# Patient Record
Sex: Female | Born: 1945 | Race: White | Hispanic: No | Smoking: Former smoker
Health system: Southern US, Community
[De-identification: ages and names within clinical notes are randomized; demographics above are authoritative.]

## PROBLEM LIST (undated history)

## (undated) DIAGNOSIS — M62830 Muscle spasm of back: Secondary | ICD-10-CM

## (undated) DIAGNOSIS — M1990 Unspecified osteoarthritis, unspecified site: Secondary | ICD-10-CM

## (undated) DIAGNOSIS — G8929 Other chronic pain: Secondary | ICD-10-CM

## (undated) DIAGNOSIS — F329 Major depressive disorder, single episode, unspecified: Secondary | ICD-10-CM

## (undated) DIAGNOSIS — Z973 Presence of spectacles and contact lenses: Secondary | ICD-10-CM

## (undated) DIAGNOSIS — I639 Cerebral infarction, unspecified: Secondary | ICD-10-CM

## (undated) DIAGNOSIS — M810 Age-related osteoporosis without current pathological fracture: Secondary | ICD-10-CM

## (undated) DIAGNOSIS — Z01810 Encounter for preprocedural cardiovascular examination: Secondary | ICD-10-CM

## (undated) DIAGNOSIS — I719 Aortic aneurysm of unspecified site, without rupture: Secondary | ICD-10-CM

## (undated) DIAGNOSIS — R0600 Dyspnea, unspecified: Secondary | ICD-10-CM

## (undated) DIAGNOSIS — I10 Essential (primary) hypertension: Secondary | ICD-10-CM

## (undated) DIAGNOSIS — N83209 Unspecified ovarian cyst, unspecified side: Secondary | ICD-10-CM

## (undated) DIAGNOSIS — F32A Depression, unspecified: Secondary | ICD-10-CM

## (undated) DIAGNOSIS — J3089 Other allergic rhinitis: Secondary | ICD-10-CM

## (undated) DIAGNOSIS — Z9989 Dependence on other enabling machines and devices: Secondary | ICD-10-CM

## (undated) DIAGNOSIS — I739 Peripheral vascular disease, unspecified: Secondary | ICD-10-CM

## (undated) DIAGNOSIS — IMO0002 Reserved for concepts with insufficient information to code with codable children: Secondary | ICD-10-CM

## (undated) DIAGNOSIS — J189 Pneumonia, unspecified organism: Secondary | ICD-10-CM

## (undated) DIAGNOSIS — E785 Hyperlipidemia, unspecified: Secondary | ICD-10-CM

## (undated) DIAGNOSIS — E119 Type 2 diabetes mellitus without complications: Secondary | ICD-10-CM

## (undated) DIAGNOSIS — G2581 Restless legs syndrome: Secondary | ICD-10-CM

## (undated) DIAGNOSIS — J439 Emphysema, unspecified: Secondary | ICD-10-CM

## (undated) DIAGNOSIS — F0390 Unspecified dementia without behavioral disturbance: Secondary | ICD-10-CM

## (undated) DIAGNOSIS — G709 Myoneural disorder, unspecified: Secondary | ICD-10-CM

## (undated) DIAGNOSIS — L601 Onycholysis: Secondary | ICD-10-CM

## (undated) DIAGNOSIS — M549 Dorsalgia, unspecified: Secondary | ICD-10-CM

## (undated) DIAGNOSIS — K439 Ventral hernia without obstruction or gangrene: Secondary | ICD-10-CM

## (undated) HISTORY — DX: Encounter for preprocedural cardiovascular examination: Z01.810

## (undated) HISTORY — DX: Unspecified ovarian cyst, unspecified side: N83.209

## (undated) HISTORY — DX: Emphysema, unspecified: J43.9

## (undated) HISTORY — PX: COLONOSCOPY: SHX174

## (undated) HISTORY — DX: Myoneural disorder, unspecified: G70.9

## (undated) HISTORY — PX: NECK SURGERY: SHX720

## (undated) HISTORY — DX: Age-related osteoporosis without current pathological fracture: M81.0

## (undated) HISTORY — DX: Hyperlipidemia, unspecified: E78.5

## (undated) HISTORY — DX: Other chronic pain: G89.29

## (undated) HISTORY — DX: Dorsalgia, unspecified: M54.9

## (undated) HISTORY — DX: Onycholysis: L60.1

## (undated) HISTORY — DX: Restless legs syndrome: G25.81

## (undated) HISTORY — DX: Muscle spasm of back: M62.830

## (undated) HISTORY — DX: Aortic aneurysm of unspecified site, without rupture: I71.9

---

## 1997-09-04 ENCOUNTER — Emergency Department (HOSPITAL_COMMUNITY): Admission: EM | Admit: 1997-09-04 | Discharge: 1997-09-04 | Payer: Self-pay | Admitting: Emergency Medicine

## 2002-05-15 ENCOUNTER — Ambulatory Visit (HOSPITAL_COMMUNITY): Admission: RE | Admit: 2002-05-15 | Discharge: 2002-05-15 | Payer: Self-pay | Admitting: Internal Medicine

## 2002-05-15 ENCOUNTER — Encounter: Payer: Self-pay | Admitting: Internal Medicine

## 2002-05-27 ENCOUNTER — Ambulatory Visit (HOSPITAL_COMMUNITY): Admission: RE | Admit: 2002-05-27 | Discharge: 2002-05-27 | Payer: Self-pay | Admitting: Internal Medicine

## 2002-05-27 ENCOUNTER — Encounter: Payer: Self-pay | Admitting: Internal Medicine

## 2003-10-09 ENCOUNTER — Ambulatory Visit: Payer: Self-pay | Admitting: *Deleted

## 2003-11-19 ENCOUNTER — Ambulatory Visit: Payer: Self-pay | Admitting: Nurse Practitioner

## 2003-12-08 ENCOUNTER — Ambulatory Visit: Payer: Self-pay | Admitting: Nurse Practitioner

## 2003-12-23 ENCOUNTER — Ambulatory Visit: Payer: Self-pay | Admitting: Nurse Practitioner

## 2004-04-08 ENCOUNTER — Ambulatory Visit (HOSPITAL_COMMUNITY): Admission: RE | Admit: 2004-04-08 | Discharge: 2004-04-08 | Payer: Self-pay | Admitting: *Deleted

## 2005-01-11 ENCOUNTER — Ambulatory Visit: Payer: Self-pay | Admitting: Nurse Practitioner

## 2005-02-08 ENCOUNTER — Ambulatory Visit (HOSPITAL_COMMUNITY): Admission: RE | Admit: 2005-02-08 | Discharge: 2005-02-08 | Payer: Self-pay | Admitting: Family Medicine

## 2005-03-21 ENCOUNTER — Encounter: Admission: RE | Admit: 2005-03-21 | Discharge: 2005-03-21 | Payer: Self-pay | Admitting: Nurse Practitioner

## 2005-09-06 ENCOUNTER — Ambulatory Visit: Payer: Self-pay | Admitting: Nurse Practitioner

## 2005-11-15 ENCOUNTER — Ambulatory Visit: Payer: Self-pay | Admitting: Internal Medicine

## 2005-11-16 ENCOUNTER — Encounter: Admission: RE | Admit: 2005-11-16 | Discharge: 2005-11-16 | Payer: Self-pay | Admitting: Internal Medicine

## 2006-05-29 ENCOUNTER — Ambulatory Visit: Payer: Self-pay | Admitting: Nurse Practitioner

## 2006-06-01 ENCOUNTER — Ambulatory Visit (HOSPITAL_COMMUNITY): Admission: RE | Admit: 2006-06-01 | Discharge: 2006-06-01 | Payer: Self-pay | Admitting: Family Medicine

## 2006-06-28 ENCOUNTER — Encounter (INDEPENDENT_AMBULATORY_CARE_PROVIDER_SITE_OTHER): Payer: Self-pay | Admitting: Nurse Practitioner

## 2006-06-28 ENCOUNTER — Ambulatory Visit: Payer: Self-pay | Admitting: Nurse Practitioner

## 2006-10-05 ENCOUNTER — Encounter: Admission: RE | Admit: 2006-10-05 | Discharge: 2006-10-05 | Payer: Self-pay | Admitting: Nurse Practitioner

## 2006-10-17 ENCOUNTER — Encounter (INDEPENDENT_AMBULATORY_CARE_PROVIDER_SITE_OTHER): Payer: Self-pay | Admitting: *Deleted

## 2006-12-26 ENCOUNTER — Ambulatory Visit: Payer: Self-pay | Admitting: Internal Medicine

## 2007-10-03 ENCOUNTER — Ambulatory Visit: Payer: Self-pay | Admitting: Internal Medicine

## 2007-10-03 ENCOUNTER — Encounter (INDEPENDENT_AMBULATORY_CARE_PROVIDER_SITE_OTHER): Payer: Self-pay | Admitting: Family Medicine

## 2007-10-03 LAB — CONVERTED CEMR LAB
Basophils Relative: 1 % (ref 0–1)
Creatinine, Ser: 0.78 mg/dL (ref 0.40–1.20)
Eosinophils Absolute: 0.1 10*3/uL (ref 0.0–0.7)
Eosinophils Relative: 1 % (ref 0–5)
Glucose, Bld: 90 mg/dL (ref 70–99)
Hemoglobin: 15.2 g/dL — ABNORMAL HIGH (ref 12.0–15.0)
Lymphocytes Relative: 30 % (ref 12–46)
Lymphs Abs: 2 10*3/uL (ref 0.7–4.0)
MCV: 96.1 fL (ref 78.0–100.0)
Monocytes Absolute: 0.6 10*3/uL (ref 0.1–1.0)
Monocytes Relative: 9 % (ref 3–12)
Platelets: 184 10*3/uL (ref 150–400)
RBC: 4.67 M/uL (ref 3.87–5.11)
Sodium: 142 meq/L (ref 135–145)
Vit D, 1,25-Dihydroxy: 31 (ref 30–89)
WBC: 6.9 10*3/uL (ref 4.0–10.5)

## 2007-10-17 ENCOUNTER — Ambulatory Visit (HOSPITAL_COMMUNITY): Admission: RE | Admit: 2007-10-17 | Discharge: 2007-10-17 | Payer: Self-pay | Admitting: Family Medicine

## 2007-11-07 ENCOUNTER — Ambulatory Visit: Payer: Self-pay | Admitting: Internal Medicine

## 2008-01-07 ENCOUNTER — Ambulatory Visit: Payer: Self-pay | Admitting: Internal Medicine

## 2008-01-28 ENCOUNTER — Ambulatory Visit: Payer: Self-pay | Admitting: Internal Medicine

## 2008-02-11 ENCOUNTER — Ambulatory Visit: Payer: Self-pay | Admitting: Internal Medicine

## 2008-02-11 ENCOUNTER — Encounter (INDEPENDENT_AMBULATORY_CARE_PROVIDER_SITE_OTHER): Payer: Self-pay | Admitting: Internal Medicine

## 2008-02-11 LAB — CONVERTED CEMR LAB
HDL: 67 mg/dL (ref >39)
LDL Cholesterol: 123 mg/dL — ABNORMAL HIGH (ref 0–99)
Total CHOL/HDL Ratio: 3.1
Triglycerides: 82 mg/dL (ref <150)
VLDL: 16 mg/dL (ref 0–40)

## 2008-04-07 ENCOUNTER — Encounter (INDEPENDENT_AMBULATORY_CARE_PROVIDER_SITE_OTHER): Payer: Self-pay | Admitting: Internal Medicine

## 2008-04-07 ENCOUNTER — Ambulatory Visit: Payer: Self-pay | Admitting: Family Medicine

## 2008-04-07 LAB — CONVERTED CEMR LAB
AST: 17 units/L (ref 0–37)
Albumin: 4.3 g/dL (ref 3.5–5.2)
Alkaline Phosphatase: 45 units/L (ref 39–117)
Calcium: 9.8 mg/dL (ref 8.4–10.5)
Glucose, Bld: 104 mg/dL — ABNORMAL HIGH (ref 70–99)
HDL: 61 mg/dL (ref >39)
Potassium: 4.2 meq/L (ref 3.5–5.3)
Sodium: 142 meq/L (ref 135–145)
Total Bilirubin: 0.5 mg/dL (ref 0.3–1.2)
Total CHOL/HDL Ratio: 2.6
Total Protein: 7 g/dL (ref 6.0–8.3)
VLDL: 19 mg/dL (ref 0–40)

## 2008-04-14 ENCOUNTER — Ambulatory Visit: Payer: Self-pay | Admitting: Internal Medicine

## 2008-06-16 ENCOUNTER — Ambulatory Visit: Payer: Self-pay | Admitting: Internal Medicine

## 2008-07-14 ENCOUNTER — Ambulatory Visit: Payer: Self-pay | Admitting: Internal Medicine

## 2008-08-31 ENCOUNTER — Ambulatory Visit: Payer: Self-pay | Admitting: Internal Medicine

## 2008-09-17 ENCOUNTER — Ambulatory Visit: Payer: Self-pay | Admitting: Internal Medicine

## 2008-10-20 ENCOUNTER — Encounter (INDEPENDENT_AMBULATORY_CARE_PROVIDER_SITE_OTHER): Payer: Self-pay | Admitting: Adult Health

## 2008-10-20 ENCOUNTER — Ambulatory Visit: Payer: Self-pay | Admitting: Internal Medicine

## 2008-10-20 LAB — CONVERTED CEMR LAB: Microalb, Ur: 2 mg/dL — ABNORMAL HIGH (ref 0.00–1.89)

## 2008-10-22 ENCOUNTER — Ambulatory Visit: Payer: Self-pay | Admitting: Internal Medicine

## 2008-10-22 ENCOUNTER — Encounter (INDEPENDENT_AMBULATORY_CARE_PROVIDER_SITE_OTHER): Payer: Self-pay | Admitting: Internal Medicine

## 2008-10-22 LAB — CONVERTED CEMR LAB
Calcium: 9.9 mg/dL (ref 8.4–10.5)
Vit D, 25-Hydroxy: 35 ng/mL (ref 30–89)

## 2008-11-03 ENCOUNTER — Ambulatory Visit: Payer: Self-pay | Admitting: Internal Medicine

## 2008-11-09 ENCOUNTER — Ambulatory Visit: Payer: Self-pay | Admitting: Internal Medicine

## 2008-11-09 ENCOUNTER — Encounter (INDEPENDENT_AMBULATORY_CARE_PROVIDER_SITE_OTHER): Payer: Self-pay | Admitting: Internal Medicine

## 2008-11-09 LAB — CONVERTED CEMR LAB
Basophils Absolute: 0 10*3/uL (ref 0.0–0.1)
Eosinophils Relative: 1 % (ref 0–5)
HCT: 45.5 % (ref 36.0–46.0)
Hemoglobin: 14.6 g/dL (ref 12.0–15.0)
Monocytes Absolute: 0.5 10*3/uL (ref 0.1–1.0)
Neutro Abs: 6.1 10*3/uL (ref 1.7–7.7)
Neutrophils Relative %: 70 % (ref 43–77)
Platelets: 187 10*3/uL (ref 150–400)

## 2008-11-24 ENCOUNTER — Ambulatory Visit (HOSPITAL_COMMUNITY): Admission: RE | Admit: 2008-11-24 | Discharge: 2008-11-24 | Payer: Self-pay | Admitting: Internal Medicine

## 2008-12-29 ENCOUNTER — Ambulatory Visit: Payer: Self-pay | Admitting: Internal Medicine

## 2009-01-19 ENCOUNTER — Ambulatory Visit: Payer: Self-pay | Admitting: Internal Medicine

## 2009-01-19 ENCOUNTER — Encounter (INDEPENDENT_AMBULATORY_CARE_PROVIDER_SITE_OTHER): Payer: Self-pay | Admitting: Internal Medicine

## 2009-01-19 LAB — CONVERTED CEMR LAB: LDL Cholesterol: 56 mg/dL (ref 0–99)

## 2009-01-28 ENCOUNTER — Ambulatory Visit: Payer: Self-pay | Admitting: Family Medicine

## 2009-01-30 DIAGNOSIS — I719 Aortic aneurysm of unspecified site, without rupture: Secondary | ICD-10-CM | POA: Insufficient documentation

## 2009-01-30 HISTORY — DX: Aortic aneurysm of unspecified site, without rupture: I71.9

## 2009-03-26 ENCOUNTER — Emergency Department (HOSPITAL_COMMUNITY): Admission: EM | Admit: 2009-03-26 | Discharge: 2009-03-26 | Payer: Self-pay | Admitting: Emergency Medicine

## 2009-03-29 ENCOUNTER — Ambulatory Visit: Payer: Self-pay | Admitting: Family Medicine

## 2009-03-31 ENCOUNTER — Ambulatory Visit (HOSPITAL_COMMUNITY): Admission: RE | Admit: 2009-03-31 | Discharge: 2009-03-31 | Payer: Self-pay | Admitting: Family Medicine

## 2009-04-08 ENCOUNTER — Ambulatory Visit: Payer: Self-pay | Admitting: Internal Medicine

## 2009-04-20 ENCOUNTER — Ambulatory Visit: Payer: Self-pay | Admitting: Vascular Surgery

## 2009-05-26 ENCOUNTER — Ambulatory Visit (HOSPITAL_COMMUNITY): Admission: RE | Admit: 2009-05-26 | Discharge: 2009-05-26 | Payer: Self-pay | Admitting: Internal Medicine

## 2009-06-01 DIAGNOSIS — N83209 Unspecified ovarian cyst, unspecified side: Secondary | ICD-10-CM

## 2009-06-01 HISTORY — DX: Unspecified ovarian cyst, unspecified side: N83.209

## 2009-06-10 ENCOUNTER — Ambulatory Visit: Payer: Self-pay | Admitting: Internal Medicine

## 2009-06-17 ENCOUNTER — Ambulatory Visit: Payer: Self-pay | Admitting: Internal Medicine

## 2009-06-17 LAB — CONVERTED CEMR LAB
CO2: 27 meq/L (ref 19–32)
Calcium: 9.7 mg/dL (ref 8.4–10.5)
Chloride: 101 meq/L (ref 96–112)
Creatinine, Ser: 0.85 mg/dL (ref 0.40–1.20)
Glucose, Bld: 105 mg/dL — ABNORMAL HIGH (ref 70–99)
Hgb A1c MFr Bld: 5.9 % — ABNORMAL HIGH (ref <5.7)

## 2009-06-30 ENCOUNTER — Ambulatory Visit (HOSPITAL_COMMUNITY): Admission: RE | Admit: 2009-06-30 | Discharge: 2009-06-30 | Payer: Self-pay | Admitting: Gastroenterology

## 2009-09-22 ENCOUNTER — Ambulatory Visit: Payer: Self-pay | Admitting: Family Medicine

## 2009-09-29 ENCOUNTER — Ambulatory Visit: Payer: Self-pay | Admitting: Internal Medicine

## 2009-12-07 ENCOUNTER — Ambulatory Visit: Payer: Self-pay | Admitting: Internal Medicine

## 2009-12-07 ENCOUNTER — Encounter (INDEPENDENT_AMBULATORY_CARE_PROVIDER_SITE_OTHER): Payer: Self-pay | Admitting: *Deleted

## 2009-12-07 LAB — CONVERTED CEMR LAB
ALT: 17 units/L (ref 0–35)
AST: 19 units/L (ref 0–37)
Albumin: 4.1 g/dL (ref 3.5–5.2)
Alkaline Phosphatase: 32 units/L — ABNORMAL LOW (ref 39–117)
Cholesterol: 141 mg/dL (ref 0–200)
Creatinine, Ser: 0.93 mg/dL (ref 0.40–1.20)
Total Bilirubin: 0.3 mg/dL (ref 0.3–1.2)
Total Protein: 6.4 g/dL (ref 6.0–8.3)

## 2010-02-19 ENCOUNTER — Encounter: Payer: Self-pay | Admitting: Internal Medicine

## 2010-04-22 LAB — CBC
HCT: 43.6 % (ref 36.0–46.0)
MCHC: 33 g/dL (ref 30.0–36.0)
MCV: 97.6 fL (ref 78.0–100.0)
Platelets: 179 10*3/uL (ref 150–400)
RDW: 13.6 % (ref 11.5–15.5)

## 2010-04-22 LAB — COMPREHENSIVE METABOLIC PANEL
ALT: 16 U/L (ref 0–35)
AST: 20 U/L (ref 0–37)
Albumin: 3.3 g/dL — ABNORMAL LOW (ref 3.5–5.2)
Alkaline Phosphatase: 33 U/L — ABNORMAL LOW (ref 39–117)
CO2: 27 mEq/L (ref 19–32)
Chloride: 103 mEq/L (ref 96–112)
GFR calc non Af Amer: >60 mL/min (ref >60)
Glucose, Bld: 106 mg/dL — ABNORMAL HIGH (ref 70–99)
Total Protein: 6.6 g/dL (ref 6.0–8.3)

## 2010-04-22 LAB — URINALYSIS, ROUTINE W REFLEX MICROSCOPIC
Bilirubin Urine: NEGATIVE
Glucose, UA: NEGATIVE mg/dL
Specific Gravity, Urine: 1.012 (ref 1.005–1.030)
pH: 7 (ref 5.0–8.0)

## 2010-04-22 LAB — DIFFERENTIAL
Basophils Absolute: 0.1 10*3/uL (ref 0.0–0.1)
Eosinophils Absolute: 0 10*3/uL (ref 0.0–0.7)
Eosinophils Relative: 0 % (ref 0–5)

## 2010-04-22 LAB — LIPASE, BLOOD: Lipase: 19 U/L (ref 11–59)

## 2010-06-14 NOTE — Consult Note (Signed)
NEW PATIENT CONSULTATION   Kylie Hurst, Kylie Hurst  DOB:  Jun 21, 1945                                       04/20/2009  XBJYN#:[id number]   The patient is a 65 year old female referred for a small abdominal  aortic aneurysm which was discovered on a recent CT scan for which she  was being evaluated for colitis and left flank discomfort in the [facility] emergency department.  These symptoms have resolved.  She states  that she was seen by Dr. Arbie Cookey about 6 years ago for an aneurysm at  which time the diameter was less than 3 cm.   CHRONIC MEDICAL PROBLEMS:  1. Lumbar disk disease.  2. Type 2 diabetes mellitus.  3. Hypertension.  4. Hyperlipidemia.  5. Tobacco abuse.  6. Colitis.   PAST SURGICAL HISTORY:  None.   FAMILY HISTORY:  Positive for diabetes in her mother and coronary artery  disease in her father who had coronary artery bypass grafting and  negative for stroke.   SOCIAL HISTORY:  She is a live in caregiver, has no children, has smoked  at least a pack of cigarettes per day for 44 years.  Does not use  alcohol.   REVIEW OF SYSTEMS:  Positive for chronic constipation, mild weight loss  recently.  No anorexia.  No chest pain.  Denies any neurologic or  musculoskeletal symptoms.  Has had a recent change in her vision.  All  other systems in review of systems are negative.   PHYSICAL EXAM:  Vital signs:  Blood pressure 120/76, heart rate 93,  temperature 97.9.  General:  She is a thin female who is in no apparent  distress, alert and oriented x3.  HEENT:  EOMs intact.  Conjunctivae  normal.  Lungs:  Clear to auscultation.  No rhonchi or wheezing.  Cardiovascular:  Regular rhythm.  No murmurs.  Carotid pulses 3+ with no  bruits.  There is no distal edema noted.  She has 3+ femoral, popliteal  and dorsalis pedis pulses bilaterally.  Abdomen:  Soft, nontender with  no masses.  Musculoskeletal:  Free of major deformities.  Neurological:  Normal with the  exception of some mild decreased sensation in her feet.  Skin:  Free of rashes.   I reviewed her CT of the abdomen performed on February 25 today and  agree that she has an infrarenal aortic aneurysm with a maximum diameter  of 37 mm.  There is no evidence of any leakage or dissection.  This  needs to be followed every 2 years with an ultrasound and we will  arrange for protocol followup in our office.  No treatment is necessary  at this time.  I discussed this at length with the patient.     Quita Skye Hart Rochester, M.D.  Electronically Signed   JDL/MEDQ  D:  04/20/2009  T:  04/21/2009  Job:  [id number]   cc:   Dr Brita Romp C. Reche Dixon, M.D.

## 2011-04-03 ENCOUNTER — Other Ambulatory Visit: Payer: Self-pay | Admitting: *Deleted

## 2011-04-03 DIAGNOSIS — I714 Abdominal aortic aneurysm, without rupture: Secondary | ICD-10-CM

## 2011-06-13 ENCOUNTER — Encounter (INDEPENDENT_AMBULATORY_CARE_PROVIDER_SITE_OTHER): Payer: Medicare Other | Admitting: *Deleted

## 2011-06-13 DIAGNOSIS — I714 Abdominal aortic aneurysm, without rupture: Secondary | ICD-10-CM

## 2011-06-28 ENCOUNTER — Other Ambulatory Visit: Payer: Self-pay | Admitting: *Deleted

## 2011-06-28 ENCOUNTER — Encounter: Payer: Self-pay | Admitting: Vascular Surgery

## 2011-06-28 DIAGNOSIS — I714 Abdominal aortic aneurysm, without rupture: Secondary | ICD-10-CM

## 2011-06-28 NOTE — Procedures (Unsigned)
DUPLEX ULTRASOUND OF ABDOMINAL AORTA  INDICATION:  Followup AAA.  HISTORY: Diabetes:  Yes Cardiac:  No Hypertension:  Yes Smoking:  Yes Connective Tissue Disorder: Family History:  No Previous Surgery:  No  DUPLEX EXAM:         AP (cm)                   TRANSVERSE (cm) Proximal             2.03 cm                   2.03 cm Mid                  3.7 cm                    3.7 cm Distal               NV                        NV Right Iliac          0.86 cm Left Iliac           0.81 cm  Previous:  (by CT) :  3.7 cm AP  IMPRESSION:  Apparently stable AAA, measuring 3.7 cm x 3.7 cm on today's exam.  ___________________________________________ Quita Skye Hart [place], M.D.  LT/MEDQ  D:  06/13/2011  T:  06/14/2011  Job:  [id number]

## 2011-07-08 ENCOUNTER — Ambulatory Visit (INDEPENDENT_AMBULATORY_CARE_PROVIDER_SITE_OTHER): Payer: Medicaid Other | Admitting: Family Medicine

## 2011-07-08 ENCOUNTER — Ambulatory Visit: Payer: Medicare Other

## 2011-07-08 VITALS — BP 127/79 | HR 83 | Temp 98.2°F | Resp 18 | Ht <= 58 in | Wt 119.2 lb

## 2011-07-08 DIAGNOSIS — F17200 Nicotine dependence, unspecified, uncomplicated: Secondary | ICD-10-CM

## 2011-07-08 DIAGNOSIS — J40 Bronchitis, not specified as acute or chronic: Secondary | ICD-10-CM

## 2011-07-08 MED ORDER — AMOXICILLIN 875 MG PO TABS: 875.0000 mg | ORAL_TABLET | Freq: Two times a day (BID) | ORAL | Status: AC

## 2011-07-08 MED ORDER — BENZONATATE 100 MG PO CAPS: 100.0000 mg | ORAL_CAPSULE | Freq: Two times a day (BID) | ORAL | Status: AC | PRN

## 2011-07-08 NOTE — Progress Notes (Signed)
Subjective: Is 66-year-old lady with a history of being sick for the female patient in no major distress at this time. ast couple days with a cough. She bringing up a lot of green sputum. She has been a smoker for a long time, now smoking about a pack of cigarettes a day. She has not been terribly ill with this. Not running a fever. She has not been around anyone else with similar symptoms. She does have diabetes, is on Januvia. She is also on medicine for her osteoporosis, cholesterol, blood pressure, muscle relaxant, and pain.  Objective:  TMs normal. Has some cerumen buildup in the right ear. Throat clear. Neck supple without significant nodes. Chest has somewhat decreased air exchanged. There is a tiny in expiratory wheeze on forced expiration. Heart regular without murmurs. No ankle edema  Assessment: Bronchitis Cigarette smoker Diabetes  Plan: Check chest x-ray  [facility] reading (PRIMARY) by  Dr. Alwyn Ren Copd . Urged patient to quit smoking. Discussed with her the methodology for doing so.

## 2011-07-08 NOTE — Patient Instructions (Addendum)
STOP SMOKING!  If worse cough, shortness of breath, or illness return for recheck.

## 2011-12-20 ENCOUNTER — Other Ambulatory Visit (HOSPITAL_COMMUNITY): Payer: Self-pay | Admitting: Specialist

## 2011-12-20 DIAGNOSIS — Z1231 Encounter for screening mammogram for malignant neoplasm of breast: Secondary | ICD-10-CM

## 2011-12-24 ENCOUNTER — Emergency Department (HOSPITAL_COMMUNITY): Payer: Medicare Other

## 2011-12-24 ENCOUNTER — Encounter (HOSPITAL_COMMUNITY): Payer: Self-pay | Admitting: Emergency Medicine

## 2011-12-24 ENCOUNTER — Emergency Department (HOSPITAL_COMMUNITY)
Admission: EM | Admit: 2011-12-24 | Discharge: 2011-12-24 | Disposition: A | Discharge status: Home / Self Care | Payer: Medicare Other | Attending: Emergency Medicine | Admitting: Emergency Medicine

## 2011-12-24 DIAGNOSIS — Z760 Encounter for issue of repeat prescription: Secondary | ICD-10-CM | POA: Insufficient documentation

## 2011-12-24 DIAGNOSIS — J40 Bronchitis, not specified as acute or chronic: Secondary | ICD-10-CM | POA: Insufficient documentation

## 2011-12-24 DIAGNOSIS — I10 Essential (primary) hypertension: Secondary | ICD-10-CM | POA: Insufficient documentation

## 2011-12-24 DIAGNOSIS — J3489 Other specified disorders of nose and nasal sinuses: Secondary | ICD-10-CM | POA: Insufficient documentation

## 2011-12-24 DIAGNOSIS — E119 Type 2 diabetes mellitus without complications: Secondary | ICD-10-CM | POA: Insufficient documentation

## 2011-12-24 DIAGNOSIS — F17200 Nicotine dependence, unspecified, uncomplicated: Secondary | ICD-10-CM | POA: Insufficient documentation

## 2011-12-24 DIAGNOSIS — Z7982 Long term (current) use of aspirin: Secondary | ICD-10-CM | POA: Insufficient documentation

## 2011-12-24 DIAGNOSIS — Z79899 Other long term (current) drug therapy: Secondary | ICD-10-CM | POA: Insufficient documentation

## 2011-12-24 HISTORY — DX: Essential (primary) hypertension: I10

## 2011-12-24 HISTORY — DX: Type 2 diabetes mellitus without complications: E11.9

## 2011-12-24 MED ORDER — DOXYCYCLINE HYCLATE 100 MG PO CAPS: 100.0000 mg | ORAL_CAPSULE | Freq: Two times a day (BID) | ORAL | Status: DC

## 2011-12-24 MED ORDER — BENZONATATE 100 MG PO CAPS: 100.0000 mg | ORAL_CAPSULE | Freq: Three times a day (TID) | ORAL | Status: DC | PRN

## 2011-12-24 NOTE — ED Provider Notes (Signed)
History     CSN: [id number]  Arrival date & time 12/24/11  1641   First MD Initiated Contact with Patient 12/24/11 1804      Chief Complaint  Patient presents with  . Cough  . Nasal Congestion    (Consider location/radiation/quality/duration/timing/severity/associated sxs/prior treatment) HPI  Patient reports 4 days ago she started having nasal stuffiness then a scratchy throat and now she is having a cough with yellow sputum production. She denies fever, shortness of breath, wheezing, or chest pain. No DOE.  She states when she coughs her abdominal muscles are sore. She denies nausea, vomiting or diarrhea. States she has been taking Robitussin that her sister gave her.  PCP Dr. Lerry Liner  Past Medical History  Diagnosis Date  . Diabetes mellitus without complication   . Hypertension     History reviewed. No pertinent past surgical history.  No family history on file.  History  Substance Use Topics  . Smoking status: Current Every Day Smoker -- 1.0 packs/day for 47 years    Types: Cigarettes  . Smokeless tobacco: Never Used  . Alcohol Use: No  Lives at home Lives alone retired  OB History    Grav Para Term Preterm Abortions TAB SAB Ect Mult Living                  Review of Systems  All other systems reviewed and are negative.    Allergies  Review of patient's allergies indicates no known allergies.  Home Medications   Current Outpatient Rx  Name  Route  Sig  Dispense  Refill  . ACETAMINOPHEN 500 MG PO TABS   Oral   Take 500 mg by mouth 3 (three) times daily.         . ALENDRONATE SODIUM 70 MG PO TABS   Oral   Take 70 mg by mouth every 7 (seven) days. Take with a full glass of water on an empty stomach.         Marland Kitchen VITAMIN C 1000 MG PO TABS   Oral   Take 1,000 mg by mouth daily.         . ASPIRIN 81 MG PO TABS   Oral   Take 81 mg by mouth daily.         Marland Kitchen CALCIUM CARBONATE-VITAMIN D 500-200 MG-UNIT PO TABS   Oral   Take 1  tablet by mouth 2 (two) times daily.         Marland Kitchen CETIRIZINE HCL 10 MG PO TABS   Oral   Take 10 mg by mouth daily.         . CYCLOBENZAPRINE HCL 10 MG PO TABS   Oral   Take 10 mg by mouth 3 (three) times daily as needed.         Marland Kitchen DOCUSATE SODIUM 250 MG PO CAPS   Oral   Take 250 mg by mouth daily.         Marland Kitchen HYDROCHLOROTHIAZIDE 25 MG PO TABS   Oral   Take 12.5 mg by mouth daily.          Marland Kitchen LISINOPRIL 5 MG PO TABS   Oral   Take 5 mg by mouth daily.         . MULTI-VITAMIN/MINERALS PO TABS   Oral   Take 1 tablet by mouth daily.         Marland Kitchen NAPROXEN 500 MG PO TABS   Oral   Take 500 mg by mouth 2 (two)  times daily with a meal.         . PRAVASTATIN SODIUM 20 MG PO TABS   Oral   Take 20 mg by mouth daily.         Marland Kitchen VITAMIN B-6 100 MG PO TABS   Oral   Take 100 mg by mouth daily.         Marland Kitchen SITAGLIPTIN PHOSPHATE 50 MG PO TABS   Oral   Take 50 mg by mouth daily.         . TRAMADOL HCL 50 MG PO TABS   Oral   Take 50 mg by mouth every 8 (eight) hours as needed.           BP 119/69  Pulse 90  Temp 99.8 F (37.7 C) (Oral)  Resp 18  SpO2 96%  Vital signs normal    Physical Exam  Nursing note and vitals reviewed. Constitutional: She is oriented to person, place, and time. She appears well-developed and well-nourished.  Non-toxic appearance. She does not appear ill. No distress.       Coughs at times  HENT:  Head: Normocephalic and atraumatic.  Right Ear: External ear normal.  Left Ear: External ear normal.  Nose: Nose normal. No mucosal edema or rhinorrhea.  Mouth/Throat: Oropharynx is clear and moist and mucous membranes are normal. No dental abscesses or uvula swelling.  Eyes: Conjunctivae normal and EOM are normal. Pupils are equal, round, and reactive to light.  Neck: Normal range of motion and full passive range of motion without pain. Neck supple.  Cardiovascular: Normal rate, regular rhythm and normal heart sounds.  Exam reveals no  gallop and no friction rub.   No murmur heard. Pulmonary/Chest: Effort normal and breath sounds normal. No respiratory distress. She has no wheezes. She has no rhonchi. She has no rales. She exhibits no tenderness and no crepitus.       Good air movement  Abdominal: Soft. Normal appearance and bowel sounds are normal. She exhibits no distension. There is no tenderness. There is no rebound and no guarding.  Musculoskeletal: Normal range of motion. She exhibits no edema and no tenderness.       Moves all extremities well.   Neurological: She is alert and oriented to person, place, and time. She has normal strength. No cranial nerve deficit.  Skin: Skin is warm, dry and intact. No rash noted. No erythema. No pallor.  Psychiatric: She has a normal mood and affect. Her speech is normal and behavior is normal. Her mood appears not anxious.    ED Course  Procedures (including critical care time)  Patient concerned that she has changes of emphysema on her chest x-ray. We discussed trying to quit smoking.  Dg Chest 2 View  12/24/2011  *RADIOLOGY REPORT*  Clinical Data: Cough, congestion and fever.  CHEST - 2 VIEW  Comparison: PA and lateral chest 07/08/2011.  Findings: The lungs are emphysematous with basilar scar.  No consolidative process, pneumothorax or effusion.  Heart size normal.  IMPRESSION: Emphysema without acute disease.   Original Report Authenticated By: Holley Dexter, M.D.      1. Bronchitis     New Prescriptions   BENZONATATE (TESSALON) 100 MG CAPSULE    Take 1 capsule (100 mg total) by mouth 3 (three) times daily as needed for cough (DO NOT CHEW!!!).   DOXYCYCLINE (VIBRAMYCIN) 100 MG CAPSULE    Take 1 capsule (100 mg total) by mouth 2 (two) times daily.    Plan discharge  Devoria Albe, MD, FACEP   MDM          Ward Givens, MD 12/24/11 [phone]

## 2011-12-24 NOTE — ED Notes (Signed)
Presents w/ nasal congestion, onset Wednesday, has now progressed to productive (milky yellow) cough, w/ low grade temp. Pt denies headache, sorethroat or ear pain. Is having abdominal pain when coughing.

## 2011-12-24 NOTE — ED Notes (Signed)
MD at bedside. Dr. Knapp at bedside.  

## 2012-01-10 ENCOUNTER — Ambulatory Visit (HOSPITAL_COMMUNITY)
Admission: RE | Admit: 2012-01-10 | Discharge: 2012-01-10 | Disposition: A | Discharge status: Home / Self Care | Payer: Medicare Other | Source: Ambulatory Visit | Attending: Specialist | Admitting: Specialist

## 2012-01-10 DIAGNOSIS — Z1231 Encounter for screening mammogram for malignant neoplasm of breast: Secondary | ICD-10-CM

## 2012-04-04 ENCOUNTER — Other Ambulatory Visit (HOSPITAL_COMMUNITY): Payer: Self-pay | Admitting: Specialist

## 2012-04-04 DIAGNOSIS — N83201 Unspecified ovarian cyst, right side: Secondary | ICD-10-CM

## 2012-04-09 ENCOUNTER — Other Ambulatory Visit (HOSPITAL_COMMUNITY): Payer: Self-pay | Admitting: Specialist

## 2012-04-09 ENCOUNTER — Ambulatory Visit (HOSPITAL_COMMUNITY)
Admission: RE | Admit: 2012-04-09 | Discharge: 2012-04-09 | Disposition: A | Discharge status: Home / Self Care | Payer: Medicare Other | Source: Ambulatory Visit | Attending: Specialist | Admitting: Specialist

## 2012-04-09 DIAGNOSIS — N83201 Unspecified ovarian cyst, right side: Secondary | ICD-10-CM

## 2012-04-09 DIAGNOSIS — R109 Unspecified abdominal pain: Secondary | ICD-10-CM | POA: Insufficient documentation

## 2012-04-09 DIAGNOSIS — Z09 Encounter for follow-up examination after completed treatment for conditions other than malignant neoplasm: Secondary | ICD-10-CM | POA: Insufficient documentation

## 2012-04-09 DIAGNOSIS — N83209 Unspecified ovarian cyst, unspecified side: Secondary | ICD-10-CM | POA: Insufficient documentation

## 2012-04-19 ENCOUNTER — Other Ambulatory Visit (HOSPITAL_COMMUNITY): Payer: Self-pay | Admitting: Specialist

## 2012-04-19 DIAGNOSIS — N83209 Unspecified ovarian cyst, unspecified side: Secondary | ICD-10-CM

## 2012-04-23 ENCOUNTER — Ambulatory Visit (HOSPITAL_COMMUNITY): Payer: Medicare Other

## 2012-04-30 ENCOUNTER — Ambulatory Visit (HOSPITAL_COMMUNITY)
Admission: RE | Admit: 2012-04-30 | Discharge: 2012-04-30 | Disposition: A | Discharge status: Home / Self Care | Payer: 59 | Source: Ambulatory Visit | Attending: Specialist | Admitting: Specialist

## 2012-04-30 ENCOUNTER — Encounter (HOSPITAL_COMMUNITY): Payer: Self-pay

## 2012-04-30 DIAGNOSIS — N949 Unspecified condition associated with female genital organs and menstrual cycle: Secondary | ICD-10-CM | POA: Insufficient documentation

## 2012-04-30 DIAGNOSIS — I714 Abdominal aortic aneurysm, without rupture, unspecified: Secondary | ICD-10-CM | POA: Insufficient documentation

## 2012-04-30 DIAGNOSIS — K439 Ventral hernia without obstruction or gangrene: Secondary | ICD-10-CM | POA: Insufficient documentation

## 2012-04-30 DIAGNOSIS — N83209 Unspecified ovarian cyst, unspecified side: Secondary | ICD-10-CM

## 2012-04-30 MED ORDER — IOHEXOL 300 MG/ML  SOLN
100.0000 mL | Freq: Once | INTRAMUSCULAR | Status: AC | PRN
  Administered 2012-04-30: 100 mL via INTRAVENOUS

## 2012-05-14 ENCOUNTER — Encounter (INDEPENDENT_AMBULATORY_CARE_PROVIDER_SITE_OTHER): Payer: Self-pay | Admitting: General Surgery

## 2012-05-14 ENCOUNTER — Ambulatory Visit (INDEPENDENT_AMBULATORY_CARE_PROVIDER_SITE_OTHER): Payer: Medicare Other | Admitting: General Surgery

## 2012-05-14 VITALS — BP 146/74 | HR 82 | Resp 18 | Ht <= 58 in | Wt 118.0 lb

## 2012-05-14 DIAGNOSIS — K439 Ventral hernia without obstruction or gangrene: Secondary | ICD-10-CM

## 2012-05-14 NOTE — Progress Notes (Signed)
Patient ID: Kylie Hurst, female   DOB: Apr 11, 1945, 67 y.o.   MRN: [id number]  Chief Complaint  Patient presents with  . Other    Eval Ventral Hernia    HPI Kylie Hurst is a 67 y.o. female.  This patient was referred by Dr. Mayford Knife for evaluation of a left lower quadrant abdominal bulge and ventral hernia. She says that she first noticed this about 2 years ago but it has not caused any discomfort. She says that the knot was the size of a baseball and comes and goes and reduces spontaneously when lying flat.  It is not causing discomfort and she has no associated nausea or vomiting. She moves her bowels daily. She also has a known AAA which is just been followed with imaging for now. She continues to smoke. HPI  Past Medical History  Diagnosis Date  . Diabetes mellitus without complication   . Hypertension   . Aortic aneurysm 2011    3.54mm  . Ovarian cyst 06/01/2009    5 cm  . Onycholysis of toenail   . Osteoporosis   . Chronic back pain   . Restless legs   . Back spasm     No past surgical history on file.  No family history on file.  Social History History  Substance Use Topics  . Smoking status: Current Every Day Smoker -- 1.00 packs/day for 47 years    Types: Cigarettes  . Smokeless tobacco: Never Used  . Alcohol Use: No    No Known Allergies  Current Outpatient Prescriptions  Medication Sig Dispense Refill  . acetaminophen (TYLENOL) 500 MG tablet Take 500 mg by mouth 3 (three) times daily.      Marland Kitchen alendronate (FOSAMAX) 70 MG tablet Take 70 mg by mouth every 7 (seven) days. Take with a full glass of water on an empty stomach.      . Ascorbic Acid (VITAMIN C) 1000 MG tablet Take 1,000 mg by mouth daily.      Marland Kitchen aspirin 81 MG tablet Take 81 mg by mouth daily.      . benzonatate (TESSALON) 100 MG capsule Take 1 capsule (100 mg total) by mouth 3 (three) times daily as needed for cough (DO NOT CHEW!!!).  21 capsule  0  . calcium-vitamin D (OSCAL WITH D) 500-200 MG-UNIT  per tablet Take 1 tablet by mouth 2 (two) times daily.      . cyclobenzaprine (FLEXERIL) 10 MG tablet Take 10 mg by mouth 3 (three) times daily as needed.      . docusate sodium (COLACE) 250 MG capsule Take 250 mg by mouth daily.      . hydrochlorothiazide (HYDRODIURIL) 25 MG tablet Take 12.5 mg by mouth daily.       Marland Kitchen lisinopril (PRINIVIL,ZESTRIL) 5 MG tablet Take 5 mg by mouth daily.      . Multiple Vitamins-Minerals (MULTIVITAMIN WITH MINERALS) tablet Take 1 tablet by mouth daily.      . naproxen (NAPROSYN) 500 MG tablet Take 500 mg by mouth 2 (two) times daily with a meal.      . pravastatin (PRAVACHOL) 20 MG tablet Take 20 mg by mouth daily.      Marland Kitchen pyridOXINE (VITAMIN B-6) 100 MG tablet Take 100 mg by mouth daily.      . sitaGLIPtin (JANUVIA) 50 MG tablet Take 50 mg by mouth daily.      . traMADol (ULTRAM) 50 MG tablet Take 50 mg by mouth every 8 (eight) hours as needed.  No current facility-administered medications for this visit.    Review of Systems Review of Systems All other review of systems negative or noncontributory except as stated in the HPI  Blood pressure 146/74, pulse 82, resp. rate 18, height 4' 9.5" (1.461 m), weight 118 lb (53.524 kg).  Physical Exam Physical Exam Physical Exam  Nursing note and vitals reviewed. Constitutional: She is oriented to person, place, and time. She appears well-developed and well-nourished. No distress.  HENT:  Head: Normocephalic and atraumatic.  Mouth/Throat: No oropharyngeal exudate.  Eyes: Conjunctivae and EOM are normal. Pupils are equal, round, and reactive to light. Right eye exhibits no discharge. Left eye exhibits no discharge. No scleral icterus.  Neck: Normal range of motion. Neck supple. No tracheal deviation present.  Cardiovascular: Normal rate, regular rhythm, normal heart sounds and intact distal pulses.   Pulmonary/Chest: Effort normal and breath sounds normal. No stridor. No respiratory distress. She has no  wheezes.  Abdominal: Soft. Bowel sounds are normal. She exhibits no distension. There is no tenderness. There is no rebound and no guarding. Reducible LLQ bulge, likely spigelian hernia.  Does not appear to be an inguinal hernia. Musculoskeletal: Normal range of motion. She exhibits no edema and no tenderness.  Neurological: She is alert and oriented to person, place, and time.  Skin: Skin is warm and dry. No rash noted. She is not diaphoretic. No erythema. No pallor.  Psychiatric: She has a normal mood and affect. Her behavior is normal. Judgment and thought content normal.    Data Reviewed [place], ct  Assessment    Left lower quadrant ventral hernia-reducible She has an asymptomatic and reducible ventral hernia which is likely a spigelian hernia given its location and the lack of prior abdominal surgery. I do not think that this is an inguinal hernia. She remains asymptomatic. I discussed with her the option for ventral hernia repair and I think that she would be best served with an open preperitoneal ventral hernia repair with mesh however she is a current smoker and we discussed the risks of the surgery and explained that she is high risk for complications due to her smoking. Since she is asymptomatic I recommended that she attempt smoking cessation prior to repair.  I did offer repair of if this is symptomatic for any signs of incarceration or obstruction, however, again since this is asymptomatic currently I think it would be best to receive her smoking cessation prior to elective repair. I did discuss with her the risk of possible incarceration and strangulation and the possible need for emergent surgery and she expressed understanding of this but she agreed to plan an she is planning on contacting her primary physician to evaluate for possible smoking cessation aids.  I gave her my card that information if this becomes symptomatic or any signs of incarceration or obstruction, she will call me back  and we can discuss scheduling her for repair sooner    Plan    Smoking cessation I will see her back in one month to see her she is doing and to discuss operative repair again        Lodema Pilot DAVID 05/14/2012, 10:02 AM

## 2012-05-22 ENCOUNTER — Encounter (INDEPENDENT_AMBULATORY_CARE_PROVIDER_SITE_OTHER): Payer: Self-pay

## 2012-06-11 ENCOUNTER — Ambulatory Visit: Payer: Medicare Other | Admitting: Neurosurgery

## 2012-06-14 ENCOUNTER — Encounter (INDEPENDENT_AMBULATORY_CARE_PROVIDER_SITE_OTHER): Payer: Medicaid Other | Admitting: *Deleted

## 2012-06-14 ENCOUNTER — Encounter (INDEPENDENT_AMBULATORY_CARE_PROVIDER_SITE_OTHER): Payer: Medicare Other | Admitting: General Surgery

## 2012-06-14 DIAGNOSIS — I714 Abdominal aortic aneurysm, without rupture: Secondary | ICD-10-CM

## 2012-06-18 ENCOUNTER — Other Ambulatory Visit: Payer: Self-pay | Admitting: *Deleted

## 2012-06-18 DIAGNOSIS — I714 Abdominal aortic aneurysm, without rupture: Secondary | ICD-10-CM

## 2012-06-21 ENCOUNTER — Encounter (INDEPENDENT_AMBULATORY_CARE_PROVIDER_SITE_OTHER): Payer: Medicare Other | Admitting: General Surgery

## 2012-07-01 ENCOUNTER — Encounter: Payer: Self-pay | Admitting: Vascular Surgery

## 2012-07-02 ENCOUNTER — Ambulatory Visit (INDEPENDENT_AMBULATORY_CARE_PROVIDER_SITE_OTHER): Payer: Medicare Other | Admitting: Vascular Surgery

## 2012-07-02 ENCOUNTER — Encounter: Payer: Self-pay | Admitting: Vascular Surgery

## 2012-07-02 ENCOUNTER — Other Ambulatory Visit: Payer: Self-pay | Admitting: *Deleted

## 2012-07-02 VITALS — BP 132/87 | HR 83 | Resp 16 | Ht <= 58 in | Wt 118.0 lb

## 2012-07-02 DIAGNOSIS — I714 Abdominal aortic aneurysm, without rupture: Secondary | ICD-10-CM

## 2012-07-03 DIAGNOSIS — I714 Abdominal aortic aneurysm, without rupture, unspecified: Secondary | ICD-10-CM

## 2012-07-03 HISTORY — DX: Abdominal aortic aneurysm, without rupture, unspecified: I71.40

## 2012-07-03 NOTE — Progress Notes (Signed)
Subjective:     Patient ID: Kylie Hurst, female   DOB: 1945/06/27, 67 y.o.   MRN: [id number]  HPI this 67 year old female was seen for followup regarding abdominal aortic aneurysm. She was seen 2 years ago with the aneurysm and 3.7 cm range. She had an ultrasound performed in April of this year which revealed the aneurysm to be 4.1 cm. Today she returns for followup. She denies any abdominal or back symptoms.   Past Medical History  Diagnosis Date  . Diabetes mellitus without complication   . Hypertension   . Aortic aneurysm 2011    3.39mm  . Ovarian cyst 06/01/2009    5 cm  . Onycholysis of toenail   . Osteoporosis   . Chronic back pain   . Restless legs   . Back spasm   . Emphysema     History  Substance Use Topics  . Smoking status: Former Smoker -- 1.00 packs/day for 47 years    Types: Cigarettes    Quit date: 05/30/2012  . Smokeless tobacco: Never Used  . Alcohol Use: No    History reviewed. No pertinent family history.  No Known Allergies  Current outpatient prescriptions:acetaminophen (TYLENOL) 500 MG tablet, Take 500 mg by mouth 3 (three) times daily., Disp: , Rfl: ;  alendronate (FOSAMAX) 70 MG tablet, Take 70 mg by mouth every 7 (seven) days. Take with a full glass of water on an empty stomach., Disp: , Rfl: ;  Ascorbic Acid (VITAMIN C) 1000 MG tablet, Take 2,000 mg by mouth daily. , Disp: , Rfl: ;  aspirin 81 MG tablet, Take 81 mg by mouth daily., Disp: , Rfl:  calcium-vitamin D (OSCAL WITH D) 500-200 MG-UNIT per tablet, Take 1 tablet by mouth 2 (two) times daily., Disp: , Rfl: ;  cyclobenzaprine (FLEXERIL) 10 MG tablet, Take 10 mg by mouth 3 (three) times daily as needed., Disp: , Rfl: ;  docusate sodium (COLACE) 250 MG capsule, Take 500 mg by mouth daily. , Disp: , Rfl: ;  hydrochlorothiazide (HYDRODIURIL) 25 MG tablet, Take 12.5 mg by mouth daily. , Disp: , Rfl:  lisinopril (PRINIVIL,ZESTRIL) 5 MG tablet, Take 5 mg by mouth daily., Disp: , Rfl: ;  Multiple  Vitamins-Minerals (MULTIVITAMIN WITH MINERALS) tablet, Take 1 tablet by mouth daily., Disp: , Rfl: ;  naproxen (NAPROSYN) 500 MG tablet, Take 500 mg by mouth 2 (two) times daily with a meal., Disp: , Rfl: ;  pravastatin (PRAVACHOL) 20 MG tablet, Take 20 mg by mouth daily., Disp: , Rfl:  pyridOXINE (VITAMIN B-6) 100 MG tablet, Take 100 mg by mouth daily., Disp: , Rfl: ;  sitaGLIPtin (JANUVIA) 50 MG tablet, Take 50 mg by mouth daily., Disp: , Rfl: ;  traMADol (ULTRAM) 50 MG tablet, Take 50 mg by mouth every 8 (eight) hours as needed., Disp: , Rfl: ;  benzonatate (TESSALON) 100 MG capsule, Take 1 capsule (100 mg total) by mouth 3 (three) times daily as needed for cough (DO NOT CHEW!!!)., Disp: 21 capsule, Rfl: 0  BP 132/87  Pulse 83  Resp 16  Ht 4' 9.5" (1.461 m)  Wt 118 lb (53.524 kg)  BMI 25.08 kg/m2  Body mass index is 25.08 kg/(m^2).        Review of Systems denies chest pain but does have dyspnea on exertion, back pain, joint pain. No hemoptysis, PND, orthopnea.    Objective:   Physical Exam blood pressure 132/87 heart rate 83 respirations 16 Gen.-alert and oriented x3 in no apparent distress  HEENT normal for age Lungs no rhonchi or wheezing Cardiovascular regular rhythm no murmurs carotid pulses 3+ palpable no bruits audible Abdomen soft nontender no palpable masses Musculoskeletal free of  major deformities Skin clear -no rashes Neurologic normal Lower extremities 3+ femoral and dorsalis pedis pulses palpable bilaterally with no edema  Today I ordered a duplex scan of the aortic aneurysm for comparison sake. Our measurements were 3.7 cm maximum diameter of the aortic aneurysm.       Assessment:     3.7 cm abdominal aortic aneurysm-stable-asymptomatic    Plan:     Return in 2 years for repeat duplex scan of abdominal aortic aneurysm

## 2012-11-05 ENCOUNTER — Encounter (HOSPITAL_COMMUNITY): Payer: Self-pay | Admitting: *Deleted

## 2012-11-05 ENCOUNTER — Emergency Department (HOSPITAL_COMMUNITY)
Admission: EM | Admit: 2012-11-05 | Discharge: 2012-11-05 | Disposition: A | Discharge status: Home / Self Care | Payer: Medicare Other | Attending: Emergency Medicine | Admitting: Emergency Medicine

## 2012-11-05 DIAGNOSIS — Z87891 Personal history of nicotine dependence: Secondary | ICD-10-CM | POA: Insufficient documentation

## 2012-11-05 DIAGNOSIS — I10 Essential (primary) hypertension: Secondary | ICD-10-CM | POA: Insufficient documentation

## 2012-11-05 DIAGNOSIS — G8929 Other chronic pain: Secondary | ICD-10-CM | POA: Insufficient documentation

## 2012-11-05 DIAGNOSIS — M545 Low back pain, unspecified: Secondary | ICD-10-CM | POA: Insufficient documentation

## 2012-11-05 DIAGNOSIS — Z7982 Long term (current) use of aspirin: Secondary | ICD-10-CM | POA: Insufficient documentation

## 2012-11-05 DIAGNOSIS — J438 Other emphysema: Secondary | ICD-10-CM | POA: Insufficient documentation

## 2012-11-05 DIAGNOSIS — R209 Unspecified disturbances of skin sensation: Secondary | ICD-10-CM | POA: Insufficient documentation

## 2012-11-05 DIAGNOSIS — M5430 Sciatica, unspecified side: Secondary | ICD-10-CM | POA: Insufficient documentation

## 2012-11-05 DIAGNOSIS — Z79899 Other long term (current) drug therapy: Secondary | ICD-10-CM | POA: Insufficient documentation

## 2012-11-05 DIAGNOSIS — E119 Type 2 diabetes mellitus without complications: Secondary | ICD-10-CM | POA: Insufficient documentation

## 2012-11-05 DIAGNOSIS — Z8742 Personal history of other diseases of the female genital tract: Secondary | ICD-10-CM | POA: Insufficient documentation

## 2012-11-05 DIAGNOSIS — M5431 Sciatica, right side: Secondary | ICD-10-CM

## 2012-11-05 DIAGNOSIS — M810 Age-related osteoporosis without current pathological fracture: Secondary | ICD-10-CM | POA: Insufficient documentation

## 2012-11-05 HISTORY — DX: Reserved for concepts with insufficient information to code with codable children: IMO0002

## 2012-11-05 MED ORDER — HYDROCODONE-ACETAMINOPHEN 5-325 MG PO TABS
2.0000 | ORAL_TABLET | Freq: Once | ORAL | Status: AC
  Administered 2012-11-05: 2 via ORAL
  Filled 2012-11-05: qty 2

## 2012-11-05 MED ORDER — PREDNISONE 20 MG PO TABS: ORAL_TABLET | ORAL | Status: DC

## 2012-11-05 MED ORDER — HYDROCODONE-ACETAMINOPHEN 5-325 MG PO TABS: 2.0000 | ORAL_TABLET | ORAL | Status: DC | PRN

## 2012-11-05 NOTE — ED Notes (Signed)
Pt states that she began to rt hip pain on Sunday; pt denies fall or injury to hip; pt states that the pain is off and on, pt reports pain is better in the morning but gets progressively worse after being on her leg all day; pt states that she was diagnosed with bulging disc to her back several years ago and thinks this may have something to with it; pt states that she saw her MD today and was ordered an Xray today but states that her MD won't get the results until Thurs and she cannot tolerate the discomfort until then.

## 2012-11-05 NOTE — ED Notes (Signed)
OK for pt to d/c without pain reassessment.  Pt has taken med before.  Here for pain management and follow up with primary.

## 2012-11-05 NOTE — ED Provider Notes (Signed)
CSN: [id number]     Arrival date & time 11/05/12  1928 History   First MD Initiated Contact with Patient 11/05/12 1953     Chief Complaint  Patient presents with  . Hip Pain   (Consider location/radiation/quality/duration/timing/severity/associated sxs/prior Treatment) HPI Comments: Patient presents to the ER for evaluation of right hip pain. Patient reports that she has been having pain in the right hip were several days, but has had similar pain in the past. Patient was sent for an x-ray of her right hip today. She has not gotten results, pregnancy test with her. She reports that her doctor is out of the office for the next couple of days has been having severe pain. Patient reports a sharp stabbing pain behind the right hip which goes all the way down her leg when she moves. She has not had any weakness but does have some numbness in her toes. She has not had any change in bowel or bladder function. She denies any injury prior to onset of the pain. She has been taking over-the-counter Tylenol as well as Ultram without any improvement.  Patient is a 67 y.o. female presenting with hip pain.  Hip Pain    Past Medical History  Diagnosis Date  . Diabetes mellitus without complication   . Hypertension   . Aortic aneurysm 2011    3.59mm  . Ovarian cyst 06/01/2009    5 cm  . Onycholysis of toenail   . Osteoporosis   . Chronic back pain   . Restless legs   . Back spasm   . Emphysema   . Bulging discs    History reviewed. No pertinent past surgical history. No family history on file. History  Substance Use Topics  . Smoking status: Former Smoker -- 1.00 packs/day for 47 years    Types: Cigarettes    Quit date: 05/30/2012  . Smokeless tobacco: Never Used  . Alcohol Use: No   OB History   Grav Para Term Preterm Abortions TAB SAB Ect Mult Living                 Review of Systems  Musculoskeletal: Positive for back pain.  Neurological: Positive for numbness.    Allergies    Review of patient's allergies indicates no known allergies.  Home Medications   Current Outpatient Rx  Name  Route  Sig  Dispense  Refill  . acetaminophen (TYLENOL) 500 MG tablet   Oral   Take 500 mg by mouth 3 (three) times daily.         Marland Kitchen alendronate (FOSAMAX) 70 MG tablet   Oral   Take 70 mg by mouth every 7 (seven) days. Take with a full glass of water on an empty stomach.         . Ascorbic Acid (VITAMIN C) 1000 MG tablet   Oral   Take 2,000 mg by mouth daily.          Marland Kitchen aspirin 81 MG tablet   Oral   Take 81 mg by mouth at bedtime.          . calcium-vitamin D (OSCAL WITH D) 500-200 MG-UNIT per tablet   Oral   Take 1 tablet by mouth 2 (two) times daily.         . cetirizine (ZYRTEC) 10 MG tablet   Oral   Take 10 mg by mouth at bedtime.         . cyclobenzaprine (FLEXERIL) 10 MG tablet   Oral  Take 10 mg by mouth 3 (three) times daily as needed.         . docusate sodium (COLACE) 250 MG capsule   Oral   Take 500 mg by mouth daily.          . fish oil-omega-3 fatty acids 1000 MG capsule   Oral   Take 1 g by mouth at bedtime.         . hydrochlorothiazide (HYDRODIURIL) 25 MG tablet   Oral   Take 12.5 mg by mouth daily.          Marland Kitchen lisinopril (PRINIVIL,ZESTRIL) 5 MG tablet   Oral   Take 5 mg by mouth at bedtime.          . Multiple Vitamins-Minerals (MULTIVITAMIN WITH MINERALS) tablet   Oral   Take 1 tablet by mouth daily.         . naproxen (NAPROSYN) 500 MG tablet   Oral   Take 500 mg by mouth 2 (two) times daily with a meal.         . pravastatin (PRAVACHOL) 20 MG tablet   Oral   Take 20 mg by mouth at bedtime.          . pyridOXINE (VITAMIN B-6) 100 MG tablet   Oral   Take 100 mg by mouth at bedtime.          . sitaGLIPtin (JANUVIA) 50 MG tablet   Oral   Take 50 mg by mouth daily.         Marland Kitchen terbinafine (LAMISIL) 250 MG tablet   Oral   Take 250 mg by mouth at bedtime.         . traMADol (ULTRAM) 50 MG  tablet   Oral   Take 50 mg by mouth every 8 (eight) hours as needed.          BP 114/67  Pulse 96  Temp(Src) 98.8 F (37.1 C) (Oral)  Resp 20  Ht 4\' 9"  (1.448 m)  Wt 115 lb (52.164 kg)  BMI 24.88 kg/m2  SpO2 96% Physical Exam  Musculoskeletal:       Right hip: She exhibits normal range of motion and no tenderness.       Right knee: She exhibits normal range of motion, no swelling and no effusion. No tenderness found.       Lumbar back: She exhibits no bony tenderness.       Back:  Neurological:  Reflex Scores:      Patellar reflexes are 2+ on the right side. Skin: Skin is warm, dry and intact.    ED Course  Procedures (including critical care time) Labs Review Labs Reviewed - No data to display Imaging Review No results found.  MDM  Diagnosis: Sciatica  Presents to the ER with complaints of right sided hip and lower back pain radiating down the right leg. She does report that she was told she had a bulging disc 7 years ago. Symptoms are consistent with sciatica. She has normal strength, sensation and reflexes in the lower extremity. No evidence of saddle emboli. There is nothing on examination to warrant emergent MRI or neurosurgical intervention. I did look at her plain film x-ray, some mild degenerative changes, no fracture. The patient will be treated empirically to followup with her doctor later in the week when he is back in the office.    Gilda Crease, MD 11/05/12 2022

## 2012-11-20 ENCOUNTER — Encounter: Payer: Self-pay | Admitting: Physical Medicine & Rehabilitation

## 2012-11-28 ENCOUNTER — Encounter
Payer: Medicare Other | Attending: Physical Medicine and Rehabilitation | Admitting: Physical Medicine and Rehabilitation

## 2012-11-28 ENCOUNTER — Encounter: Payer: Self-pay | Admitting: Physical Medicine and Rehabilitation

## 2012-11-28 VITALS — BP 135/81 | HR 79 | Resp 16 | Ht <= 58 in | Wt 113.0 lb

## 2012-11-28 DIAGNOSIS — Z79899 Other long term (current) drug therapy: Secondary | ICD-10-CM

## 2012-11-28 DIAGNOSIS — M25569 Pain in unspecified knee: Secondary | ICD-10-CM

## 2012-11-28 DIAGNOSIS — M4317 Spondylolisthesis, lumbosacral region: Secondary | ICD-10-CM

## 2012-11-28 DIAGNOSIS — M542 Cervicalgia: Secondary | ICD-10-CM

## 2012-11-28 DIAGNOSIS — Q062 Diastematomyelia: Secondary | ICD-10-CM | POA: Insufficient documentation

## 2012-11-28 DIAGNOSIS — Z5181 Encounter for therapeutic drug level monitoring: Secondary | ICD-10-CM

## 2012-11-28 DIAGNOSIS — Q762 Congenital spondylolisthesis: Secondary | ICD-10-CM

## 2012-11-28 DIAGNOSIS — M549 Dorsalgia, unspecified: Secondary | ICD-10-CM

## 2012-11-28 DIAGNOSIS — M47812 Spondylosis without myelopathy or radiculopathy, cervical region: Secondary | ICD-10-CM | POA: Insufficient documentation

## 2012-11-28 DIAGNOSIS — M47817 Spondylosis without myelopathy or radiculopathy, lumbosacral region: Secondary | ICD-10-CM | POA: Insufficient documentation

## 2012-11-28 DIAGNOSIS — M25519 Pain in unspecified shoulder: Secondary | ICD-10-CM

## 2012-11-28 MED ORDER — MELOXICAM 15 MG PO TABS: 15.0000 mg | ORAL_TABLET | Freq: Every day | ORAL | Status: DC

## 2012-11-28 NOTE — Progress Notes (Signed)
Subjective:    Patient ID: Kylie Hurst, female    DOB: November 03, 1945, 67 y.o.   MRN: [id number]  HPI The patient is a 67 year old female, who presents with shoulder pain which radiates into the left UE in a C7 distribution . The symptoms started one month ago. The patient complains about moderate to severe pain . Patient also complains about numbness and tingling in both of her feet .  She describes the pain as constant aching . Taking medications , changing positions alleviate the symptoms. Patient can not really say what  aggrevates the symptoms. The patient grades his pain as a  7/10. She also complains about LBP, mainly after she has been active, she had some radiating Sx into her right hip and LE, these have subsided. She reports that she had MRIs of her L-spine and C-spine done last week, we do not have the results at this point, but I will try to get them. She reports that she was dx with having Diastomatomyelia, in the C- and L-spine, per patient. She has not tried PT , nor any injections Pain Inventory Average Pain 8 Pain Right Now 7 My pain is aching  In the last 24 hours, has pain interfered with the following? General activity 2 Relation with others 1 Enjoyment of life 2 What TIME of day is your pain at its worst? night Sleep (in general) Fair  Pain is worse with: sitting Pain improves with: medication Relief from Meds: 3  Mobility walk without assistance ability to climb steps?  yes do you drive?  yes Do you have any goals in this area?  no  Function retired Do you have any goals in this area?  no  Neuro/Psych bladder control problems weakness numbness spasms  Prior Studies Any changes since last visit?  no  Physicians involved in your care Any changes since last visit?  no   No family history on file. History   Social History  . Marital Status: Legally Separated    Spouse Name: N/A    Number of Children: N/A  . Years of Education: N/A   Social  History Main Topics  . Smoking status: Former Smoker -- 1.00 packs/day for 47 years    Types: Cigarettes    Quit date: 05/30/2012  . Smokeless tobacco: Never Used  . Alcohol Use: No  . Drug Use: No  . Sexual Activity: None   Other Topics Concern  . None   Social History Narrative  . None   No past surgical history on file. Past Medical History  Diagnosis Date  . Diabetes mellitus without complication   . Hypertension   . Aortic aneurysm 2011    3.13mm  . Ovarian cyst 06/01/2009    5 cm  . Onycholysis of toenail   . Osteoporosis   . Chronic back pain   . Restless legs   . Back spasm   . Emphysema   . Bulging discs    BP 135/81  Pulse 79  Resp 16  Ht 4\' 9"  (1.448 m)  Wt 113 lb (51.256 kg)  BMI 24.45 kg/m2  SpO2 98%     Review of Systems  Genitourinary:       Bladder control problems  Neurological: Positive for weakness and numbness.       Spasms       Objective:   Physical Exam  Constitutional: She is oriented to person, place, and time. She appears well-developed and well-nourished.  HENT:  Head:  Normocephalic.  Neck: Neck supple.  Musculoskeletal: She exhibits tenderness.  Neurological: She is alert and oriented to person, place, and time.  Skin: Skin is warm and dry.  Psychiatric: She has a normal mood and affect.    Symmetric normal motor tone is noted throughout. Normal muscle bulk, except atrophy of right quadriceps, right gastrocnemius, and right tibialis, also atrophy of left intrinsic muscles of left hand. Muscle testing reveals 5/5 muscle strength of the upper extremity,except left triceps and left extensors of the 4th and 5th finger, 4- 4/5,  and 5/5 of the lower extremity except right quadriceps 4+/5, right tibialis 3+/5, right gastrocnemius 4/5. Full range of motion in upper and lower extremities. ROM of spine is mildly restricted. Fine motor movements are normal in both hands. Sensory is mildly decreased  and symmetric to light touch, below  her ankles bilateral. DTR in the upper and lower extremity are present and symmetric 2+. No clonus is noted.  Patient arises from chair without difficulty. Narrow based gait with normal arm swing bilateral , able to walk on heels and toes . Tandem walk is possible, slightly unstable. No pronator drift. Rhomberg negative. Spurling positive to the left       Assessment & Plan:  This is a 67 year old female  with 1.Cervical spondylosis, LUE pain and radiating Sx into the LUE in a C7 distribution 2.Lumbar spondylosis, and Spondylolisthesis at L5-S1 3.Diastematomyelia, per patient, after having MRIs of her L- and C-spine, will try to get the MRI reports.Hillary called Dr. Ollen Gross office to get the report.  Plan : Patient has appointment with spine surgeon next Monday, depending on whether she will have surgery done or not, we will consider further treatment. Consider PT with modalities for pain relief. Patient is on Flexeril 10mg  bid, Tramadol 50mg  q 8 hrs, and Hydrocodone 5mg  bid. We did a UDS today, depending on results we will continue with her pain medication. Also prescribed Mobic 15mg  qd Follow up in 2 weeks, for Rx.

## 2012-11-28 NOTE — Patient Instructions (Signed)
Apply heat, take your medication as prescribed, and follow up with your spine surgeon.

## 2012-12-02 DIAGNOSIS — M47812 Spondylosis without myelopathy or radiculopathy, cervical region: Secondary | ICD-10-CM | POA: Insufficient documentation

## 2012-12-02 DIAGNOSIS — M47817 Spondylosis without myelopathy or radiculopathy, lumbosacral region: Secondary | ICD-10-CM | POA: Insufficient documentation

## 2012-12-02 DIAGNOSIS — M4317 Spondylolisthesis, lumbosacral region: Secondary | ICD-10-CM | POA: Insufficient documentation

## 2012-12-02 HISTORY — DX: Spondylosis without myelopathy or radiculopathy, cervical region: M47.812

## 2012-12-02 HISTORY — DX: Spondylolisthesis, lumbosacral region: M43.17

## 2012-12-02 HISTORY — DX: Spondylosis without myelopathy or radiculopathy, lumbosacral region: M47.817

## 2012-12-02 NOTE — Progress Notes (Signed)
Has this pt been seen by myself or Riley Kill.  Did Clydie Braun do new eval?

## 2012-12-03 ENCOUNTER — Other Ambulatory Visit: Payer: Self-pay | Admitting: Orthopedic Surgery

## 2012-12-04 ENCOUNTER — Telehealth: Payer: Self-pay

## 2012-12-04 NOTE — Telephone Encounter (Signed)
Kylie Hurst with [facility] called to find out if we want them to prescribe for patient and for how long or do we want to manage her medication.  Patient has an appointment for spine surgery with Dr Yevette Edwards and they would like to know how to proceed.

## 2012-12-04 NOTE — Telephone Encounter (Signed)
The patient is scheduled for surgery in 2 weeks, so during this time, 1-2 month, it would be ok if they prescribe, when she has recovered for one to 2 month from the surgery, we can take over

## 2012-12-05 NOTE — Telephone Encounter (Signed)
Left message informing Kylie Hurst that Dr Yevette Edwards could prescribe medication for patient for 1-2 months or until patient is cleared from surgery.

## 2012-12-09 ENCOUNTER — Telehealth: Payer: Self-pay

## 2012-12-09 NOTE — Telephone Encounter (Signed)
Patient called to let us know that Dr Yevette Edwards did give her hydrocodone.

## 2012-12-11 ENCOUNTER — Encounter (HOSPITAL_COMMUNITY): Payer: Self-pay | Admitting: Pharmacy Technician

## 2012-12-13 ENCOUNTER — Encounter: Payer: Self-pay | Admitting: Physical Medicine and Rehabilitation

## 2012-12-13 ENCOUNTER — Encounter (HOSPITAL_COMMUNITY): Payer: Self-pay

## 2012-12-13 ENCOUNTER — Encounter
Payer: Medicare Other | Attending: Physical Medicine and Rehabilitation | Admitting: Physical Medicine and Rehabilitation

## 2012-12-13 ENCOUNTER — Encounter (HOSPITAL_COMMUNITY)
Admission: RE | Admit: 2012-12-13 | Discharge: 2012-12-13 | Disposition: A | Discharge status: Home / Self Care | Payer: 59 | Source: Ambulatory Visit | Attending: Orthopedic Surgery | Admitting: Orthopedic Surgery

## 2012-12-13 VITALS — BP 133/82 | HR 91 | Resp 14 | Ht <= 58 in | Wt 115.8 lb

## 2012-12-13 DIAGNOSIS — Z79899 Other long term (current) drug therapy: Secondary | ICD-10-CM | POA: Insufficient documentation

## 2012-12-13 DIAGNOSIS — M4317 Spondylolisthesis, lumbosacral region: Secondary | ICD-10-CM

## 2012-12-13 DIAGNOSIS — Q762 Congenital spondylolisthesis: Secondary | ICD-10-CM

## 2012-12-13 DIAGNOSIS — M4310 Spondylolisthesis, site unspecified: Secondary | ICD-10-CM | POA: Insufficient documentation

## 2012-12-13 DIAGNOSIS — Z01818 Encounter for other preprocedural examination: Secondary | ICD-10-CM | POA: Insufficient documentation

## 2012-12-13 DIAGNOSIS — M25519 Pain in unspecified shoulder: Secondary | ICD-10-CM | POA: Insufficient documentation

## 2012-12-13 DIAGNOSIS — M47817 Spondylosis without myelopathy or radiculopathy, lumbosacral region: Secondary | ICD-10-CM

## 2012-12-13 DIAGNOSIS — M47812 Spondylosis without myelopathy or radiculopathy, cervical region: Secondary | ICD-10-CM | POA: Insufficient documentation

## 2012-12-13 DIAGNOSIS — Z01810 Encounter for preprocedural cardiovascular examination: Secondary | ICD-10-CM | POA: Insufficient documentation

## 2012-12-13 DIAGNOSIS — Z01812 Encounter for preprocedural laboratory examination: Secondary | ICD-10-CM | POA: Insufficient documentation

## 2012-12-13 HISTORY — DX: Depression, unspecified: F32.A

## 2012-12-13 HISTORY — DX: Major depressive disorder, single episode, unspecified: F32.9

## 2012-12-13 HISTORY — DX: Unspecified osteoarthritis, unspecified site: M19.90

## 2012-12-13 LAB — COMPREHENSIVE METABOLIC PANEL
ALT: 17 U/L (ref 0–35)
AST: 19 U/L (ref 0–37)
Albumin: 3.9 g/dL (ref 3.5–5.2)
Calcium: 10 mg/dL (ref 8.4–10.5)
GFR calc Af Amer: >90 mL/min (ref >90)
Glucose, Bld: 97 mg/dL (ref 70–99)
Potassium: 4 mEq/L (ref 3.5–5.1)
Sodium: 138 mEq/L (ref 135–145)
Total Protein: 7.4 g/dL (ref 6.0–8.3)

## 2012-12-13 LAB — CBC WITH DIFFERENTIAL/PLATELET
Basophils Absolute: 0 10*3/uL (ref 0.0–0.1)
Basophils Relative: 0 % (ref 0–1)
Eosinophils Absolute: 0.1 10*3/uL (ref 0.0–0.7)
Eosinophils Relative: 1 % (ref 0–5)
Hemoglobin: 15.1 g/dL — ABNORMAL HIGH (ref 12.0–15.0)
MCH: 33.3 pg (ref 26.0–34.0)
MCV: 95.4 fL (ref 78.0–100.0)
Monocytes Relative: 9 % (ref 3–12)
Neutro Abs: 4.7 10*3/uL (ref 1.7–7.7)
Neutrophils Relative %: 63 % (ref 43–77)
Platelets: 230 10*3/uL (ref 150–400)
RBC: 4.53 MIL/uL (ref 3.87–5.11)
RDW: 13.3 % (ref 11.5–15.5)

## 2012-12-13 LAB — APTT: aPTT: 29 seconds (ref 24–37)

## 2012-12-13 LAB — URINALYSIS, ROUTINE W REFLEX MICROSCOPIC
Glucose, UA: NEGATIVE mg/dL
Leukocytes, UA: NEGATIVE
Protein, ur: NEGATIVE mg/dL
Specific Gravity, Urine: 1.013 (ref 1.005–1.030)
Urobilinogen, UA: 0.2 mg/dL (ref 0.0–1.0)
pH: 6 (ref 5.0–8.0)

## 2012-12-13 LAB — SURGICAL PCR SCREEN
MRSA, PCR: NEGATIVE
Staphylococcus aureus: NEGATIVE

## 2012-12-13 NOTE — Progress Notes (Signed)
Primary =- dr. Karren Burly williams Does not have a cardiologist Had ekg maybe 3 years ago at [facility] no other cardiac testing

## 2012-12-13 NOTE — Patient Instructions (Signed)
Ask your spine surgeon whether he will prescribe pain medication to you, for the first 1-2 month after your surgery, if not we will give you a prescription

## 2012-12-13 NOTE — Progress Notes (Signed)
Subjective:    Patient ID: Kylie Hurst, female    DOB: [birth year], 67 y.o.   MRN: [id number]  HPI The patient is a 67 year old female, who presents with shoulder pain which radiates into the left UE in a C7 distribution . The symptoms started one month ago. The patient complains about moderate to severe pain . Patient also complains about numbness and tingling in both of her feet . She describes the pain as constant aching . Taking medications , changing positions alleviate the symptoms. Patient can not really say what aggrevates the symptoms. The patient grades his pain as a 7/10.  She also complains about LBP, mainly after she has been active, she had some radiating Sx into her right hip and LE, these have subsided.  She reports that she had MRIs of her L-spine and C-spine done last week, we do not have the results at this point, but I will try to get them. She reports that she was dx with having Diastomatomyelia, in the C- and L-spine, per patient.  She has not tried PT , nor any injections   Pain Inventory Average Pain 8 Pain Right Now 10 My pain is constant, dull and aching  In the last 24 hours, has pain interfered with the following? General activity 4 Relation with others 7 Enjoyment of life 2 What TIME of day is your pain at its worst? night Sleep (in general) Fair  Pain is worse with: sitting Pain improves with: rest and medication Relief from Meds: 4  Mobility walk without assistance ability to climb steps?  yes do you drive?  yes  Function retired  Neuro/Psych bladder control problems bowel control problems weakness numbness spasms loss of taste or smell  Prior Studies Any changes since last visit?  no  Physicians involved in your care Any changes since last visit?  no   History reviewed. No pertinent family history. History   Social History  . Marital Status: Legally Separated    Spouse Name: N/A    Number of Children: N/A  . Years of  Education: N/A   Social History Main Topics  . Smoking status: Current Every Day Smoker -- 0.50 packs/day for 47 years    Types: Cigarettes  . Smokeless tobacco: Never Used  . Alcohol Use: No  . Drug Use: No  . Sexual Activity: None   Other Topics Concern  . None   Social History Narrative  . None   Past Surgical History  Procedure Laterality Date  . Colonoscopy     Past Medical History  Diagnosis Date  . Hypertension   . Aortic aneurysm 2011    3.20mm  . Ovarian cyst 06/01/2009    5 cm  . Onycholysis of toenail   . Osteoporosis   . Chronic back pain   . Restless legs   . Back spasm   . Emphysema   . Bulging discs   . Depression   . Diabetes mellitus without complication     fasting 90-110  . Arthritis    BP 133/82  Pulse 91  Resp 14  Ht 4\' 9"  (1.448 m)  Wt 115 lb 12.8 oz (52.527 kg)  BMI 25.05 kg/m2  SpO2 96%   Review of Systems  Respiratory: Positive for wheezing.   Gastrointestinal: Positive for constipation.       Bowel Control Problems  Genitourinary:       Bladder control problems  Musculoskeletal:       Spasms  Neurological:  Positive for weakness and numbness.  All other systems reviewed and are negative.       Objective:   Physical Exam Constitutional: She is oriented to person, place, and time. She appears well-developed and well-nourished.  HENT:  Head: Normocephalic.  Neck: Neck supple.  Musculoskeletal: She exhibits tenderness.  Neurological: She is alert and oriented to person, place, and time.  Skin: Skin is warm and dry.  Psychiatric: She has a normal mood and affect.  Symmetric normal motor tone is noted throughout. Normal muscle bulk, except atrophy of right quadriceps, right gastrocnemius, and right tibialis, also atrophy of left intrinsic muscles of left hand. Muscle testing reveals 5/5 muscle strength of the upper extremity,except left triceps and left extensors of the 4th and 5th finger, 4- 4/5, and 5/5 of the lower extremity  except right quadriceps 4+/5, right tibialis 3+/5, right gastrocnemius 4/5. Full range of motion in upper and lower extremities. ROM of spine is mildly restricted. Fine motor movements are normal in both hands.  Sensory is mildly decreased and symmetric to light touch, below her ankles bilateral.  DTR in the upper and lower extremity are present and symmetric 2+. No clonus is noted.  Patient arises from chair without difficulty. Narrow based gait with normal arm swing bilateral , able to walk on heels and toes . Tandem walk is possible, slightly unstable. No pronator drift. Rhomberg negative.  Spurling positive to the left        Assessment & Plan:  This is a 67 year old female with  1.Cervical spondylosis, LUE pain and radiating Sx into the LUE in a C7 distribution  2.Lumbar spondylosis, and Spondylolisthesis at L5-S1  3.Diastematomyelia, per patient, after having MRIs of her L- and C-spine, will try to get the MRI reports.Hillary called Dr. Ollen Gross office to get the report.  Plan :  Patient had an appointment with Dr. Marthenia Rolling, spine surgeon, last Monday,  she will have surgery on 12/18/12, ACDF C7-T1. I asked her to find out, whether the surgeon is prescribing her pain medication  post op, she forgot what they told her, she will call on Monday, and let us know. I am assuming he will, but if not we will give her a Rx for her Hydrocodone, she will just come to our office to pick it up, if necessary. done or not, we will consider further treatment. Consider PT with modalities for pain relief.  Patient is on Flexeril 10mg  bid, Tramadol 50mg  q 8 hrs, and Hydrocodone 5mg  bid. We did a UDS at the last visit, which was consistent. today,  Patient has d/c the  Mobic 15mg  qd, because of the pending surgery  Follow up in 1 month

## 2012-12-13 NOTE — Pre-Procedure Instructions (Signed)
Kylie Hurst  12/13/2012   Your procedure is scheduled on:  Wednesday, November 19th  Report to Main Entrance "A" and check in with admitting at 1000 AM.  Call this number if you have problems the morning of surgery: [phone]   Remember:   Do not eat food or drink liquids after midnight.   Take these medicines the morning of surgery with A SIP OF WATER: hydrocodone or tylenol if needed  Stop taking over the counter vitamins/herbal medications, aspirin, NSAIDS (mobic, ibuprofen) as of today.   Do not wear jewelry, make-up or nail polish.  Do not wear lotions, powders, or perfumes. You may wear deodorant.  Do not shave 48 hours prior to surgery. Men may shave face and neck.  Do not bring valuables to the hospital.  [facility] is not responsible   for any belongings or valuables.               Contacts, dentures or bridgework may not be worn into surgery.  Leave suitcase in the car. After surgery it may be brought to your room.  For patients admitted to the hospital, discharge time is determined by your   treatment team.               Patients discharged the day of surgery will not be allowed to drive home.   Special Instructions: Shower using CHG 2 nights before surgery and the night before surgery.  If you shower the day of surgery use CHG.  Use special wash - you have one bottle of CHG for all showers.  You should use approximately 1/3 of the bottle for each shower.   Please read over the following fact sheets that you were given: Pain Booklet, Coughing and Deep Breathing, Blood Transfusion Information, MRSA Information and Surgical Site Infection Prevention

## 2012-12-16 NOTE — Progress Notes (Signed)
Anesthesia Chart Review:  Patient is a 67 year old female scheduled for C7-T1 ACDF on 12/18/12 by Dr. Yevette Edwards.  History includes smoking, HTN, AAA (3.7 cm by 06/14/12 duplex; 2 year follow-up recommended by Dr. Hart Rochester at [facility]), emphysema, DM2 (controlled), depression, RLS, osteoporosis, ovarian cysts, arthritis, chronic back pain.  PCP is Dr. Lerry Liner.  EKG on 12/13/12 showed NSR, possible old anteroseptal infarct (age undetermined).  Currently there are no comparison EKGs available.  (She reported a prior EKG ~ 3 years ago at [facility] which has since closed.)  CXR on 12/24/11 showed emphysema without acute disease.  Preoperative labs noted.  PAT RN notes indicate that she will need a new T&S done on the day of surgery.  No CV symptoms were documented at her PAT visit.  She will be evaluated by her assigned anesthesiologist on the day of surgery.  If she remains asymptomatic from a CV standpoint then I would anticipate that she could proceed as planned.  Velna Ochs [facility] Short Stay Center/Anesthesiology Phone [phone] 12/16/2012 9:54 AM

## 2012-12-17 ENCOUNTER — Telehealth: Payer: Self-pay

## 2012-12-17 MED ORDER — CEFAZOLIN SODIUM-DEXTROSE 2-3 GM-% IV SOLR
2.0000 g | INTRAVENOUS | Status: AC
  Administered 2012-12-18: 2 g via INTRAVENOUS

## 2012-12-17 NOTE — Telephone Encounter (Signed)
Patient called to let us know that Dr Yevette Edwards would prescribe her medications until she is released from his care.

## 2012-12-17 NOTE — Progress Notes (Signed)
I spoke with patient and informed her of arrival time of 9:30 and surgery now scheduled for 12:30

## 2012-12-18 ENCOUNTER — Encounter (HOSPITAL_COMMUNITY): Payer: Self-pay | Admitting: Certified Registered Nurse Anesthetist

## 2012-12-18 ENCOUNTER — Ambulatory Visit (HOSPITAL_COMMUNITY): Payer: Medicare Other

## 2012-12-18 ENCOUNTER — Encounter (HOSPITAL_COMMUNITY)
Admission: RE | Disposition: A | Discharge status: Home / Self Care | Payer: Self-pay | Source: Ambulatory Visit | Attending: Orthopedic Surgery

## 2012-12-18 ENCOUNTER — Ambulatory Visit (HOSPITAL_COMMUNITY): Payer: Medicare Other | Admitting: Certified Registered"

## 2012-12-18 ENCOUNTER — Encounter (HOSPITAL_COMMUNITY): Payer: Medicare Other | Admitting: Vascular Surgery

## 2012-12-18 ENCOUNTER — Inpatient Hospital Stay (HOSPITAL_COMMUNITY)
Admission: RE | Admit: 2012-12-18 | Discharge: 2012-12-19 | DRG: 473 | Disposition: A | Discharge status: Home / Self Care | Payer: Medicare Other | Source: Ambulatory Visit | Attending: Orthopedic Surgery | Admitting: Orthopedic Surgery

## 2012-12-18 DIAGNOSIS — Z7982 Long term (current) use of aspirin: Secondary | ICD-10-CM

## 2012-12-18 DIAGNOSIS — M5010 Cervical disc disorder with radiculopathy, unspecified cervical region: Secondary | ICD-10-CM

## 2012-12-18 DIAGNOSIS — M47812 Spondylosis without myelopathy or radiculopathy, cervical region: Secondary | ICD-10-CM

## 2012-12-18 DIAGNOSIS — M129 Arthropathy, unspecified: Secondary | ICD-10-CM | POA: Diagnosis present

## 2012-12-18 DIAGNOSIS — E119 Type 2 diabetes mellitus without complications: Secondary | ICD-10-CM | POA: Diagnosis present

## 2012-12-18 DIAGNOSIS — M549 Dorsalgia, unspecified: Secondary | ICD-10-CM | POA: Diagnosis present

## 2012-12-18 DIAGNOSIS — G8929 Other chronic pain: Secondary | ICD-10-CM | POA: Diagnosis present

## 2012-12-18 DIAGNOSIS — Z79899 Other long term (current) drug therapy: Secondary | ICD-10-CM

## 2012-12-18 DIAGNOSIS — M4802 Spinal stenosis, cervical region: Principal | ICD-10-CM | POA: Diagnosis present

## 2012-12-18 DIAGNOSIS — F17200 Nicotine dependence, unspecified, uncomplicated: Secondary | ICD-10-CM | POA: Diagnosis present

## 2012-12-18 DIAGNOSIS — I10 Essential (primary) hypertension: Secondary | ICD-10-CM | POA: Diagnosis present

## 2012-12-18 DIAGNOSIS — M810 Age-related osteoporosis without current pathological fracture: Secondary | ICD-10-CM | POA: Diagnosis present

## 2012-12-18 HISTORY — PX: ANTERIOR CERVICAL DECOMP/DISCECTOMY FUSION: SHX1161

## 2012-12-18 LAB — ABO/RH: ABO/RH(D): A POS

## 2012-12-18 LAB — GLUCOSE, CAPILLARY: Glucose-Capillary: 117 mg/dL — ABNORMAL HIGH (ref 70–99)

## 2012-12-18 LAB — TYPE AND SCREEN
ABO/RH(D): A POS
Antibody Screen: NEGATIVE

## 2012-12-18 SURGERY — ANTERIOR CERVICAL DECOMPRESSION/DISCECTOMY FUSION 1 LEVEL
Anesthesia: General | Site: Spine Cervical | Wound class: Clean

## 2012-12-18 MED ORDER — POVIDONE-IODINE 7.5 % EX SOLN: Freq: Once | CUTANEOUS | Status: DC

## 2012-12-18 MED ORDER — BISACODYL 5 MG PO TBEC: 5.0000 mg | DELAYED_RELEASE_TABLET | Freq: Every day | ORAL | Status: DC | PRN

## 2012-12-18 MED ORDER — ONDANSETRON HCL 4 MG/2ML IJ SOLN
INTRAMUSCULAR | Status: DC | PRN
  Administered 2012-12-18: 3 mg via INTRAVENOUS

## 2012-12-18 MED ORDER — MIDAZOLAM HCL 5 MG/5ML IJ SOLN
INTRAMUSCULAR | Status: DC | PRN
  Administered 2012-12-18: 2 mg via INTRAVENOUS

## 2012-12-18 MED ORDER — 0.9 % SODIUM CHLORIDE (POUR BTL) OPTIME
TOPICAL | Status: DC | PRN
  Administered 2012-12-18: 1000 mL

## 2012-12-18 MED ORDER — BUPIVACAINE-EPINEPHRINE 0.25% -1:200000 IJ SOLN
INTRAMUSCULAR | Status: DC | PRN
  Administered 2012-12-18: 6 mL

## 2012-12-18 MED ORDER — LACTATED RINGERS IV SOLN
INTRAVENOUS | Status: DC | PRN
  Administered 2012-12-18 (×2): via INTRAVENOUS

## 2012-12-18 MED ORDER — MORPHINE SULFATE 2 MG/ML IJ SOLN: 1.0000 mg | INTRAMUSCULAR | Status: DC | PRN

## 2012-12-18 MED ORDER — LISINOPRIL 5 MG PO TABS
5.0000 mg | ORAL_TABLET | Freq: Every day | ORAL | Status: DC
  Administered 2012-12-18: 5 mg via ORAL
  Filled 2012-12-18 (×2): qty 1

## 2012-12-18 MED ORDER — PHENYLEPHRINE HCL 10 MG/ML IJ SOLN
INTRAMUSCULAR | Status: DC | PRN
  Administered 2012-12-18 (×3): 40 ug via INTRAVENOUS
  Administered 2012-12-18: 80 ug via INTRAVENOUS

## 2012-12-18 MED ORDER — TERBINAFINE HCL 250 MG PO TABS
250.0000 mg | ORAL_TABLET | Freq: Every day | ORAL | Status: DC
  Administered 2012-12-18: 250 mg via ORAL
  Filled 2012-12-18 (×2): qty 1

## 2012-12-18 MED ORDER — SODIUM CHLORIDE 0.9 % IJ SOLN: 3.0000 mL | Freq: Two times a day (BID) | INTRAMUSCULAR | Status: DC

## 2012-12-18 MED ORDER — OXYCODONE-ACETAMINOPHEN 5-325 MG PO TABS
1.0000 | ORAL_TABLET | ORAL | Status: DC | PRN
  Administered 2012-12-18 – 2012-12-19 (×4): 2 via ORAL
  Filled 2012-12-18 (×4): qty 2

## 2012-12-18 MED ORDER — PHENYLEPHRINE HCL 10 MG/ML IJ SOLN
10.0000 mg | INTRAVENOUS | Status: DC | PRN
  Administered 2012-12-18: 15 ug/min via INTRAVENOUS

## 2012-12-18 MED ORDER — HYDROCHLOROTHIAZIDE 25 MG PO TABS
12.5000 mg | ORAL_TABLET | Freq: Every day | ORAL | Status: DC
  Filled 2012-12-18: qty 0.5

## 2012-12-18 MED ORDER — LORATADINE 10 MG PO TABS
10.0000 mg | ORAL_TABLET | Freq: Every day | ORAL | Status: DC
  Administered 2012-12-19: 10 mg via ORAL
  Filled 2012-12-18: qty 1

## 2012-12-18 MED ORDER — CEFAZOLIN SODIUM-DEXTROSE 2-3 GM-% IV SOLR
INTRAVENOUS | Status: AC
  Filled 2012-12-18: qty 50

## 2012-12-18 MED ORDER — MINERAL OIL LIGHT 100 % EX OIL
TOPICAL_OIL | CUTANEOUS | Status: AC
  Filled 2012-12-18: qty 25

## 2012-12-18 MED ORDER — FLEET ENEMA 7-19 GM/118ML RE ENEM: 1.0000 | ENEMA | Freq: Once | RECTAL | Status: AC | PRN

## 2012-12-18 MED ORDER — VARENICLINE TARTRATE 1 MG PO TABS
1.0000 mg | ORAL_TABLET | Freq: Two times a day (BID) | ORAL | Status: DC
  Administered 2012-12-18 – 2012-12-19 (×2): 1 mg via ORAL
  Filled 2012-12-18 (×3): qty 1

## 2012-12-18 MED ORDER — LACTATED RINGERS IV SOLN
INTRAVENOUS | Status: DC
  Administered 2012-12-18: 11:00:00 via INTRAVENOUS

## 2012-12-18 MED ORDER — FENTANYL CITRATE 0.05 MG/ML IJ SOLN
INTRAMUSCULAR | Status: DC | PRN
  Administered 2012-12-18 (×2): 50 ug via INTRAVENOUS
  Administered 2012-12-18: 100 ug via INTRAVENOUS

## 2012-12-18 MED ORDER — MENTHOL 3 MG MT LOZG: 1.0000 | LOZENGE | OROMUCOSAL | Status: DC | PRN

## 2012-12-18 MED ORDER — SODIUM CHLORIDE 0.9 % IJ SOLN: 3.0000 mL | INTRAMUSCULAR | Status: DC | PRN

## 2012-12-18 MED ORDER — LIDOCAINE HCL (CARDIAC) 20 MG/ML IV SOLN
INTRAVENOUS | Status: DC | PRN
  Administered 2012-12-18: 100 mg via INTRAVENOUS

## 2012-12-18 MED ORDER — THROMBIN 20000 UNITS EX SOLR
CUTANEOUS | Status: AC
  Filled 2012-12-18: qty 20000

## 2012-12-18 MED ORDER — SODIUM CHLORIDE 0.9 % IV SOLN
250.0000 mL | INTRAVENOUS | Status: DC
  Administered 2012-12-18: 250 mL via INTRAVENOUS

## 2012-12-18 MED ORDER — CALCIUM CARBONATE-VITAMIN D 500-200 MG-UNIT PO TABS
1.0000 | ORAL_TABLET | Freq: Two times a day (BID) | ORAL | Status: DC
  Administered 2012-12-18 – 2012-12-19 (×2): 1 via ORAL
  Filled 2012-12-18 (×3): qty 1

## 2012-12-18 MED ORDER — DOCUSATE SODIUM 100 MG PO CAPS
100.0000 mg | ORAL_CAPSULE | Freq: Two times a day (BID) | ORAL | Status: DC
  Administered 2012-12-18 – 2012-12-19 (×2): 100 mg via ORAL
  Filled 2012-12-18 (×2): qty 1

## 2012-12-18 MED ORDER — DIAZEPAM 5 MG PO TABS: 5.0000 mg | ORAL_TABLET | Freq: Four times a day (QID) | ORAL | Status: DC | PRN

## 2012-12-18 MED ORDER — CEFAZOLIN SODIUM 1-5 GM-% IV SOLN
1.0000 g | Freq: Three times a day (TID) | INTRAVENOUS | Status: AC
  Administered 2012-12-18 – 2012-12-19 (×2): 1 g via INTRAVENOUS
  Filled 2012-12-18 (×2): qty 50

## 2012-12-18 MED ORDER — THROMBIN 20000 UNITS EX SOLR
CUTANEOUS | Status: DC | PRN
  Administered 2012-12-18: 14:00:00

## 2012-12-18 MED ORDER — PHENOL 1.4 % MT LIQD
1.0000 | OROMUCOSAL | Status: DC | PRN
  Administered 2012-12-18: 1 via OROMUCOSAL
  Filled 2012-12-18: qty 177

## 2012-12-18 MED ORDER — VITAMIN C 500 MG PO TABS
1000.0000 mg | ORAL_TABLET | Freq: Two times a day (BID) | ORAL | Status: DC
  Administered 2012-12-18 – 2012-12-19 (×2): 1000 mg via ORAL
  Filled 2012-12-18 (×3): qty 2

## 2012-12-18 MED ORDER — ACETAMINOPHEN 650 MG RE SUPP: 650.0000 mg | RECTAL | Status: DC | PRN

## 2012-12-18 MED ORDER — HEMOSTATIC AGENTS (NO CHARGE) OPTIME
TOPICAL | Status: DC | PRN
  Administered 2012-12-18: 1

## 2012-12-18 MED ORDER — ONDANSETRON HCL 4 MG/2ML IJ SOLN: 4.0000 mg | INTRAMUSCULAR | Status: DC | PRN

## 2012-12-18 MED ORDER — ACETAMINOPHEN 325 MG PO TABS: 650.0000 mg | ORAL_TABLET | ORAL | Status: DC | PRN

## 2012-12-18 MED ORDER — SIMVASTATIN 10 MG PO TABS
10.0000 mg | ORAL_TABLET | Freq: Every day | ORAL | Status: DC
  Administered 2012-12-18: 10 mg via ORAL
  Filled 2012-12-18 (×2): qty 1

## 2012-12-18 MED ORDER — VITAMIN B-6 100 MG PO TABS
100.0000 mg | ORAL_TABLET | Freq: Every day | ORAL | Status: DC
  Administered 2012-12-18: 100 mg via ORAL
  Filled 2012-12-18 (×2): qty 1

## 2012-12-18 MED ORDER — ALENDRONATE SODIUM 70 MG PO TABS: 70.0000 mg | ORAL_TABLET | ORAL | Status: DC

## 2012-12-18 MED ORDER — SENNOSIDES-DOCUSATE SODIUM 8.6-50 MG PO TABS: 1.0000 | ORAL_TABLET | Freq: Every evening | ORAL | Status: DC | PRN

## 2012-12-18 MED ORDER — ZOLPIDEM TARTRATE 5 MG PO TABS: 5.0000 mg | ORAL_TABLET | Freq: Every evening | ORAL | Status: DC | PRN

## 2012-12-18 MED ORDER — ROCURONIUM BROMIDE 100 MG/10ML IV SOLN
INTRAVENOUS | Status: DC | PRN
  Administered 2012-12-18: 40 mg via INTRAVENOUS

## 2012-12-18 MED ORDER — GLYCOPYRROLATE 0.2 MG/ML IJ SOLN
INTRAMUSCULAR | Status: DC | PRN
  Administered 2012-12-18: 0.4 mg via INTRAVENOUS

## 2012-12-18 MED ORDER — ONDANSETRON HCL 4 MG/2ML IJ SOLN: 4.0000 mg | Freq: Once | INTRAMUSCULAR | Status: DC | PRN

## 2012-12-18 MED ORDER — PROPOFOL 10 MG/ML IV BOLUS
INTRAVENOUS | Status: DC | PRN
  Administered 2012-12-18: 80 mg via INTRAVENOUS

## 2012-12-18 MED ORDER — HYDROMORPHONE HCL PF 1 MG/ML IJ SOLN: 0.2500 mg | INTRAMUSCULAR | Status: DC | PRN

## 2012-12-18 MED ORDER — LINAGLIPTIN 5 MG PO TABS
5.0000 mg | ORAL_TABLET | Freq: Every day | ORAL | Status: DC
  Administered 2012-12-19: 5 mg via ORAL
  Filled 2012-12-18: qty 1

## 2012-12-18 SURGICAL SUPPLY — 72 items
BENZOIN TINCTURE PRP APPL 2/3 (GAUZE/BANDAGES/DRESSINGS) ×2 IMPLANT
BIT DRILL NEURO 2X3.1 SFT TUCH (MISCELLANEOUS) ×1 IMPLANT
BIT DRILL SKYLINE 14 (BIT) ×1
BIT DRILL SKYLINE 14MM (BIT) ×1 IMPLANT
BLADE SURG 15 STRL LF DISP TIS (BLADE) ×1 IMPLANT
BLADE SURG 15 STRL SS (BLADE) ×1
BLADE SURG ROTATE 9660 (MISCELLANEOUS) ×2 IMPLANT
BUR MATCHSTICK NEURO 3.0 LAGG (BURR) IMPLANT
CARTRIDGE OIL MAESTRO DRILL (MISCELLANEOUS) ×1 IMPLANT
CLOTH BEACON ORANGE TIMEOUT ST (SAFETY) ×2 IMPLANT
CLSR STERI-STRIP ANTIMIC 1/2X4 (GAUZE/BANDAGES/DRESSINGS) ×2 IMPLANT
CORDS BIPOLAR (ELECTRODE) ×2 IMPLANT
COVER SURGICAL LIGHT HANDLE (MISCELLANEOUS) ×2 IMPLANT
CRADLE DONUT ADULT HEAD (MISCELLANEOUS) ×2 IMPLANT
DIFFUSER DRILL AIR PNEUMATIC (MISCELLANEOUS) ×2 IMPLANT
DRAIN JACKSON RD 7FR 3/32 (WOUND CARE) IMPLANT
DRAPE C-ARM 42X72 X-RAY (DRAPES) ×2 IMPLANT
DRAPE POUCH INSTRU U-SHP 10X18 (DRAPES) ×2 IMPLANT
DRAPE SURG 17X23 STRL (DRAPES) ×6 IMPLANT
DRILL BIT SKYLINE 14MM (BIT) ×1
DRILL NEURO 2X3.1 SOFT TOUCH (MISCELLANEOUS) ×2
DURAPREP 26ML APPLICATOR (WOUND CARE) ×2 IMPLANT
ELECT COATED BLADE 2.86 ST (ELECTRODE) ×2 IMPLANT
ELECT REM PT RETURN 9FT ADLT (ELECTROSURGICAL) ×2
ELECTRODE REM PT RTRN 9FT ADLT (ELECTROSURGICAL) ×1 IMPLANT
EVACUATOR SILICONE 100CC (DRAIN) IMPLANT
GAUZE SPONGE 4X4 16PLY XRAY LF (GAUZE/BANDAGES/DRESSINGS) ×2 IMPLANT
GLOVE BIO SURGEON STRL SZ7 (GLOVE) ×2 IMPLANT
GLOVE BIO SURGEON STRL SZ8 (GLOVE) ×2 IMPLANT
GLOVE BIOGEL PI IND STRL 7.0 (GLOVE) ×2 IMPLANT
GLOVE BIOGEL PI IND STRL 8 (GLOVE) ×1 IMPLANT
GLOVE BIOGEL PI INDICATOR 7.0 (GLOVE) ×2
GLOVE BIOGEL PI INDICATOR 8 (GLOVE) ×1
GOWN SRG XL XLNG 56XLVL 4 (GOWN DISPOSABLE) ×1 IMPLANT
GOWN STRL NON-REIN LRG LVL3 (GOWN DISPOSABLE) ×2 IMPLANT
GOWN STRL NON-REIN XL XLG LVL4 (GOWN DISPOSABLE) ×1
INTERLOCK LRDTC CRVCL VBR 6MM (Bone Implant) ×1 IMPLANT
IV CATH 14GX2 1/4 (CATHETERS) ×2 IMPLANT
KIT BASIN OR (CUSTOM PROCEDURE TRAY) ×2 IMPLANT
KIT ROOM TURNOVER OR (KITS) ×2 IMPLANT
LORDOTIC CERVICAL VBR 6MM SM (Bone Implant) ×2 IMPLANT
MANIFOLD NEPTUNE II (INSTRUMENTS) ×2 IMPLANT
NEEDLE 27GAX1X1/2 (NEEDLE) ×2 IMPLANT
NEEDLE SPNL 20GX3.5 QUINCKE YW (NEEDLE) ×2 IMPLANT
NS IRRIG 1000ML POUR BTL (IV SOLUTION) ×2 IMPLANT
OIL CARTRIDGE MAESTRO DRILL (MISCELLANEOUS) ×2
PACK ORTHO CERVICAL (CUSTOM PROCEDURE TRAY) ×2 IMPLANT
PAD ARMBOARD 7.5X6 YLW CONV (MISCELLANEOUS) ×4 IMPLANT
PATTIES SURGICAL .5 X.5 (GAUZE/BANDAGES/DRESSINGS) IMPLANT
PATTIES SURGICAL .5 X1 (DISPOSABLE) ×2 IMPLANT
PIN DISTRACTION 14 (PIN) ×2 IMPLANT
PLATE ONE LEVEL SKYLINE 14MM (Plate) ×2 IMPLANT
PUTTY BONE DBX 2.5 MIS (Bone Implant) ×2 IMPLANT
SCREW SKYLINE VAR OS 14MM (Screw) ×8 IMPLANT
SPONGE GAUZE 4X4 12PLY (GAUZE/BANDAGES/DRESSINGS) ×2 IMPLANT
SPONGE INTESTINAL PEANUT (DISPOSABLE) ×2 IMPLANT
SPONGE SURGIFOAM ABS GEL 100 (HEMOSTASIS) ×2 IMPLANT
STRIP CLOSURE SKIN 1/2X4 (GAUZE/BANDAGES/DRESSINGS) ×2 IMPLANT
SURGIFLO TRUKIT (HEMOSTASIS) IMPLANT
SUT MNCRL AB 4-0 PS2 18 (SUTURE) ×2 IMPLANT
SUT SILK 4 0 (SUTURE) ×1
SUT SILK 4-0 18XBRD TIE 12 (SUTURE) ×1 IMPLANT
SUT VIC AB 2-0 CT2 18 VCP726D (SUTURE) ×2 IMPLANT
SYR BULB IRRIGATION 50ML (SYRINGE) ×2 IMPLANT
SYR CONTROL 10ML LL (SYRINGE) ×4 IMPLANT
TAPE CLOTH 4X10 WHT NS (GAUZE/BANDAGES/DRESSINGS) ×2 IMPLANT
TAPE CLOTH SURG 4X10 WHT LF (GAUZE/BANDAGES/DRESSINGS) ×2 IMPLANT
TAPE UMBILICAL COTTON 1/8X30 (MISCELLANEOUS) ×2 IMPLANT
TOWEL OR 17X24 6PK STRL BLUE (TOWEL DISPOSABLE) ×2 IMPLANT
TOWEL OR 17X26 10 PK STRL BLUE (TOWEL DISPOSABLE) ×2 IMPLANT
WATER STERILE IRR 1000ML POUR (IV SOLUTION) ×2 IMPLANT
YANKAUER SUCT BULB TIP NO VENT (SUCTIONS) ×2 IMPLANT

## 2012-12-18 NOTE — H&P (Signed)
PREOPERATIVE H&P  Chief Complaint: left arm pain  HPI: Kylie Hurst is a 67 y.o. female who presents with ongoing pain in the left arm. MRI = NF stenois on left at C7/T1. Patient failed conservative care.  Past Medical History  Diagnosis Date  . Hypertension   . Aortic aneurysm 2011    3.7mm  . Ovarian cyst 06/01/2009    5 cm  . Onycholysis of toenail   . Osteoporosis   . Chronic back pain   . Restless legs   . Back spasm   . Emphysema   . Bulging discs   . Depression   . Diabetes mellitus without complication     fasting 90-110  . Arthritis    Past Surgical History  Procedure Laterality Date  . Colonoscopy     History   Social History  . Marital Status: Divorced    Spouse Name: N/A    Number of Children: N/A  . Years of Education: N/A   Social History Main Topics  . Smoking status: Current Every Day Smoker -- 0.50 packs/day for 47 years    Types: Cigarettes  . Smokeless tobacco: Never Used  . Alcohol Use: No  . Drug Use: No  . Sexual Activity: Not on file   Other Topics Concern  . Not on file   Social History Narrative  . No narrative on file   No family history on file. No Known Allergies Prior to Admission medications   Medication Sig Start Date End Date Taking? Authorizing Provider  acetaminophen (TYLENOL) 500 MG tablet Take 500-1,000 mg by mouth every 8 (eight) hours as needed (pain).    Yes Historical Provider, MD  alendronate (FOSAMAX) 70 MG tablet Take 70 mg by mouth every Saturday. Take with a full glass of water on an empty stomach.   Yes Historical Provider, MD  Ascorbic Acid (VITAMIN C) 1000 MG tablet Take 1,000-2,000 mg by mouth See admin instructions. Patient takes one tablet (500 mg) daily during Spring, Summer, and Fall. Patient takes one tablet (500 mg) twice daily in the Winter.   Yes Historical Provider, MD  calcium-vitamin D (OSCAL WITH D) 500-200 MG-UNIT per tablet Take 1 tablet by mouth 2 (two) times daily.   Yes Historical Provider,  MD  cetirizine (ZYRTEC) 10 MG tablet Take 10 mg by mouth at bedtime.   Yes Historical Provider, MD  cyclobenzaprine (FLEXERIL) 10 MG tablet Take 10 mg by mouth 3 (three) times daily as needed for muscle spasms.    Yes Historical Provider, MD  hydrochlorothiazide (HYDRODIURIL) 25 MG tablet Take 12.5 mg by mouth daily.    Yes Historical Provider, MD  HYDROcodone-acetaminophen (NORCO/VICODIN) 5-325 MG per tablet Take 1-2 tablets by mouth every 4 (four) hours as needed for moderate pain.   Yes Historical Provider, MD  lisinopril (PRINIVIL,ZESTRIL) 5 MG tablet Take 5 mg by mouth at bedtime.    Yes Historical Provider, MD  Multiple Vitamins-Minerals (MULTIVITAMIN WITH MINERALS) tablet Take 1 tablet by mouth daily.   Yes Historical Provider, MD  pravastatin (PRAVACHOL) 20 MG tablet Take 20 mg by mouth at bedtime.    Yes Historical Provider, MD  pyridOXINE (VITAMIN B-6) 100 MG tablet Take 100 mg by mouth at bedtime.    Yes Historical Provider, MD  sitaGLIPtin (JANUVIA) 50 MG tablet Take 50 mg by mouth daily.   Yes Historical Provider, MD  terbinafine (LAMISIL) 250 MG tablet Take 250 mg by mouth at bedtime.   Yes Historical Provider, MD  varenicline (  CHANTIX) 1 MG tablet Take 1 mg by mouth 2 (two) times daily.   Yes Historical Provider, MD  aspirin 81 MG tablet Take 81 mg by mouth at bedtime.     Historical Provider, MD  fish oil-omega-3 fatty acids 1000 MG capsule Take 1 g by mouth at bedtime.    Historical Provider, MD  meloxicam (MOBIC) 15 MG tablet Take 15 mg by mouth at bedtime.    Historical Provider, MD     All other systems have been reviewed and were otherwise negative with the exception of those mentioned in the HPI and as above.  Physical Exam: There were no vitals filed for this visit.  General: Alert, no acute distress Cardiovascular: No pedal edema Respiratory: No cyanosis, no use of accessory musculature Skin: No lesions in the area of chief complaint Neurologic: Sensation intact  distally Psychiatric: Patient is competent for consent with normal mood and affect Lymphatic: No axillary or cervical lymphadenopathy  MUSCULOSKELETAL: + spurling's on left  Assessment/Plan: Left arm pain Plan for Procedure(s): ANTERIOR CERVICAL DECOMPRESSION/DISCECTOMY FUSION C7/T1   Emilee Hero, MD 12/18/2012 8:13 AM

## 2012-12-18 NOTE — Preoperative (Signed)
Beta Blockers   Reason not to administer Beta Blockers:Not Applicable 

## 2012-12-18 NOTE — Anesthesia Preprocedure Evaluation (Signed)
Anesthesia Evaluation  Patient identified by MRN, date of birth, ID band Patient awake    Reviewed: Allergy & Precautions, H&P , NPO status , Patient's Chart, lab work & pertinent test results  Airway       Dental   Pulmonary COPDCurrent Smoker,          Cardiovascular hypertension, + Peripheral Vascular Disease     Neuro/Psych Depression    GI/Hepatic   Endo/Other  diabetes, Type 2, Oral Hypoglycemic Agents  Renal/GU      Musculoskeletal   Abdominal   Peds  Hematology   Anesthesia Other Findings   Reproductive/Obstetrics                           Anesthesia Physical Anesthesia Plan  ASA: III  Anesthesia Plan: General   Post-op Pain Management:    Induction: Intravenous  Airway Management Planned: Oral ETT  Additional Equipment:   Intra-op Plan:   Post-operative Plan: Extubation in OR  Informed Consent: I have reviewed the patients History and Physical, chart, labs and discussed the procedure including the risks, benefits and alternatives for the proposed anesthesia with the patient or authorized representative who has indicated his/her understanding and acceptance.     Plan Discussed with: CRNA, Anesthesiologist and Surgeon  Anesthesia Plan Comments:         Anesthesia Quick Evaluation

## 2012-12-18 NOTE — Transfer of Care (Signed)
Immediate Anesthesia Transfer of Care Note  Patient: Kylie Hurst  Procedure(s) Performed: Procedure(s) with comments: ANTERIOR CERVICAL DECOMPRESSION/DISCECTOMY FUSION 1 LEVEL (N/A) - Anterior cervical decompression fusion, cervical 7 - thoracic 1 with instrumentation, allograft  Patient Location: PACU  Anesthesia Type:General  Level of Consciousness: sedated and patient cooperative  Airway & Oxygen Therapy: Patient Spontanous Breathing  Post-op Assessment: Report given to PACU RN and Post -op Vital signs reviewed and stable  Post vital signs: Reviewed and stable  Complications: No apparent anesthesia complications

## 2012-12-18 NOTE — Anesthesia Postprocedure Evaluation (Signed)
  Anesthesia Post-op Note  Patient: Kylie Hurst  Procedure(s) Performed: Procedure(s) with comments: ANTERIOR CERVICAL DECOMPRESSION/DISCECTOMY FUSION 1 LEVEL (N/A) - Anterior cervical decompression fusion, cervical 7 - thoracic 1 with instrumentation, allograft  Patient Location: PACU  Anesthesia Type:General  Level of Consciousness: awake, alert , oriented and patient cooperative  Airway and Oxygen Therapy: Patient Spontanous Breathing  Post-op Pain: mild  Post-op Assessment: Post-op Vital signs reviewed, Patient's Cardiovascular Status Stable, Respiratory Function Stable, Patent Airway, No signs of Nausea or vomiting and Pain level controlled  Post-op Vital Signs: stable  Complications: No apparent anesthesia complications

## 2012-12-19 MED ORDER — HYDROCHLOROTHIAZIDE 12.5 MG PO CAPS
12.5000 mg | ORAL_CAPSULE | Freq: Every day | ORAL | Status: DC
  Filled 2012-12-19: qty 1

## 2012-12-19 MED ORDER — PNEUMOCOCCAL VAC POLYVALENT 25 MCG/0.5ML IJ INJ: 0.5000 mL | INJECTION | INTRAMUSCULAR | Status: DC

## 2012-12-19 MED ORDER — PNEUMOCOCCAL VAC POLYVALENT 25 MCG/0.5ML IJ INJ
0.5000 mL | INJECTION | INTRAMUSCULAR | Status: AC
  Administered 2012-12-19: 0.5 mL via INTRAMUSCULAR
  Filled 2012-12-19: qty 0.5

## 2012-12-19 NOTE — Progress Notes (Signed)
Patient doing well. Has been tolerating PO, including dinner last night. Patient reports resolution of left arm pain.  BP 114/68  Pulse 92  Temp(Src) 98.7 F (37.1 C) (Oral)  Resp 16  SpO2 98%  NVI Collar in place - fitting appropriately Neck soft/supple  POD #1 after C7/T1 ACDF, doing well  - patient able to be d/c'ed home today - f/u in clinic in 2 weeks - patient again advised to stop smoking

## 2012-12-19 NOTE — Progress Notes (Signed)
UR COMPLETED  

## 2012-12-19 NOTE — Progress Notes (Signed)
Orthopedic Tech Progress Note Patient Details:  Kylie Hurst Jun 01, 1945 [id number]  Ortho Devices Type of Ortho Device: [place] cervical collar Ortho Device/Splint Interventions: Application   Shawnie Pons 12/19/2012, 3:15 PM

## 2012-12-20 ENCOUNTER — Encounter (HOSPITAL_COMMUNITY): Payer: Self-pay | Admitting: Orthopedic Surgery

## 2012-12-20 NOTE — Op Note (Signed)
NAMEELWYN, LOWDEN              ACCOUNT NO.:  [id number]  MEDICAL RECORD NO.:  [id number]  LOCATION:                               FACILITY:  [facility]  PHYSICIAN:  Estill Bamberg, MD      DATE OF BIRTH:  April 04, 1945  DATE OF PROCEDURE:  12/18/2012 DATE OF DISCHARGE:  12/19/2012                              OPERATIVE REPORT   PREOPERATIVE DIAGNOSES: 1. Left-sided C8 radiculopathy. 2. Severe C7-T1 degenerative disk disease with a large left-sided C7-     T1 disk herniation causing compression of the exiting C8 nerve.  POSTOPERATIVE DIAGNOSES: 1. Left-sided C8 radiculopathy. 2. Severe C7-T1 degenerative disk disease with a large left-sided C7-     T1 disk herniation causing compression of the exiting C8 nerve.  PROCEDURE: 1. Anterior cervical decompression and fusion, C7-T1. 2. Insertion of interbody device x1 (Titan interbody spacer, 6 mm,     small, lordotic). 3. Use of morselized allograft. 4. Placement of anterior instrumentation, C7, T1. 5. Intraoperative use of fluoroscopy.  SURGEON:  Estill Bamberg, MD.  ASSISTANTJason Coop, [facility]  ANESTHESIA:  General endotracheal anesthesia.  COMPLICATIONS:  None.  DISPOSITION:  Stable.  ESTIMATED BLOOD LOSS:  Minimal.  INDICATIONS FOR PROCEDURE:  Briefly, Ms. Sutphen is a pleasant 67 year old female, who did present to me with severe pain in her left arm.  The distribution of her pain was very much in the distribution of the C8 nerve on the left side.  The patient's MRI and her cervical spine was notable for multiple different findings.  Relative to the patient's chief complaint, there was a large left-sided C7-T1 disk herniation, which was clearly causing compression of the exiting C8 nerve on the left side.  There was also noted to be left-sided spinal cord compression.  Given the patient's ongoing pain and dysfunction, we did discuss proceeding with the procedure reflected above.  The patient did fully understand the  risks and limitations of the procedure as outlined in my preoperative note.  OPERATIVE DETAILS:  On December 18, 2012, the patient was brought to Surgery and general endotracheal anesthesia was administered.  The patient was placed supine on a hospital bed.  The neck was placed in a gentle degree of extension.  Neck was then prepped and draped in the usual sterile fashion.  Antibiotics were given and a time-out procedure was performed.  I then made a left-sided transverse incision overlying the C7-T1 interspace.  The platysma was sharply incised.  The plane between the sternocleidomastoid muscle and the strap muscle were identified and explored.  The anterior cervical spine was readily noted. I then obtained an intraoperative fluoroscopic view to confirm the appropriate operative level.  I then placed a self-retaining Shadow-Line retractor.  I then subperiosteally exposed the vertebral bodies of C7 and T1.  Caspar pins were placed and distraction was applied.  I then removed the entirety of the C7-T1 intervertebral disk.  Of note, upon encountering the posterior aspect of the intervertebral disk space, the posterior longitudinal ligament was identified and was removed using a Kerrison punch.  Of note, there was noted to be a large extruded C7-T1 disk fragment causing compression of the left hemicord  and the exiting C8 nerve.  This was removed in 4 moderate-sized fragments.  At the termination of this portion of the procedure, I was able to confirm adequate decompression of the spinal canal and the exiting C8 nerve on the left.  I then proceeded with preparing the endplates.  The endplates were prepared using a series of curettes.  I then placed a series of trials and that I chose the appropriate size interbody spacer, which was packed with DBX mix and tamped into position in the usual fashion.  The Caspar pins were then removed and bone wax placed in its place.  A 14-mm anterior cervical  plate was applied over the anterior spine.  A 14-mm self-tapping variable angle screws were placed, 2 in each vertebral body at C7 and T1 for a total of 4 screws.  The wound was then copiously irrigated.  I then obtained the lateral and AP intraoperative fluoroscopic view to confirm appropriate positioning of the implants of the hardware.  I was very pleased with the construct.  I then explored the wound for any undue bleeding.  There were minor areas of paravertebral bleeding encountered, which were addressed using bipolar electrocautery.  I then closed the platysma using 2-0 Vicryl.  I then closed the skin using 3-0 Monocryl.  Benzoin and Steri-Strips were applied followed by sterile dressing.  All instrument counts were correct at the termination of the procedure.  Of note, Jason Coop, was my assistant throughout the entirety of the procedure, and did aid in essential retraction and suctioning needed throughout the entirety of the surgery.  Of note, the patient does smoke regularly.  I did discuss with the patient that her smoking status does increase the risk of a nonunion. She did understand this prior to proceeding with surgery.     Estill Bamberg, MD     MD/MEDQ  D:  12/18/2012  T:  12/19/2012  Job:  [id number]  cc:   Lerry Liner, MD

## 2012-12-25 NOTE — Discharge Summary (Signed)
Patient ID: Kylie Hurst MRN: [id number] DOB/AGE: 1945/04/24 67 y.o.  Admit date: 12/18/2012 Discharge date: 12/20/2012  Admission Diagnoses:  Radiculopathy  Discharge Diagnoses:  Same  Past Medical History  Diagnosis Date  . Hypertension   . Aortic aneurysm 2011    3.66mm  . Ovarian cyst 06/01/2009    5 cm  . Onycholysis of toenail   . Osteoporosis   . Chronic back pain   . Restless legs   . Back spasm   . Emphysema   . Bulging discs   . Depression   . Diabetes mellitus without complication     fasting 90-110  . Arthritis     Surgeries: Procedure(s): ANTERIOR CERVICAL DECOMPRESSION/DISCECTOMY FUSION 1 LEVEL C7-T1 on 12/18/2012   Consultants:  None  Discharged Condition: Improved  Hospital Course: Kylie Hurst is an 67 y.o. female who was admitted 12/18/2012 for operative treatment of Radiculopathy. Patient has severe unremitting pain that affects sleep, daily activities, and work/hobbies. After pre-op clearance the patient was taken to the operating room on 12/18/2012 and underwent  Procedure(s): ANTERIOR CERVICAL DECOMPRESSION/DISCECTOMY FUSION 1 LEVEL C7-T1.    Patient was given perioperative antibiotics:  Anti-infectives   Start     Dose/Rate Route Frequency Ordered Stop   12/18/12 2200  terbinafine (LAMISIL) tablet 250 mg  Status:  Discontinued     250 mg Oral Daily at bedtime 12/18/12 1706 12/19/12 1748   12/18/12 2100  ceFAZolin (ANCEF) IVPB 1 g/50 mL premix     1 g 100 mL/hr over 30 Minutes Intravenous Every 8 hours 12/18/12 1706 12/19/12 0550   12/18/12 0954  ceFAZolin (ANCEF) 2-3 GM-% IVPB SOLR    Comments:  Rogelia Mire   : cabinet override      12/18/12 0954 12/18/12 2159   12/18/12 0600  ceFAZolin (ANCEF) IVPB 2 g/50 mL premix     2 g 100 mL/hr over 30 Minutes Intravenous On call to O.R. 12/17/12 1445 12/18/12 1252       Patient was given sequential compression devices, early ambulation to prevent DVT.  Patient benefited maximally  from hospital stay and there were no complications.    Recent vital signs: BP 116/66  Pulse 92  Temp(Src) 98.4 F (36.9 C) (Oral)  Resp 16  SpO2 94%  Discharge Medications:     Medication List    STOP taking these medications       acetaminophen 500 MG tablet  Commonly known as:  TYLENOL     aspirin 81 MG tablet     cyclobenzaprine 10 MG tablet  Commonly known as:  FLEXERIL     fish oil-omega-3 fatty acids 1000 MG capsule     HYDROcodone-acetaminophen 5-325 MG per tablet  Commonly known as:  NORCO/VICODIN     meloxicam 15 MG tablet  Commonly known as:  MOBIC      TAKE these medications       alendronate 70 MG tablet  Commonly known as:  FOSAMAX  Take 70 mg by mouth every Saturday. Take with a full glass of water on an empty stomach.     calcium-vitamin D 500-200 MG-UNIT per tablet  Commonly known as:  OSCAL WITH D  Take 1 tablet by mouth 2 (two) times daily.     cetirizine 10 MG tablet  Commonly known as:  ZYRTEC  Take 10 mg by mouth at bedtime.     hydrochlorothiazide 25 MG tablet  Commonly known as:  HYDRODIURIL  Take 12.5 mg by mouth daily.  lisinopril 5 MG tablet  Commonly known as:  PRINIVIL,ZESTRIL  Take 5 mg by mouth at bedtime.     multivitamin with minerals tablet  Take 1 tablet by mouth daily.     pravastatin 20 MG tablet  Commonly known as:  PRAVACHOL  Take 20 mg by mouth at bedtime.     pyridOXINE 100 MG tablet  Commonly known as:  VITAMIN B-6  Take 100 mg by mouth at bedtime.     sitaGLIPtin 50 MG tablet  Commonly known as:  JANUVIA  Take 50 mg by mouth daily.     terbinafine 250 MG tablet  Commonly known as:  LAMISIL  Take 250 mg by mouth at bedtime.     varenicline 1 MG tablet  Commonly known as:  CHANTIX  Take 1 mg by mouth 2 (two) times daily.     vitamin C 1000 MG tablet  Take 1,000-2,000 mg by mouth See admin instructions. Patient takes one tablet (500 mg) daily during Spring, Summer, and Fall. Patient takes one  tablet (500 mg) twice daily in the Winter.        Diagnostic Studies: Dg Cervical Spine 2-3 Views  12/18/2012   CLINICAL DATA:  Anterior cervical decompression with fusion  EXAM: CERVICAL SPINE - 2-3 VIEW  COMPARISON:  Earlier the same day  FINDINGS: Two intraoperative spot fluoro films are submitted after the procedure for review. Patient is status post anterior cervical diskectomy at C7-T1 with anterior plating. The caudal extent of the plate in the screw at T1 are not well visualized secondary to superimposition of the shoulders on these lateral films. Alignment does appear grossly anatomic. Surgical sponge projects over the anterior lower neck.  IMPRESSION: Status post ACDF with anterior plate at Z6-X0. Surgical hardware not well visualized at the T1 level secondary to superimposition of soft tissues.   Electronically Signed   By: Kennith Center M.D.   On: 12/18/2012 16:18   Dg Cervical Spine Complete  12/18/2012   CLINICAL DATA:  ACDF C7-T1  EXAM: CERVICAL SPINE  4+ VIEWS  COMPARISON:  Portable cross-table intraoperative lateral view at 1304 hr compared to MRI of 11/23/2012.  FINDINGS: Bones appear demineralized.  Tip of a metallic instrument projects anterior to the upper to mid C7 vertebral body.  Disc space narrowing with endplate spur formation at C6-C7.  Anterior support tubes noted.  IMPRESSION: Anterior localization of upper to mid C7 vertebral body.   Electronically Signed   By: Ulyses Southward M.D.   On: 12/18/2012 15:31    Disposition: 01-Home or Self Care      Discharge Orders   Future Appointments Provider Department Dept Phone   01/13/2013 2:40 PM Cpr-Prma Nurse [facility] [phone]   07/14/2014 8:30 AM [facility] [address] [phone]   Eat a light meal the night before the exam but please avoid gaseous foods.   Nothing to eat or drink for at least 8 hours prior to the exam. No gum chewing or smoking the  morning of the exam. Please take your morning medications with small sips of water, especially blood pressure medication. If you have several vascular lab exams and will see physician, please bring a snack with you.   07/14/2014 9:00 AM Pryor Ochoa, MD [facility] [phone]   Future Orders Complete By Expires   Discharge patient  As directed        POD #1 after C7/T1 ACDF, doing  well  - patient able to be d/c'ed home today  - f/u in clinic in 2 weeks  - patient again advised to stop smoking  Signed: Georga Bora 12/25/2012, 10:31 AM

## 2013-01-03 ENCOUNTER — Ambulatory Visit: Payer: Medicare Other | Admitting: Physical Medicine & Rehabilitation

## 2013-02-27 ENCOUNTER — Ambulatory Visit: Payer: Medicare Other | Admitting: Physical Medicine and Rehabilitation

## 2013-02-27 ENCOUNTER — Other Ambulatory Visit (HOSPITAL_COMMUNITY): Payer: Self-pay | Admitting: Specialist

## 2013-02-27 DIAGNOSIS — Z1231 Encounter for screening mammogram for malignant neoplasm of breast: Secondary | ICD-10-CM

## 2013-03-05 ENCOUNTER — Ambulatory Visit (HOSPITAL_COMMUNITY)
Admission: RE | Admit: 2013-03-05 | Discharge: 2013-03-05 | Disposition: A | Discharge status: Home / Self Care | Payer: Medicare PPO | Source: Ambulatory Visit | Attending: Specialist | Admitting: Specialist

## 2013-03-05 DIAGNOSIS — Z1231 Encounter for screening mammogram for malignant neoplasm of breast: Secondary | ICD-10-CM

## 2013-03-21 ENCOUNTER — Ambulatory Visit: Payer: Medicare Other | Admitting: Physical Medicine & Rehabilitation

## 2013-04-21 ENCOUNTER — Encounter: Payer: Medicare PPO | Attending: Physical Medicine and Rehabilitation

## 2013-04-21 ENCOUNTER — Ambulatory Visit (HOSPITAL_BASED_OUTPATIENT_CLINIC_OR_DEPARTMENT_OTHER): Payer: Medicare PPO | Admitting: Physical Medicine & Rehabilitation

## 2013-04-21 ENCOUNTER — Encounter: Payer: Self-pay | Admitting: Physical Medicine & Rehabilitation

## 2013-04-21 VITALS — BP 154/88 | HR 73 | Resp 14 | Ht <= 58 in | Wt 116.0 lb

## 2013-04-21 DIAGNOSIS — M47817 Spondylosis without myelopathy or radiculopathy, lumbosacral region: Secondary | ICD-10-CM

## 2013-04-21 DIAGNOSIS — Q062 Diastematomyelia: Secondary | ICD-10-CM | POA: Insufficient documentation

## 2013-04-21 DIAGNOSIS — M4317 Spondylolisthesis, lumbosacral region: Secondary | ICD-10-CM

## 2013-04-21 DIAGNOSIS — Q762 Congenital spondylolisthesis: Secondary | ICD-10-CM

## 2013-04-21 DIAGNOSIS — M47812 Spondylosis without myelopathy or radiculopathy, cervical region: Secondary | ICD-10-CM | POA: Insufficient documentation

## 2013-04-21 MED ORDER — TRAMADOL HCL 50 MG PO TABS: 50.0000 mg | ORAL_TABLET | Freq: Two times a day (BID) | ORAL | Status: DC | PRN

## 2013-04-21 NOTE — Progress Notes (Signed)
Subjective:    Patient ID: Kylie Hurst, female    DOB: Jul 11, 1945, 68 y.o.   MRN: [id number]  HPI The patient is a 68 year old female, history of L5-S1 spondylolisthesis with chronic low back pain. Also history of cervical radiculopathy and underwent C7-T1 ACDF in November of 2014. Last PA visit was prior to surgery Interval history obtained from patient. Off of narcotic analgesics such as hydrocodone and oxycodone postop Has been released by orthopedic surgeon. Currently taking tramadol 1 tablet per day but feels like she could use more. Also taking Mobic 1 tablet a day since November. We discussed that long-term use can lead to kidney failure as well as ulcers. In addition it may reduce the efficacy of baby aspirin for stroke prevention  Complains of numbness in both feet. She thinks one of her physicians told her it was related to her back. She is also diabetic but states that her diabetic control is good Average Pain 1 Pain Right Now 1 My pain is n/a  In the last 24 hours, has pain interfered with the following? General activity 0 Relation with others 0 Enjoyment of life 0 What TIME of day is your pain at its worst? evening Sleep (in general) Fair  Pain is worse with: sitting Pain improves with: rest and medication Relief from Meds: 8  Mobility walk without assistance ability to climb steps?  yes do you drive?  yes  Function not employed: date last employed .  Neuro/Psych numbness spasms  Prior Studies x-rays CT/MRI  Physicians involved in your care Dr Gwenevere Ghazi, Dr Lynann Bologna   History reviewed. No pertinent family history. History   Social History  . Marital Status: Divorced    Spouse Name: N/A    Number of Children: N/A  . Years of Education: N/A   Social History Main Topics  . Smoking status: Current Every Day Smoker -- 0.50 packs/day for 47 years    Types: Cigarettes  . Smokeless tobacco: Never Used  . Alcohol Use: No  . Drug Use: No  .  Sexual Activity: None   Other Topics Concern  . None   Social History Narrative  . None   Past Surgical History  Procedure Laterality Date  . Colonoscopy    . Anterior cervical decomp/discectomy fusion N/A 12/18/2012    Procedure: ANTERIOR CERVICAL DECOMPRESSION/DISCECTOMY FUSION 1 LEVEL;  Surgeon: Sinclair Ship, MD;  Location: [facility];  Service: Orthopedics;  Laterality: N/A;  Anterior cervical decompression fusion, cervical 7 - thoracic 1 with instrumentation, allograft   Past Medical History  Diagnosis Date  . Hypertension   . Aortic aneurysm 2011    3.59mm  . Ovarian cyst 06/01/2009    5 cm  . Onycholysis of toenail   . Osteoporosis   . Chronic back pain   . Restless legs   . Back spasm   . Emphysema   . Bulging discs   . Depression   . Diabetes mellitus without complication     fasting 90-110  . Arthritis    BP 154/88  Pulse 73  Resp 14  Ht 4\' 9"  (1.448 m)  Wt 116 lb (52.617 kg)  BMI 25.10 kg/m2  SpO2 96%  Opioid Risk Score:   Fall Risk Score: Moderate Fall Risk (6-13 points) (patient educated handout given)   Review of Systems  Respiratory: Positive for cough and wheezing.   Gastrointestinal: Positive for constipation.  Neurological: Positive for numbness.  All other systems reviewed and are negative.  Objective:   Physical Exam  Elderly female in no acute distress Mood and affect are appropriate Back is no tenderness palpation She has good range of motion and lumbar spine with flexion, extension, lateral bending and rotation.     Assessment & Plan:  1.Cervical spondylosis, LUE pain and radiating Sx into the LUE in a C7 distribution, 2.Lumbar spondylosis, and Spondylolisthesis at L5-S1 3.Diastematomyelia, per patient, after having MRIs of her L- and C-spine, reviewed CT of the abdomen and pelvis and this was obvious, no evidence of spinal stenosis 4. Lower extremity numbness with history of diabetes as well as spondylolisthesis, will  evaluate with EMG/NCV Patient is on Flexeril 10mg  daily. patient to try to hold this, Tramadol 50mg  daily will increase to twice a day, meloxicam 15 mg a day we'll discontinue for reasons noted above   Over half of the 25 min visit was spent counseling and coordinating care.

## 2013-04-21 NOTE — Patient Instructions (Signed)
Tramadol 50mg  twice a day Stop meloxicam and naproxen May take up to 5 tablets of extra strength tylenol per day maximum

## 2013-05-30 ENCOUNTER — Encounter: Payer: Self-pay | Admitting: Physical Medicine & Rehabilitation

## 2013-05-30 ENCOUNTER — Ambulatory Visit (HOSPITAL_BASED_OUTPATIENT_CLINIC_OR_DEPARTMENT_OTHER): Payer: 59 | Admitting: Physical Medicine & Rehabilitation

## 2013-05-30 ENCOUNTER — Encounter: Payer: PRIVATE HEALTH INSURANCE | Attending: Physical Medicine and Rehabilitation

## 2013-05-30 VITALS — BP 135/84 | HR 84 | Resp 17 | Ht <= 58 in | Wt 118.0 lb

## 2013-05-30 DIAGNOSIS — M5416 Radiculopathy, lumbar region: Secondary | ICD-10-CM

## 2013-05-30 DIAGNOSIS — R209 Unspecified disturbances of skin sensation: Secondary | ICD-10-CM

## 2013-05-30 DIAGNOSIS — E1140 Type 2 diabetes mellitus with diabetic neuropathy, unspecified: Secondary | ICD-10-CM | POA: Insufficient documentation

## 2013-05-30 DIAGNOSIS — Q062 Diastematomyelia: Secondary | ICD-10-CM | POA: Insufficient documentation

## 2013-05-30 DIAGNOSIS — IMO0002 Reserved for concepts with insufficient information to code with codable children: Secondary | ICD-10-CM

## 2013-05-30 DIAGNOSIS — M47812 Spondylosis without myelopathy or radiculopathy, cervical region: Secondary | ICD-10-CM | POA: Insufficient documentation

## 2013-05-30 DIAGNOSIS — M47817 Spondylosis without myelopathy or radiculopathy, lumbosacral region: Secondary | ICD-10-CM | POA: Insufficient documentation

## 2013-05-30 HISTORY — DX: Radiculopathy, lumbar region: M54.16

## 2013-05-30 HISTORY — DX: Type 2 diabetes mellitus with diabetic neuropathy, unspecified: E11.40

## 2013-05-30 MED ORDER — CYCLOBENZAPRINE HCL 7.5 MG PO TABS: 7.5000 mg | ORAL_TABLET | Freq: Three times a day (TID) | ORAL | Status: DC | PRN

## 2013-05-30 NOTE — Patient Instructions (Signed)
Discussed EMG results at next visit

## 2013-05-30 NOTE — Progress Notes (Signed)
EMG performed 05/30/2013.  See EMG report under media tab.

## 2013-06-13 ENCOUNTER — Telehealth: Payer: Self-pay

## 2013-06-13 NOTE — Telephone Encounter (Signed)
Patient states that the [facility] does not have a refill for Cyclobenzaprine 7.5 mg. Contacted the pharmacy and they do have the refill. Notified the patient.

## 2013-06-17 ENCOUNTER — Telehealth: Payer: Self-pay | Admitting: *Deleted

## 2013-06-17 ENCOUNTER — Telehealth: Payer: Self-pay

## 2013-06-17 NOTE — Telephone Encounter (Signed)
yes

## 2013-06-17 NOTE — Telephone Encounter (Signed)
Kylie Hurst called because she has needed to take her flexeril twice a day and was only given # 30 1 q 8hr prn.  Can we refill this med and increase disp to #60?

## 2013-06-17 NOTE — Telephone Encounter (Signed)
Patient is requesting a call to clarify quantity/directions for Fexmid. Attempted to contact patient. Left a voicemail to return call to clinic.

## 2013-06-18 MED ORDER — CYCLOBENZAPRINE HCL 7.5 MG PO TABS: 7.5000 mg | ORAL_TABLET | Freq: Three times a day (TID) | ORAL | Status: DC | PRN

## 2013-06-18 NOTE — Telephone Encounter (Signed)
I talked with Kylie Hurst and she is checking her new insurance may not cover the cyclobenzaprine.  She will let us know.  I did tell her that we went a head and sent it in to the pharmacy for her.

## 2013-06-18 NOTE — Telephone Encounter (Signed)
Cyclobenzaprine increased to disp # 60 for the month so that she can take 2 x day.  Left VM to call office so that she can be notified it was sent to pharmacy.

## 2013-06-20 ENCOUNTER — Telehealth: Payer: Self-pay

## 2013-06-20 NOTE — Telephone Encounter (Signed)
Patient states that her insurance will not cover the Fexmid, but that the will cover Cyclobenzaprine 7.5 mg. Informed the patient that cyclobenzaprine 7.5 mg is the generic for Fexmid. Contacted pharmacy and they stated that the insurance will cover the RX on 5/23 and it will cost her 2 cents. Contacted patient to inform her of this information.

## 2013-07-04 ENCOUNTER — Ambulatory Visit (HOSPITAL_BASED_OUTPATIENT_CLINIC_OR_DEPARTMENT_OTHER): Payer: PRIVATE HEALTH INSURANCE | Admitting: Physical Medicine & Rehabilitation

## 2013-07-04 ENCOUNTER — Encounter: Payer: PRIVATE HEALTH INSURANCE | Attending: Physical Medicine and Rehabilitation

## 2013-07-04 ENCOUNTER — Encounter: Payer: Self-pay | Admitting: Physical Medicine & Rehabilitation

## 2013-07-04 VITALS — BP 118/75 | HR 82 | Resp 14 | Ht <= 58 in | Wt 115.0 lb

## 2013-07-04 DIAGNOSIS — M47812 Spondylosis without myelopathy or radiculopathy, cervical region: Secondary | ICD-10-CM | POA: Insufficient documentation

## 2013-07-04 DIAGNOSIS — M47817 Spondylosis without myelopathy or radiculopathy, lumbosacral region: Secondary | ICD-10-CM

## 2013-07-04 DIAGNOSIS — E1140 Type 2 diabetes mellitus with diabetic neuropathy, unspecified: Secondary | ICD-10-CM

## 2013-07-04 DIAGNOSIS — M5416 Radiculopathy, lumbar region: Secondary | ICD-10-CM

## 2013-07-04 DIAGNOSIS — Q762 Congenital spondylolisthesis: Secondary | ICD-10-CM

## 2013-07-04 DIAGNOSIS — IMO0002 Reserved for concepts with insufficient information to code with codable children: Secondary | ICD-10-CM

## 2013-07-04 DIAGNOSIS — E1142 Type 2 diabetes mellitus with diabetic polyneuropathy: Secondary | ICD-10-CM

## 2013-07-04 DIAGNOSIS — Q062 Diastematomyelia: Secondary | ICD-10-CM | POA: Insufficient documentation

## 2013-07-04 DIAGNOSIS — M4317 Spondylolisthesis, lumbosacral region: Secondary | ICD-10-CM

## 2013-07-04 DIAGNOSIS — E1149 Type 2 diabetes mellitus with other diabetic neurological complication: Secondary | ICD-10-CM

## 2013-07-04 NOTE — Progress Notes (Signed)
Subjective:    Patient ID: Kylie Hurst, female    DOB: 1945/08/30, 68 y.o.   MRN: [id number]  HPI The patient is a 68 year old female, history of L5-S1 spondylolisthesis with chronic low back pain. Also history of cervical radiculopathy and underwent C7-T1 ACDF in November of 2014. Last PA visit was prior to surgery Interval history obtained from patient. Off of narcotic analgesics such as hydrocodone and oxycodone postop Has been released by orthopedic surgeon. Currently taking tramadol 1 tablet twice a day working fairly well but also taking cyclobenzaprine.  Having insurance issues with cyclobenzaprine. Insurance states that cyclobenzaprine is not on formulary, information on Epic program recommends 7.5 mg cyclobenzaprine as the formulary choice however at the pharmacy this was not covered by insurance. Reviewed insurance letters from [place] [employer], other suggested options include nonsteroidal anti-inflammatories which are not in the same classification and cyclobenzaprine. Another option is Zanaflex Pain Inventory Average Pain 7 Pain Right Now 6 My pain is dull and tingling  In the last 24 hours, has pain interfered with the following? General activity 2 Relation with others 1 Enjoyment of life 2 What TIME of day is your pain at its worst? night Sleep (in general) Fair  Pain is worse with: some activites Pain improves with: rest and medication Relief from Meds: 7  Mobility walk without assistance ability to climb steps?  yes do you drive?  yes Do you have any goals in this area?  yes  Function not employed: date last employed . retired Do you have any goals in this area?  yes  Neuro/Psych numbness tingling spasms  Prior Studies Any changes since last visit?  no  Physicians involved in your care Any changes since last visit?  no   History reviewed. No pertinent family history. History   Social History  . Marital Status: Divorced    Spouse Name:  N/A    Number of Children: N/A  . Years of Education: N/A   Social History Main Topics  . Smoking status: Current Every Day Smoker -- 0.50 packs/day for 47 years    Types: Cigarettes  . Smokeless tobacco: Never Used  . Alcohol Use: No  . Drug Use: No  . Sexual Activity: None   Other Topics Concern  . None   Social History Narrative  . None   Past Surgical History  Procedure Laterality Date  . Colonoscopy    . Anterior cervical decomp/discectomy fusion N/A 12/18/2012    Procedure: ANTERIOR CERVICAL DECOMPRESSION/DISCECTOMY FUSION 1 LEVEL;  Surgeon: Sinclair Ship, MD;  Location: [facility];  Service: Orthopedics;  Laterality: N/A;  Anterior cervical decompression fusion, cervical 7 - thoracic 1 with instrumentation, allograft   Past Medical History  Diagnosis Date  . Hypertension   . Aortic aneurysm 2011    3.104mm  . Ovarian cyst 06/01/2009    5 cm  . Onycholysis of toenail   . Osteoporosis   . Chronic back pain   . Restless legs   . Back spasm   . Emphysema   . Bulging discs   . Depression   . Diabetes mellitus without complication     fasting 90-110  . Arthritis    BP 118/75  Pulse 82  Resp 14  Ht 4\' 9"  (1.448 m)  Wt 115 lb (52.164 kg)  BMI 24.88 kg/m2  SpO2 98%  Opioid Risk Score:   Fall Risk Score: Moderate Fall Risk (6-13 points) (patient educated handout declined)   Review of Systems  Gastrointestinal: Positive for constipation.  Musculoskeletal: Positive for arthralgias and myalgias.  Neurological: Positive for numbness.       Tingling and spasms  All other systems reviewed and are negative.      Objective:   Physical Exam  Decreased sensation both feet. Right ankle dorsiflexor weakness rated at 4/5 Bilateral hip knee flexion extensor strength is 5/5  No tenderness palpation lumbar paraspinal muscle    Assessment & Plan:  1. 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 Radiculopathies at L5-S1 level as  well. Continue tramadol 50 mg twice a day, has good relief and cyclobenzaprine recommend 5 mg each bedtime.  2. Diabetic peripheral neuropathy causing bilateral foot numbness. This is diagnosed on EMG testing  Considered gabapentin if the peripheral neuropathy becomes more painful  Nurse practitioner visit in 3 months

## 2013-07-04 NOTE — Patient Instructions (Signed)
Evidence of neuropathy related to diabetes. There is also evidence of a pinched nerve which is likely causing the weakness in your right foot and ankle area.

## 2013-07-09 ENCOUNTER — Telehealth: Payer: Self-pay | Admitting: *Deleted

## 2013-07-09 NOTE — Telephone Encounter (Signed)
Notified Ms Vecchio we will change her muscle relaxer to tizanidine 2 mg since her insurance has denied the flexeril.  She was taking the fexmid (cyclobnzaprine) 2 times a day # 60.  Can we make this bid and #60?

## 2013-07-10 MED ORDER — TIZANIDINE HCL 2 MG PO TABS: 2.0000 mg | ORAL_TABLET | Freq: Two times a day (BID) | ORAL | Status: DC

## 2013-07-10 NOTE — Telephone Encounter (Signed)
Tizanidine 2mg  po BID #60 no RF

## 2013-07-10 NOTE — Telephone Encounter (Signed)
Tizanidine e-scribed

## 2013-07-11 ENCOUNTER — Encounter (INDEPENDENT_AMBULATORY_CARE_PROVIDER_SITE_OTHER): Payer: Self-pay | Admitting: Surgery

## 2013-07-11 ENCOUNTER — Ambulatory Visit (INDEPENDENT_AMBULATORY_CARE_PROVIDER_SITE_OTHER): Payer: Medicare Other | Admitting: Surgery

## 2013-07-11 ENCOUNTER — Ambulatory Visit (INDEPENDENT_AMBULATORY_CARE_PROVIDER_SITE_OTHER): Payer: Medicaid Other | Admitting: Surgery

## 2013-07-11 VITALS — BP 128/88 | HR 72 | Temp 98.8°F | Resp 18 | Ht <= 58 in | Wt 115.2 lb

## 2013-07-11 DIAGNOSIS — K439 Ventral hernia without obstruction or gangrene: Secondary | ICD-10-CM | POA: Insufficient documentation

## 2013-07-11 NOTE — Progress Notes (Signed)
Patient ID: Kylie Hurst, female   DOB: 08-18-1945, 68 y.o.   MRN: [id number]  Chief Complaint  Patient presents with  . Hernia    HPI Kylie Hurst is a 68 y.o. female.  Follow-up of last years' visit with Dr. Lilyan Punt for ventral hernia HPI Kylie Hurst is a 68 y.o. female. This patient was referred by Dr. Jimmye Norman last year for evaluation of a left lower quadrant abdominal bulge and ventral hernia. She says that she first noticed this about 2 years ago but it has not caused any discomfort. She says that the knot was the size of a baseball and comes and goes and reduces spontaneously when lying flat. It is not causing discomfort and she has no associated nausea or vomiting. She moves her bowels daily. She also has a known AAA which is just been followed with imaging for now. She continues to smoke.  She recently had neck surgery and has done well from that standpoint.    Past Medical History  Diagnosis Date  . Hypertension   . Aortic aneurysm 2011    3.63mm  . Ovarian cyst 06/01/2009    5 cm  . Onycholysis of toenail   . Osteoporosis   . Chronic back pain   . Restless legs   . Back spasm   . Emphysema   . Bulging discs   . Depression   . Diabetes mellitus without complication     fasting 90-110  . Arthritis   . Neuromuscular disorder   . Hyperlipidemia     Past Surgical History  Procedure Laterality Date  . Colonoscopy    . Anterior cervical decomp/discectomy fusion N/A 12/18/2012    Procedure: ANTERIOR CERVICAL DECOMPRESSION/DISCECTOMY FUSION 1 LEVEL;  Surgeon: Sinclair Ship, MD;  Location: [facility];  Service: Orthopedics;  Laterality: N/A;  Anterior cervical decompression fusion, cervical 7 - thoracic 1 with instrumentation, allograft  . Neck surgery      History reviewed. No pertinent family history.  Social History History  Substance Use Topics  . Smoking status: Current Every Day Smoker -- 0.50 packs/day for 47 years    Types: Cigarettes  . Smokeless  tobacco: Never Used  . Alcohol Use: No    No Known Allergies  Current Outpatient Prescriptions  Medication Sig Dispense Refill  . ACCU-CHEK AVIVA PLUS test strip       . ACCU-CHEK FASTCLIX LANCETS MISC       . alendronate (FOSAMAX) 70 MG tablet Take 70 mg by mouth every Saturday. Take with a full glass of water on an empty stomach.      . Ascorbic Acid (VITAMIN C) 1000 MG tablet Take 1,000-2,000 mg by mouth See admin instructions. Patient takes one tablet (500 mg) daily during Spring, Summer, and Fall. Patient takes one tablet (500 mg) twice daily in the Winter.      Marland Kitchen b complex vitamins tablet Take 1 tablet by mouth daily.      Marland Kitchen BIOTIN PO Take 500 mg by mouth 2 (two) times daily.      . calcium-vitamin D (OSCAL WITH D) 500-200 MG-UNIT per tablet Take 1 tablet by mouth 2 (two) times daily. 600mg /400 units      . cetirizine (ZYRTEC) 10 MG tablet Take 10 mg by mouth at bedtime.      . cyclobenzaprine (FEXMID) 7.5 MG tablet Take 1 tablet (7.5 mg total) by mouth 3 (three) times daily as needed for muscle spasms.  60 tablet  0  . hydrochlorothiazide (  HYDRODIURIL) 25 MG tablet Take 12.5 mg by mouth daily.       Marland Kitchen lisinopril (PRINIVIL,ZESTRIL) 5 MG tablet Take 5 mg by mouth at bedtime.       . metFORMIN (GLUCOPHAGE) 500 MG tablet Take 500 mg by mouth daily.      . Multiple Vitamins-Minerals (MULTIVITAMIN WITH MINERALS) tablet Take 1 tablet by mouth daily.      . Omega-3 Fatty Acids (FISH OIL) 1000 MG CAPS Take by mouth. Fish oil 1000mg /300mg  Omega 3      . pravastatin (PRAVACHOL) 20 MG tablet Take 20 mg by mouth at bedtime.       . pyridOXINE (VITAMIN B-6) 100 MG tablet Take 100 mg by mouth at bedtime.       Marland Kitchen tiZANidine (ZANAFLEX) 2 MG tablet Take 1 tablet (2 mg total) by mouth 2 (two) times daily.  60 tablet  0  . traMADol (ULTRAM) 50 MG tablet Take 1 tablet (50 mg total) by mouth every 12 (twelve) hours as needed.  60 tablet  5   No current facility-administered medications for this visit.     Review of Systems Review of Systems  Constitutional: Negative for fever, chills and unexpected weight change.  HENT: Negative for congestion, hearing loss, sore throat, trouble swallowing and voice change.   Eyes: Negative for visual disturbance.  Respiratory: Negative for cough and wheezing.   Cardiovascular: Negative for chest pain, palpitations and leg swelling.  Gastrointestinal: Negative for nausea, vomiting, abdominal pain, diarrhea, constipation, blood in stool, abdominal distention and anal bleeding.  Genitourinary: Negative for hematuria, vaginal bleeding and difficulty urinating.  Musculoskeletal: Positive for arthralgias and back pain.  Skin: Negative for rash and wound.  Neurological: Negative for seizures, syncope and headaches.  Hematological: Negative for adenopathy. Does not bruise/bleed easily.  Psychiatric/Behavioral: Negative for confusion.    Blood pressure 128/88, pulse 72, temperature 98.8 F (37.1 C), temperature source Temporal, resp. rate 18, height 4\' 9"  (1.448 m), weight 115 lb 3.2 oz (52.254 kg).  Physical Exam Physical Exam WDWN in NAD HEENT:  EOMI, sclera anicteric Neck:  No masses, no thyromegaly Lungs:  CTA bilaterally; normal respiratory effort CV:  Regular rate and rhythm; no murmurs Abd:  +bowel sounds, soft, non-tender, LLQ reducible mass Ext:  Well-perfused; no edema Skin:  Warm, dry; no sign of jaundice  Data Reviewed CT scan Pelvis - 04/30/12. RADIOLOGY REPORT*  Clinical Data: Pelvic pain. Palpable abnormality in left pelvis.  Right ovarian cyst.  CT PELVIS WITH CONTRAST  Technique: Multidetector CT imaging of the pelvis was performed  using the standard protocol following the bolus administration of  intravenous contrast.  Contrast: 110mL OMNIPAQUE IOHEXOL 300 MG/ML SOLN  Comparison: CT on 03/26/2009  Findings: A left lower quadrant ventral hernia is seen containing  a loop of small bowel, which is new since previous study. This   corresponds with the palpable abnormality. There is no evidence of  small bowel obstruction or ischemia.  A benign appearing cyst is again seen in the right adnexa which  measures 2.4 x 3.3 cm, and is not significantly changed in size or  appearance since prior CT. This is consistent with a benign  etiology. Uterus and left adnexa are unremarkable. No other  pelvic masses or lymphadenopathy identified. No evidence of  ascites or inflammatory process.  An infrarenal abdominal aortic aneurysm is seen which measures 4.1  x 3.9 cm, and also shows no significant change compared to prior  study.  IMPRESSION:  1. Left  lower quadrant ventral hernia containing small bowel.  This corresponds to the palpable abnormality. There is no evidence  of small bowel obstruction or ischemia.  2. Stable 3 cm benign appearing right adnexal cyst.  3. Stable 4.1 cm infrarenal abdominal aortic aneurysm. Recommend  continued followup with [place] in 1 year. This recommendation follows  ACR consensus guidelines: White Paper of the ACR Incidental  Findings Committee II on Vascular Findings. J Am Coll Radiol 2013;  10:789-794.  Original Report Authenticated By: Earle Gell, M.D.   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  She has tried Chantix twice unsuccessfully.  She wants to come back in 2 months for reevaluation after she has tried to quit smoking again.    Reevaluate in 2 months.          Saray Capasso K. 07/11/2013, 12:01 PM

## 2013-08-28 ENCOUNTER — Other Ambulatory Visit: Payer: Self-pay

## 2013-08-28 MED ORDER — TIZANIDINE HCL 2 MG PO TABS: 2.0000 mg | ORAL_TABLET | Freq: Two times a day (BID) | ORAL | Status: DC

## 2013-08-28 NOTE — Telephone Encounter (Signed)
Patient is requesting a refill on Tizanidine. Contacted patient to inform her the refill was sent to pharmacy.

## 2013-09-09 ENCOUNTER — Encounter (INDEPENDENT_AMBULATORY_CARE_PROVIDER_SITE_OTHER): Payer: Medicare Other | Admitting: Surgery

## 2013-09-15 ENCOUNTER — Encounter: Payer: Self-pay | Admitting: Physical Medicine & Rehabilitation

## 2013-10-03 ENCOUNTER — Encounter: Payer: PRIVATE HEALTH INSURANCE | Attending: Physical Medicine and Rehabilitation | Admitting: Registered Nurse

## 2013-10-03 ENCOUNTER — Encounter: Payer: Self-pay | Admitting: Registered Nurse

## 2013-10-03 VITALS — BP 105/69 | HR 70 | Resp 20

## 2013-10-03 DIAGNOSIS — M5416 Radiculopathy, lumbar region: Secondary | ICD-10-CM

## 2013-10-03 DIAGNOSIS — M25519 Pain in unspecified shoulder: Secondary | ICD-10-CM | POA: Diagnosis present

## 2013-10-03 DIAGNOSIS — M47817 Spondylosis without myelopathy or radiculopathy, lumbosacral region: Secondary | ICD-10-CM | POA: Diagnosis not present

## 2013-10-03 DIAGNOSIS — Q762 Congenital spondylolisthesis: Secondary | ICD-10-CM

## 2013-10-03 DIAGNOSIS — M47812 Spondylosis without myelopathy or radiculopathy, cervical region: Secondary | ICD-10-CM | POA: Diagnosis not present

## 2013-10-03 DIAGNOSIS — M4317 Spondylolisthesis, lumbosacral region: Secondary | ICD-10-CM

## 2013-10-03 DIAGNOSIS — Q062 Diastematomyelia: Secondary | ICD-10-CM | POA: Diagnosis not present

## 2013-10-03 DIAGNOSIS — E1142 Type 2 diabetes mellitus with diabetic polyneuropathy: Secondary | ICD-10-CM

## 2013-10-03 DIAGNOSIS — IMO0002 Reserved for concepts with insufficient information to code with codable children: Secondary | ICD-10-CM

## 2013-10-03 DIAGNOSIS — E1149 Type 2 diabetes mellitus with other diabetic neurological complication: Secondary | ICD-10-CM

## 2013-10-03 MED ORDER — TRAMADOL HCL 50 MG PO TABS: 50.0000 mg | ORAL_TABLET | Freq: Two times a day (BID) | ORAL | Status: DC | PRN

## 2013-10-03 NOTE — Progress Notes (Signed)
Subjective:    Patient ID: Kylie Hurst, female    DOB: 11-16-1945, 68 y.o.   MRN: [id number]  HPI: Ms. Kylie Hurst is a 68 year old female who returns for follow up for chronic pain and medication refill. She says her pain is located in her right leg, lower back and has numbness in both feet. She says her pain has increased and has become unbearable. There were times in the month she had to take three Tramadol in a day. She brought her notebook to show me.  We will increase her tablets.  She rates her pain 10. Her current exercise regime is walking and gardening.  Pain Inventory Average Pain 8 Pain Right Now 10 My pain is constant, sharp, dull, tingling and aching  In the last 24 hours, has pain interfered with the following? General activity 7 Relation with others 4 Enjoyment of life 8 What TIME of day is your pain at its worst? evening Sleep (in general) Fair  Pain is worse with: unsure Pain improves with: rest and medication Relief from Meds: 7  Mobility walk without assistance how many minutes can you walk? 30 ability to climb steps?  yes do you drive?  yes  Function retired  Neuro/Psych weakness numbness tingling spasms  Prior Studies Any changes since last visit?  no  Physicians involved in your care Any changes since last visit?  no   History reviewed. No pertinent family history. History   Social History  . Marital Status: Divorced    Spouse Name: N/A    Number of Children: N/A  . Years of Education: N/A   Social History Main Topics  . Smoking status: Current Every Day Smoker -- 0.50 packs/day for 47 years    Types: Cigarettes  . Smokeless tobacco: Never Used  . Alcohol Use: No  . Drug Use: No  . Sexual Activity: None   Other Topics Concern  . None   Social History Narrative  . None   Past Surgical History  Procedure Laterality Date  . Colonoscopy    . Anterior cervical decomp/discectomy fusion N/A 12/18/2012    Procedure:  ANTERIOR CERVICAL DECOMPRESSION/DISCECTOMY FUSION 1 LEVEL;  Surgeon: Sinclair Ship, MD;  Location: [facility];  Service: Orthopedics;  Laterality: N/A;  Anterior cervical decompression fusion, cervical 7 - thoracic 1 with instrumentation, allograft  . Neck surgery     Past Medical History  Diagnosis Date  . Hypertension   . Aortic aneurysm 2011    3.33mm  . Ovarian cyst 06/01/2009    5 cm  . Onycholysis of toenail   . Osteoporosis   . Chronic back pain   . Restless legs   . Back spasm   . Emphysema   . Bulging discs   . Depression   . Diabetes mellitus without complication     fasting 90-110  . Arthritis   . Neuromuscular disorder   . Hyperlipidemia    BP 105/69  Pulse 70  Resp 20  SpO2 97%  Opioid Risk Score:   Fall Risk Score: Moderate Fall Risk (6-13 points) (previoulsy educatd and given handout)  Review of Systems  Gastrointestinal: Positive for constipation.  Endocrine:       High blood sugars  Musculoskeletal:       Spasms  Neurological: Positive for weakness and numbness.       Tingling  All other systems reviewed and are negative.      Objective:   Physical Exam  Nursing  note and vitals reviewed. Constitutional: She is oriented to person, place, and time. She appears well-developed and well-nourished.  HENT:  Head: Normocephalic and atraumatic.  Neck: Normal range of motion. Neck supple.  Cardiovascular: Normal rate and regular rhythm.   Pulmonary/Chest: Effort normal and breath sounds normal.  Musculoskeletal:  Normal Muscle Bulk and Muscle Testing Reveals: Upper Extremities: Full ROM and Muscle strength 5/5 Back without spinal or paraspinal tenderness Lower Extremities: Full ROM and Muscle Strength 5/5 Right leg Flexion Produces Pain into her right leg Arises from chair with ease Narrow based gait   Neurological: She is alert and oriented to person, place, and time.  Skin: Skin is warm and dry.  Psychiatric: She has a normal mood and affect.            Assessment & Plan:  1. 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 Radiculopathies at L5-S1. Refilled: Tramadol 50 mg one tablet  twice a day. May take an extra tablet when pain is severe no more than three a day. Tablets increased to 80. 2. Diabetic peripheral neuropathy causing bilateral foot numbness. Continue to Monitor.  20 minutes of face to face patient care time was spent during this visit. All questions were encouraged and answered.   F/U in 3 months

## 2013-11-16 ENCOUNTER — Emergency Department (HOSPITAL_COMMUNITY)
Admission: EM | Admit: 2013-11-16 | Discharge: 2013-11-16 | Disposition: A | Discharge status: Home / Self Care | Payer: PRIVATE HEALTH INSURANCE | Attending: Emergency Medicine | Admitting: Emergency Medicine

## 2013-11-16 DIAGNOSIS — M1990 Unspecified osteoarthritis, unspecified site: Secondary | ICD-10-CM | POA: Diagnosis not present

## 2013-11-16 DIAGNOSIS — I10 Essential (primary) hypertension: Secondary | ICD-10-CM | POA: Diagnosis not present

## 2013-11-16 DIAGNOSIS — Z79899 Other long term (current) drug therapy: Secondary | ICD-10-CM | POA: Insufficient documentation

## 2013-11-16 DIAGNOSIS — Z7952 Long term (current) use of systemic steroids: Secondary | ICD-10-CM | POA: Diagnosis not present

## 2013-11-16 DIAGNOSIS — F329 Major depressive disorder, single episode, unspecified: Secondary | ICD-10-CM | POA: Insufficient documentation

## 2013-11-16 DIAGNOSIS — R21 Rash and other nonspecific skin eruption: Secondary | ICD-10-CM | POA: Insufficient documentation

## 2013-11-16 DIAGNOSIS — G8929 Other chronic pain: Secondary | ICD-10-CM | POA: Diagnosis not present

## 2013-11-16 DIAGNOSIS — J439 Emphysema, unspecified: Secondary | ICD-10-CM | POA: Diagnosis not present

## 2013-11-16 DIAGNOSIS — Z720 Tobacco use: Secondary | ICD-10-CM | POA: Insufficient documentation

## 2013-11-16 DIAGNOSIS — E119 Type 2 diabetes mellitus without complications: Secondary | ICD-10-CM | POA: Diagnosis not present

## 2013-11-16 DIAGNOSIS — E785 Hyperlipidemia, unspecified: Secondary | ICD-10-CM | POA: Diagnosis not present

## 2013-11-16 DIAGNOSIS — Z8742 Personal history of other diseases of the female genital tract: Secondary | ICD-10-CM | POA: Insufficient documentation

## 2013-11-16 MED ORDER — DIPHENHYDRAMINE HCL 25 MG PO TABS: 25.0000 mg | ORAL_TABLET | Freq: Four times a day (QID) | ORAL | Status: DC

## 2013-11-16 MED ORDER — PREDNISONE 20 MG PO TABS: ORAL_TABLET | ORAL | Status: DC

## 2013-11-16 NOTE — ED Provider Notes (Signed)
CSN: [id number]     Arrival date & time 11/16/13  1814 History  This chart was scribed for a non-physician practitioner, Verl Dicker, PA-C working with Orpah Greek, MD by Martinique Peace, ED Scribe. The patient was seen in [facility]. The patient's care was started at 6:57 PM.    Chief Complaint  Patient presents with  . Rash      The history is provided by the patient. No language interpreter was used.   HPI Comments: Kylie Hurst is a 68 y.o. female who presents to the Emergency Department complaining of rash onset 2 days ago with associated pruritis, swelling, and pain to affected areas. Pt states that she slept on her friends couch Friday night without any blankets in some shorts and a t-shirt. She states that her friends dog also slept with her that night as well. She complains of "rash" specifically to left thigh, right leg, left arm, and buttocks. She describes rash as heat and burning and mildly pruritic.  Pt expresses concern that she may have been bitten by fleas from dog. Pt reports hx of controlled DM. She denies insulin usage or any known allergies. Pt further denies fever, SOB, or trouble intaking fluids.  Pt is current smoker.    Past Medical History  Diagnosis Date  . Hypertension   . Aortic aneurysm 2011    3.76mm  . Ovarian cyst 06/01/2009    5 cm  . Onycholysis of toenail   . Osteoporosis   . Chronic back pain   . Restless legs   . Back spasm   . Emphysema   . Bulging discs   . Depression   . Diabetes mellitus without complication     fasting 90-110  . Arthritis   . Neuromuscular disorder   . Hyperlipidemia    Past Surgical History  Procedure Laterality Date  . Colonoscopy    . Anterior cervical decomp/discectomy fusion N/A 12/18/2012    Procedure: ANTERIOR CERVICAL DECOMPRESSION/DISCECTOMY FUSION 1 LEVEL;  Surgeon: Sinclair Ship, MD;  Location: [facility];  Service: Orthopedics;  Laterality: N/A;  Anterior cervical decompression fusion,  cervical 7 - thoracic 1 with instrumentation, allograft  . Neck surgery     No family history on file. History  Substance Use Topics  . Smoking status: Current Every Day Smoker -- 0.50 packs/day for 47 years    Types: Cigarettes  . Smokeless tobacco: Never Used  . Alcohol Use: No   OB History   Grav Para Term Preterm Abortions TAB SAB Ect Mult Living                 Review of Systems  Constitutional: Negative for fever.  HENT: Negative for trouble swallowing.   Respiratory: Negative for shortness of breath.   Skin: Positive for color change (redness) and rash.  All other systems reviewed and are negative.     Allergies  Review of patient's allergies indicates no known allergies.  Home Medications   Prior to Admission medications   Medication Sig Start Date End Date Taking? Authorizing Provider  ACCU-CHEK AVIVA PLUS test strip  06/11/13   Historical Provider, MD  ACCU-CHEK FASTCLIX LANCETS [place]  06/11/13   Historical Provider, MD  alendronate (FOSAMAX) 70 MG tablet Take 70 mg by mouth every Saturday. Take with a full glass of water on an empty stomach.    Historical Provider, MD  Ascorbic Acid (VITAMIN C) 1000 MG tablet Take 1,000-2,000 mg by mouth See admin instructions. Patient takes  one tablet (500 mg) daily during Spring, Summer, and Fall. Patient takes one tablet (500 mg) twice daily in the Winter.    Historical Provider, MD  b complex vitamins tablet Take 1 tablet by mouth daily.    Historical Provider, MD  BIOTIN PO Take 500 mg by mouth 2 (two) times daily.    Historical Provider, MD  calcium-vitamin D (OSCAL WITH D) 500-200 MG-UNIT per tablet Take 1 tablet by mouth 2 (two) times daily. 600mg /400 units    Historical Provider, MD  cetirizine (ZYRTEC) 10 MG tablet Take 10 mg by mouth at bedtime.    Historical Provider, MD  cyclobenzaprine (FEXMID) 7.5 MG tablet Take 1 tablet (7.5 mg total) by mouth 3 (three) times daily as needed for muscle spasms. 06/18/13   Charlett Blake, MD  diphenhydrAMINE (BENADRYL) 25 MG tablet Take 1 tablet (25 mg total) by mouth every 6 (six) hours. 11/16/13   Viona Gilmore Naja Apperson, PA-C  hydrochlorothiazide (HYDRODIURIL) 25 MG tablet Take 12.5 mg by mouth daily.     Historical Provider, MD  lisinopril (PRINIVIL,ZESTRIL) 5 MG tablet Take 5 mg by mouth at bedtime.     Historical Provider, MD  metFORMIN (GLUCOPHAGE) 500 MG tablet Take 500 mg by mouth daily.    Historical Provider, MD  Multiple Vitamins-Minerals (MULTIVITAMIN WITH MINERALS) tablet Take 1 tablet by mouth daily.    Historical Provider, MD  Omega-3 Fatty Acids (FISH OIL) 1000 MG CAPS Take by mouth. Fish oil 1000mg /300mg  Omega 3    Historical Provider, MD  pravastatin (PRAVACHOL) 20 MG tablet Take 20 mg by mouth at bedtime.     Historical Provider, MD  predniSONE (DELTASONE) 20 MG tablet 3 tabs po day one, then 2 tabs daily x 4 days 11/16/13   Viona Gilmore Nevan Creighton, PA-C  pyridOXINE (VITAMIN B-6) 100 MG tablet Take 100 mg by mouth at bedtime.     Historical Provider, MD  tiZANidine (ZANAFLEX) 2 MG tablet Take 1 tablet (2 mg total) by mouth 2 (two) times daily. 08/28/13   Charlett Blake, MD  traMADol (ULTRAM) 50 MG tablet Take 1 tablet (50 mg total) by mouth every 12 (twelve) hours as needed. 10/03/13   Danella Sensing, NP   BP 147/73  Pulse 72  Temp(Src) 98.5 F (36.9 C) (Oral)  Resp 18  SpO2 96% Physical Exam  Nursing note and vitals reviewed. Constitutional: She is oriented to person, place, and time. She appears well-developed and well-nourished. No distress.  HENT:  Head: Normocephalic and atraumatic.  Eyes: Conjunctivae and EOM are normal.  Neck: Neck supple. No tracheal deviation present.  Cardiovascular: Normal rate.   Pulmonary/Chest: Effort normal. No respiratory distress.  Musculoskeletal: Normal range of motion.  Neurological: She is alert and oriented to person, place, and time.  Skin: Skin is warm and dry.  Psychiatric: She has a normal mood and affect.  Her behavior is normal.      ED Course  Procedures (including critical care time) Labs Review Labs Reviewed - No data to display  Results for orders placed during the hospital encounter of 12/18/12  GLUCOSE, CAPILLARY      Result Value Ref Range   Glucose-Capillary 117 (*) 70 - 99 mg/dL  GLUCOSE, CAPILLARY      Result Value Ref Range   Glucose-Capillary 107 (*) 70 - 99 mg/dL  TYPE AND SCREEN      Result Value Ref Range   ABO/RH(D) A POS     Antibody Screen NEG  Sample Expiration 12/21/2012    ABO/RH      Result Value Ref Range   ABO/RH(D) A POS     No results found.    Imaging Review No results found.   EKG Interpretation None      Medications - No data to display  7:03 PM- Treatment plan was discussed with patient who verbalizes understanding and agrees.   MDM  Vitals stable - WNL -afebrile Pt resting comfortably in ED. No evidence of anaphylaxis or respiratory compromise. PE consistent with insect bite with associated local erythematous reaction. No sign of active infection or systemic complication.  Will DC with steroid taper and benadryl for symptom relief. May use Hydrocortisone cream as needed. Discussed f/u with PCP and return precautions, pt very amenable to plan. Pt stable, in good condition and is appropriate for DC.  Final diagnoses:  Rash and nonspecific skin eruption      I personally performed the services described in this documentation, which was scribed in my presence. The recorded information has been reviewed and is accurate.   Verl Dicker, PA-C 11/17/13 [phone]

## 2013-11-16 NOTE — ED Notes (Signed)
Pt reports she thinks she has flea bites. Slept on a friends couch on Friday night, friend has a dog, reports on Saturday noticed rash on various parts of body. Pt has bright red spots on legs, arms, buttocks. Pain 7/10.

## 2013-11-19 NOTE — ED Provider Notes (Signed)
Medical screening examination/treatment/procedure(s) were performed by non-physician practitioner and as supervising physician I was immediately available for consultation/collaboration.   EKG Interpretation None        Orpah Greek, MD 11/19/13 [phone]

## 2013-12-19 ENCOUNTER — Ambulatory Visit: Payer: PRIVATE HEALTH INSURANCE | Admitting: Physical Medicine & Rehabilitation

## 2014-01-02 ENCOUNTER — Ambulatory Visit: Payer: PRIVATE HEALTH INSURANCE | Admitting: Physical Medicine & Rehabilitation

## 2014-01-09 ENCOUNTER — Encounter: Payer: PRIVATE HEALTH INSURANCE | Attending: Physical Medicine and Rehabilitation

## 2014-01-09 ENCOUNTER — Encounter: Payer: Self-pay | Admitting: Physical Medicine & Rehabilitation

## 2014-01-09 ENCOUNTER — Ambulatory Visit (HOSPITAL_BASED_OUTPATIENT_CLINIC_OR_DEPARTMENT_OTHER): Payer: PRIVATE HEALTH INSURANCE | Admitting: Physical Medicine & Rehabilitation

## 2014-01-09 VITALS — BP 109/70 | HR 68 | Resp 14 | Ht <= 58 in | Wt 109.0 lb

## 2014-01-09 DIAGNOSIS — E785 Hyperlipidemia, unspecified: Secondary | ICD-10-CM | POA: Insufficient documentation

## 2014-01-09 DIAGNOSIS — Z981 Arthrodesis status: Secondary | ICD-10-CM | POA: Insufficient documentation

## 2014-01-09 DIAGNOSIS — E119 Type 2 diabetes mellitus without complications: Secondary | ICD-10-CM | POA: Diagnosis not present

## 2014-01-09 DIAGNOSIS — G8929 Other chronic pain: Secondary | ICD-10-CM | POA: Diagnosis present

## 2014-01-09 DIAGNOSIS — M5416 Radiculopathy, lumbar region: Secondary | ICD-10-CM

## 2014-01-09 DIAGNOSIS — M545 Low back pain: Secondary | ICD-10-CM | POA: Insufficient documentation

## 2014-01-09 DIAGNOSIS — I10 Essential (primary) hypertension: Secondary | ICD-10-CM | POA: Diagnosis not present

## 2014-01-09 DIAGNOSIS — M810 Age-related osteoporosis without current pathological fracture: Secondary | ICD-10-CM | POA: Diagnosis not present

## 2014-01-09 DIAGNOSIS — M4317 Spondylolisthesis, lumbosacral region: Secondary | ICD-10-CM

## 2014-01-09 NOTE — Progress Notes (Signed)
Subjective:    Patient ID: Kylie Hurst, female    DOB: [birth year], 68 y.o.   MRN: [id number] The patient is a 68 year old female, history of L5-S1 spondylolisthesis with chronic low back pain.  Also history of cervical radiculopathy and underwent C7-T1 ACDF in November of 2014.  Last PA visit was prior to surgery  Interval history obtained from patient.  Off of narcotic analgesics such as hydrocodone and oxycodone postop  Has been released by orthopedic surgeon.  Currently taking tramadol 1 tablet twice a day working fairly well  HPI Does flexion exercise for lumbar spine Independent with all mobility and ADLs Busy with house and garden during the day Pain Inventory Average Pain 7 Pain Right Now 6 My pain is constant, dull and aching  In the last 24 hours, has pain interfered with the following? General activity 6 Relation with others 5 Enjoyment of life 5 What TIME of day is your pain at its worst? night Sleep (in general) Fair  Pain is worse with: sitting Pain improves with: rest and medication Relief from Meds: 4  Mobility walk without assistance how many minutes can you walk? 30 ability to climb steps?  yes do you drive?  yes  Function retired  Neuro/Psych weakness numbness tingling spasms  Prior Studies Any changes since last visit?  no  Physicians involved in your care Any changes since last visit?  no   History reviewed. No pertinent family history. History   Social History  . Marital Status: Divorced    Spouse Name: N/A    Number of Children: N/A  . Years of Education: N/A   Social History Main Topics  . Smoking status: Current Every Day Smoker -- 0.50 packs/day for 47 years    Types: Cigarettes  . Smokeless tobacco: Never Used  . Alcohol Use: No  . Drug Use: No  . Sexual Activity: None   Other Topics Concern  . None   Social History Narrative   Past Surgical History  Procedure Laterality Date  . Colonoscopy    . Anterior  cervical decomp/discectomy fusion N/A 12/18/2012    Procedure: ANTERIOR CERVICAL DECOMPRESSION/DISCECTOMY FUSION 1 LEVEL;  Surgeon: Sinclair Ship, MD;  Location: [facility];  Service: Orthopedics;  Laterality: N/A;  Anterior cervical decompression fusion, cervical 7 - thoracic 1 with instrumentation, allograft  . Neck surgery     Past Medical History  Diagnosis Date  . Hypertension   . Aortic aneurysm 2011    3.51mm  . Ovarian cyst 06/01/2009    5 cm  . Onycholysis of toenail   . Osteoporosis   . Chronic back pain   . Restless legs   . Back spasm   . Emphysema   . Bulging discs   . Depression   . Diabetes mellitus without complication     fasting 90-110  . Arthritis   . Neuromuscular disorder   . Hyperlipidemia    BP 109/70 mmHg  Pulse 68  Resp 14  Ht 4\' 9"  (1.448 m)  Wt 109 lb (49.442 kg)  BMI 23.58 kg/m2  SpO2 96%  Opioid Risk Score:   Fall Risk Score: Low Fall Risk (0-5 points)  Review of Systems  HENT: Negative.   Eyes: Negative.   Cardiovascular: Negative.   Gastrointestinal: Negative.   Endocrine: Negative.   Genitourinary: Negative.   Musculoskeletal: Positive for back pain.  Skin: Negative.   Allergic/Immunologic: Negative.   Neurological: Positive for weakness and numbness.  Tingling, spasms  Hematological: Negative.   Psychiatric/Behavioral: Negative.        Objective:   Physical Exam  Constitutional: She is oriented to person, place, and time. She appears well-developed and well-nourished.  Neurological: She is alert and oriented to person, place, and time.  Psychiatric: She has a normal mood and affect.  Nursing note and vitals reviewed.  Full ROM in lumbar spine In tenderness in the gluteus maximus area, piriformis area bilateral  Feature no evidence of lesion is dry feet. Onychomycosis Decreased sensation to light touch in the right lower extremity compared to left side.     Assessment & Plan:  1. Chronic lumbar radiculopathy in  a patient with spondylolisthesis L5-S1. Radiculopathies at L5-S1 on Right side Continue tramadol 50 mg twice a day sometimes 3 times a day, has good relief and cyclobenzaprine recommend 5 mg each bedtime.  Follow up in 34months    2. Diabetic peripheral neuropathy causing bilateral foot numbness. This is diagnosed on EMG testing  Considered gabapentin if the peripheral neuropathy becomes more painful   Recommend podiatrist for toenail trimming and treatment of toenail fungus  3. Myofascial pain gluteus area,Patient instructed in hip stretching and strengthening exercises. Nurse practitioner visit in 6 months

## 2014-01-09 NOTE — Patient Instructions (Signed)
Hip Exercises RANGE OF MOTION (ROM) AND STRETCHING EXERCISES  These exercises may help you when beginning to rehabilitate your injury. Doing them too aggressively can worsen your condition. Complete them slowly and gently. Your symptoms may resolve with or without further involvement from your physician, physical therapist or athletic trainer. While completing these exercises, remember:   Restoring tissue flexibility helps normal motion to return to the joints. This allows healthier, less painful movement and activity.  An effective stretch should be held for at least 30 seconds.  A stretch should never be painful. You should only feel a gentle lengthening or release in the stretched tissue. If these stretches worsen your symptoms even when done gently, consult your physician, physical therapist or athletic trainer. STRETCH - Hamstrings, Supine   Lie on your back. Loop a belt or towel over the ball of your right / left foot.  Straighten your right / left knee and slowly pull on the belt to raise your leg. Do not allow the right / left knee to bend. Keep your opposite leg flat on the floor.  Raise the leg until you feel a gentle stretch behind your right / left knee or thigh. Hold this position for __________ seconds. Repeat __________ times. Complete this stretch __________ times per day.  STRETCH - Hip Rotators   Lie on your back on a firm surface. Grasp your right / left knee with your right / left hand and your ankle with your opposite hand.  Keeping your hips and shoulders firmly planted, gently pull your right / left knee and rotate your lower leg toward your opposite shoulder until you feel a stretch in your buttocks.  Hold this stretch for __________ seconds. Repeat this stretch __________ times. Complete this stretch __________ times per day. STRETCH - Hamstrings/Adductors, V-Sit   Sit on the floor with your legs extended in a large "V," keeping your knees straight.  With your  head and chest upright, bend at your waist reaching for your right foot to stretch your left adductors.  You should feel a stretch in your left inner thigh. Hold for __________ seconds.  Return to the upright position to relax your leg muscles.  Continuing to keep your chest upright, bend straight forward at your waist to stretch your hamstrings.  You should feel a stretch behind both of your thighs and/or knees. Hold for __________ seconds.  Return to the upright position to relax your leg muscles.  Repeat steps 2 through 4 for opposite leg. Repeat __________ times. Complete this exercise __________ times per day.  STRETCHING - Hip Flexors, Lunge  Half kneel with your right / left knee on the floor and your opposite knee bent and directly over your ankle.  Keep good posture with your head over your shoulders. Tighten your buttocks to point your tailbone downward; this will prevent your back from arching too much.  You should feel a gentle stretch in the front of your thigh and/or hip. If you do not feel any resistance, slightly slide your opposite foot forward and then slowly lunge forward so your knee once again lines up over your ankle. Be sure your tailbone remains pointed downward.  Hold this stretch for __________ seconds. Repeat __________ times. Complete this stretch __________ times per day. STRENGTHENING EXERCISES These exercises may help you when beginning to rehabilitate your injury. They may resolve your symptoms with or without further involvement from your physician, physical therapist or athletic trainer. While completing these exercises, remember:   Muscles  can gain both the endurance and the strength needed for everyday activities through controlled exercises.  Complete these exercises as instructed by your physician, physical therapist or athletic trainer. Progress the resistance and repetitions only as guided.  You may experience muscle soreness or fatigue, but the  pain or discomfort you are trying to eliminate should never worsen during these exercises. If this pain does worsen, stop and make certain you are following the directions exactly. If the pain is still present after adjustments, discontinue the exercise until you can discuss the trouble with your clinician. STRENGTH - Hip Extensors, Bridge   Lie on your back on a firm surface. Bend your knees and place your feet flat on the floor.  Tighten your buttocks muscles and lift your bottom off the floor until your trunk is level with your thighs. You should feel the muscles in your buttocks and back of your thighs working. If you do not feel these muscles, slide your feet 1-2 inches further away from your buttocks.  Hold this position for __________ seconds.  Slowly lower your hips to the starting position and allow your buttock muscles relax completely before beginning the next repetition.  If this exercise is too easy, you may cross your arms over your chest. Repeat __________ times. Complete this exercise __________ times per day.  STRENGTH - Hip Abductors, Straight Leg Raises  Be aware of your form throughout the entire exercise so that you exercise the correct muscles. Sloppy form means that you are not strengthening the correct muscles.  Lie on your side so that your head, shoulders, knee and hip line up. You may bend your lower knee to help maintain your balance. Your right / left leg should be on top.  Roll your hips slightly forward, so that your hips are stacked directly over each other and your right / left knee is facing forward.  Lift your top leg up 4-6 inches, leading with your heel. Be sure that your foot does not drift forward or that your knee does not roll toward the ceiling.  Hold this position for __________ seconds. You should feel the muscles in your outer hip lifting (you may not notice this until your leg begins to tire).  Slowly lower your leg to the starting position. Allow  the muscles to fully relax before beginning the next repetition. Repeat __________ times. Complete this exercise __________ times per day.  STRENGTH - Hip Adductors, Straight Leg Raises   Lie on your side so that your head, shoulders, knee and hip line up. You may place your upper foot in front to help maintain your balance. Your right / left leg should be on the bottom.  Roll your hips slightly forward, so that your hips are stacked directly over each other and your right / left knee is facing forward.  Tense the muscles in your inner thigh and lift your bottom leg 4-6 inches. Hold this position for __________ seconds.  Slowly lower your leg to the starting position. Allow the muscles to fully relax before beginning the next repetition. Repeat __________ times. Complete this exercise __________ times per day.  STRENGTH - Quadriceps, Straight Leg Raises  Quality counts! Watch for signs that the quadriceps muscle is working to insure you are strengthening the correct muscles and not "cheating" by substituting with healthier muscles.  Lay on your back with your right / left leg extended and your opposite knee bent.  Tense the muscles in the front of your right / left  thigh. You should see either your knee cap slide up or increased dimpling just above the knee. Your thigh may even quiver.  Tighten these muscles even more and raise your leg 4 to 6 inches off the floor. Hold for right / left seconds.  Keeping these muscles tense, lower your leg.  Relax the muscles slowly and completely in between each repetition. Repeat __________ times. Complete this exercise __________ times per day.  STRENGTH - Hip Abductors, Standing  Tie one end of a rubber exercise band/tubing to a secure surface (table, pole) and tie a loop at the other end.  Place the loop around your right / left ankle. Keeping your ankle with the band directly opposite of the secured end, step away until there is tension in the  tube/band.  Hold onto a chair as needed for balance.  Keeping your back upright, your shoulders over your hips, and your toes pointing forward, lift your right / left leg out to your side. Be sure to lift your leg with your hip muscles. Do not "throw" your leg or tip your body to lift your leg.  Slowly and with control, return to the starting position. Repeat exercise __________ times. Complete this exercise __________ times per day.  STRENGTH - Quadriceps, Squats  Stand in a door frame so that your feet and knees are in line with the frame.  Use your hands for balance, not support, on the frame.  Slowly lower your weight, bending at the hips and knees. Keep your lower legs upright so that they are parallel with the door frame. Squat only within the range that does not increase your knee pain. Never let your hips drop below your knees.  Slowly return upright, pushing with your legs, not pulling with your hands. Document Released: 02/03/2005 Document Revised: 04/10/2011 Document Reviewed: 04/30/2008 [facility] Patient Information 2015 [place], [place]. This information is not intended to replace advice given to you by your health care provider. Make sure you discuss any questions you have with your health care provider.

## 2014-02-12 ENCOUNTER — Telehealth: Payer: Self-pay | Admitting: *Deleted

## 2014-02-12 NOTE — Telephone Encounter (Signed)
Pt asking for a call back about her Rx for tizanidine. Her refills say it is from Dr. Jimmye Norman and pt is pretty sure Dr. Letta Pate is the one who wrote the Rx. Please call and clarify

## 2014-03-15 ENCOUNTER — Encounter (HOSPITAL_COMMUNITY): Payer: Self-pay | Admitting: *Deleted

## 2014-03-15 ENCOUNTER — Emergency Department (HOSPITAL_COMMUNITY)
Admission: EM | Admit: 2014-03-15 | Discharge: 2014-03-15 | Disposition: A | Discharge status: Home / Self Care | Payer: Medicare Other | Attending: Emergency Medicine | Admitting: Emergency Medicine

## 2014-03-15 DIAGNOSIS — L299 Pruritus, unspecified: Secondary | ICD-10-CM | POA: Diagnosis not present

## 2014-03-15 DIAGNOSIS — G8929 Other chronic pain: Secondary | ICD-10-CM | POA: Insufficient documentation

## 2014-03-15 DIAGNOSIS — M810 Age-related osteoporosis without current pathological fracture: Secondary | ICD-10-CM | POA: Insufficient documentation

## 2014-03-15 DIAGNOSIS — I10 Essential (primary) hypertension: Secondary | ICD-10-CM | POA: Insufficient documentation

## 2014-03-15 DIAGNOSIS — M1990 Unspecified osteoarthritis, unspecified site: Secondary | ICD-10-CM | POA: Diagnosis not present

## 2014-03-15 DIAGNOSIS — L282 Other prurigo: Secondary | ICD-10-CM

## 2014-03-15 DIAGNOSIS — L509 Urticaria, unspecified: Secondary | ICD-10-CM | POA: Insufficient documentation

## 2014-03-15 DIAGNOSIS — Z8619 Personal history of other infectious and parasitic diseases: Secondary | ICD-10-CM | POA: Diagnosis not present

## 2014-03-15 DIAGNOSIS — R21 Rash and other nonspecific skin eruption: Secondary | ICD-10-CM | POA: Diagnosis present

## 2014-03-15 DIAGNOSIS — J439 Emphysema, unspecified: Secondary | ICD-10-CM | POA: Diagnosis not present

## 2014-03-15 DIAGNOSIS — Z720 Tobacco use: Secondary | ICD-10-CM | POA: Diagnosis not present

## 2014-03-15 DIAGNOSIS — Z79899 Other long term (current) drug therapy: Secondary | ICD-10-CM | POA: Diagnosis not present

## 2014-03-15 DIAGNOSIS — E785 Hyperlipidemia, unspecified: Secondary | ICD-10-CM | POA: Diagnosis not present

## 2014-03-15 DIAGNOSIS — E119 Type 2 diabetes mellitus without complications: Secondary | ICD-10-CM | POA: Insufficient documentation

## 2014-03-15 MED ORDER — HYDROXYZINE HCL 25 MG PO TABS
25.0000 mg | ORAL_TABLET | Freq: Once | ORAL | Status: AC
  Administered 2014-03-15: 25 mg via ORAL
  Filled 2014-03-15: qty 1

## 2014-03-15 MED ORDER — PREDNISONE 20 MG PO TABS
60.0000 mg | ORAL_TABLET | Freq: Once | ORAL | Status: AC
  Administered 2014-03-15: 60 mg via ORAL
  Filled 2014-03-15: qty 3

## 2014-03-15 MED ORDER — FAMOTIDINE 20 MG PO TABS
20.0000 mg | ORAL_TABLET | Freq: Once | ORAL | Status: AC
  Administered 2014-03-15: 20 mg via ORAL
  Filled 2014-03-15: qty 1

## 2014-03-15 MED ORDER — PREDNISONE 20 MG PO TABS: ORAL_TABLET | ORAL | Status: DC

## 2014-03-15 MED ORDER — HYDROXYZINE HCL 10 MG PO TABS: 10.0000 mg | ORAL_TABLET | Freq: Three times a day (TID) | ORAL | Status: DC | PRN

## 2014-03-15 NOTE — Discharge Instructions (Signed)
Rash A rash is a change in the color or feel of your skin. There are many different types of rashes. You may have other problems along with your rash. HOME CARE  Avoid the thing that caused your rash.  Do not scratch your rash.  You may take cools baths to help stop itching.  Only take medicines as told by your doctor.  Keep all doctor visits as told. GET HELP RIGHT AWAY IF:   Your pain, puffiness (swelling), or redness gets worse.  You have a fever.  You have new or severe problems.  You have body aches, watery poop (diarrhea), or you throw up (vomit).  Your rash is not better after 3 days. MAKE SURE YOU:   Understand these instructions.  Will watch your condition.  Will get help right away if you are not doing well or get worse. Document Released: 07/05/2007 Document Revised: 04/10/2011 Document Reviewed: 10/31/2010 [facility] Patient Information 2015 [place], [place]. This information is not intended to replace advice given to you by your health care provider. Make sure you discuss any questions you have with your health care provider.  Hives Hives are itchy, red, swollen areas of the skin. They can vary in size and location on your body. Hives can come and go for hours or several days (acute hives) or for several weeks (chronic hives). Hives do not spread from person to person (noncontagious). They may get worse with scratching, exercise, and emotional stress. CAUSES   Allergic reaction to food, additives, or drugs.  Infections, including the common cold.  Illness, such as vasculitis, lupus, or thyroid disease.  Exposure to sunlight, heat, or cold.  Exercise.  Stress.  Contact with chemicals. SYMPTOMS   Red or white swollen patches on the skin. The patches may change size, shape, and location quickly and repeatedly.  Itching.  Swelling of the hands, feet, and face. This may occur if hives develop deeper in the skin. DIAGNOSIS  Your caregiver can usually  tell what is wrong by performing a physical exam. Skin or blood tests may also be done to determine the cause of your hives. In some cases, the cause cannot be determined. TREATMENT  Mild cases usually get better with medicines such as antihistamines. Severe cases may require an emergency epinephrine injection. If the cause of your hives is known, treatment includes avoiding that trigger.  HOME CARE INSTRUCTIONS   Avoid causes that trigger your hives.  Take antihistamines as directed by your caregiver to reduce the severity of your hives. Non-sedating or low-sedating antihistamines are usually recommended. Do not drive while taking an antihistamine.  Take any other medicines prescribed for itching as directed by your caregiver.  Wear loose-fitting clothing.  Keep all follow-up appointments as directed by your caregiver. SEEK MEDICAL CARE IF:   You have persistent or severe itching that is not relieved with medicine.  You have painful or swollen joints. SEEK IMMEDIATE MEDICAL CARE IF:   You have a fever.  Your tongue or lips are swollen.  You have trouble breathing or swallowing.  You feel tightness in the throat or chest.  You have abdominal pain. These problems may be the first sign of a life-threatening allergic reaction. Call your local emergency services (911 in [place] MAKE SURE YOU:   Understand these instructions.  Will watch your condition.  Will get help right away if you are not doing well or get worse. Document Released: 01/16/2005 Document Revised: 01/21/2013 Document Reviewed: 04/11/2011 [facility] Patient Information 2015 [place], [place].  This information is not intended to replace advice given to you by your health care provider. Make sure you discuss any questions you have with your health care provider.

## 2014-03-15 NOTE — ED Notes (Signed)
Per pt, states rash on legs and hands since yesterday-spreading

## 2014-03-15 NOTE — ED Provider Notes (Signed)
CSN: [id number]     Arrival date & time 03/15/14  0757 History   First MD Initiated Contact with Patient 03/15/14 0803     No chief complaint on file.    (Consider location/radiation/quality/duration/timing/severity/associated sxs/prior Treatment) HPI   69 year old female with history of diabetes, hyperlipidemia, hypertension presents for evaluation of an itchy rash. Patient reports she was at a friend's house to help clean his house yesterday. She mentioned that the house is a lot of dogs and carpet.  After cleaning his house she developed a pruritic rash first to her R forearm but has since spread throughout her body.  She has been scratching at the rash and rubbing at it very she noticed that her rashes and causing her arms as well. She tries using permethrin cream that she had in the past, hydrocortisone cream, Benadryl, which provides minimal improvement. There is no associated fever, headache, chest pain, trouble breathing, abdominal cramping, nausea vomiting or diarrhea, or throat swelling. She denies any other environmental changes, soap or body detergent, new pets, or medication changes. She did mention that the person she was cleaning the house has been complaining of itching.  Past Medical History  Diagnosis Date  . Hypertension   . Aortic aneurysm 2011    3.72mm  . Ovarian cyst 06/01/2009    5 cm  . Onycholysis of toenail   . Osteoporosis   . Chronic back pain   . Restless legs   . Back spasm   . Emphysema   . Bulging discs   . Depression   . Diabetes mellitus without complication     fasting 90-110  . Arthritis   . Neuromuscular disorder   . Hyperlipidemia    Past Surgical History  Procedure Laterality Date  . Colonoscopy    . Anterior cervical decomp/discectomy fusion N/A 12/18/2012    Procedure: ANTERIOR CERVICAL DECOMPRESSION/DISCECTOMY FUSION 1 LEVEL;  Surgeon: Sinclair Ship, MD;  Location: [facility];  Service: Orthopedics;  Laterality: N/A;  Anterior cervical  decompression fusion, cervical 7 - thoracic 1 with instrumentation, allograft  . Neck surgery     No family history on file. History  Substance Use Topics  . Smoking status: Current Every Day Smoker -- 0.50 packs/day for 47 years    Types: Cigarettes  . Smokeless tobacco: Never Used  . Alcohol Use: No   OB History    No data available     Review of Systems  All other systems reviewed and are negative.     Allergies  Review of patient's allergies indicates no known allergies.  Home Medications   Prior to Admission medications   Medication Sig Start Date End Date Taking? Authorizing Provider  ACCU-CHEK AVIVA PLUS test strip  06/11/13   Historical Provider, MD  ACCU-CHEK FASTCLIX LANCETS [place]  06/11/13   Historical Provider, MD  alendronate (FOSAMAX) 70 MG tablet Take 70 mg by mouth every Saturday. Take with a full glass of water on an empty stomach.    Historical Provider, MD  Ascorbic Acid (VITAMIN C) 1000 MG tablet Take 1,000-2,000 mg by mouth See admin instructions. Patient takes one tablet (500 mg) daily during Spring, Summer, and Fall. Patient takes one tablet (500 mg) twice daily in the Winter.    Historical Provider, MD  b complex vitamins tablet Take 1 tablet by mouth daily.    Historical Provider, MD  BIOTIN PO Take 500 mg by mouth 2 (two) times daily.    Historical Provider, MD  calcium-vitamin D Darron Doom  WITH D) 500-200 MG-UNIT per tablet Take 1 tablet by mouth 2 (two) times daily. 600mg /400 units    Historical Provider, MD  cetirizine (ZYRTEC) 10 MG tablet Take 10 mg by mouth at bedtime.    Historical Provider, MD  cyclobenzaprine (FEXMID) 7.5 MG tablet Take 1 tablet (7.5 mg total) by mouth 3 (three) times daily as needed for muscle spasms. 06/18/13   Charlett Blake, MD  diphenhydrAMINE (BENADRYL) 25 MG tablet Take 1 tablet (25 mg total) by mouth every 6 (six) hours. 11/16/13   Viona Gilmore Cartner, PA-C  hydrochlorothiazide (HYDRODIURIL) 25 MG tablet Take 12.5 mg by  mouth daily.     Historical Provider, MD  lisinopril (PRINIVIL,ZESTRIL) 5 MG tablet Take 5 mg by mouth at bedtime.     Historical Provider, MD  metFORMIN (GLUCOPHAGE) 500 MG tablet Take 500 mg by mouth daily.    Historical Provider, MD  Multiple Vitamins-Minerals (MULTIVITAMIN WITH MINERALS) tablet Take 1 tablet by mouth daily.    Historical Provider, MD  Omega-3 Fatty Acids (FISH OIL) 1000 MG CAPS Take by mouth. Fish oil 1000mg /300mg  Omega 3    Historical Provider, MD  pravastatin (PRAVACHOL) 20 MG tablet Take 20 mg by mouth at bedtime.     Historical Provider, MD  predniSONE (DELTASONE) 20 MG tablet 3 tabs po day one, then 2 tabs daily x 4 days 11/16/13   Viona Gilmore Cartner, PA-C  pyridOXINE (VITAMIN B-6) 100 MG tablet Take 100 mg by mouth at bedtime.     Historical Provider, MD  tiZANidine (ZANAFLEX) 2 MG tablet Take 1 tablet (2 mg total) by mouth 2 (two) times daily. 08/28/13   Charlett Blake, MD  traMADol (ULTRAM) 50 MG tablet Take 1 tablet (50 mg total) by mouth every 12 (twelve) hours as needed. 10/03/13   Danella Sensing, NP   There were no vitals taken for this visit. Physical Exam  Constitutional: She appears well-developed and well-nourished. No distress.  HENT:  Head: Atraumatic.  Mouth/Throat: Oropharynx is clear and moist.  Eyes: Conjunctivae are normal.  Neck: Neck supple.  Cardiovascular: Normal rate and regular rhythm.   Pulmonary/Chest: Effort normal and breath sounds normal. She has no wheezes.  Abdominal: Soft. There is no tenderness.  Neurological: She is alert.  Skin: Rash (Pruritic hives noted to neck, right forearm, lower back, left arm, bilateral lower legs without any signs of infection.  R forearm is edematous with localized skin irritation, no cellulitis) noted.  Psychiatric: She has a normal mood and affect.  Nursing note and vitals reviewed.   ED Course  Procedures (including critical care time)  Patient presents with pruritic hives likely from allergic  reaction, possibly from flea bites. No evidence suggest anaphylactic reaction. Patient will be given Vistaril and prednisone as treatment. She is made aware that prednisone can affect her blood sugar as she is a diabetic. No red flags.  Care discussed with Dr. Wyvonnia Dusky  Labs Review Labs Reviewed - No data to display  Imaging Review No results found.   EKG Interpretation None      MDM   Final diagnoses:  Pruritic rash    BP 133/83 mmHg  Pulse 70  Temp(Src) 98.6 F (37 C) (Oral)  Resp 18  SpO2 96%     Domenic Moras, PA-C 03/15/14 [place], MD 03/15/14 1715

## 2014-03-15 NOTE — ED Notes (Signed)
Pt ambulated to restroom with steady gait.

## 2014-03-15 NOTE — ED Notes (Signed)
Ice pack provided

## 2014-03-18 ENCOUNTER — Other Ambulatory Visit (HOSPITAL_COMMUNITY): Payer: Self-pay | Admitting: Specialist

## 2014-03-18 DIAGNOSIS — Z1231 Encounter for screening mammogram for malignant neoplasm of breast: Secondary | ICD-10-CM

## 2014-03-27 ENCOUNTER — Ambulatory Visit (HOSPITAL_COMMUNITY): Payer: Medicare Other

## 2014-04-02 ENCOUNTER — Ambulatory Visit (INDEPENDENT_AMBULATORY_CARE_PROVIDER_SITE_OTHER): Payer: Self-pay | Admitting: Surgery

## 2014-04-02 ENCOUNTER — Other Ambulatory Visit: Payer: Self-pay

## 2014-04-02 ENCOUNTER — Other Ambulatory Visit: Payer: Self-pay | Admitting: Specialist

## 2014-04-02 DIAGNOSIS — Z1231 Encounter for screening mammogram for malignant neoplasm of breast: Secondary | ICD-10-CM

## 2014-04-02 DIAGNOSIS — M8580 Other specified disorders of bone density and structure, unspecified site: Secondary | ICD-10-CM

## 2014-04-02 NOTE — H&P (Signed)
History of Present Illness Kylie Hurst. Kylie Mule MD; 04/02/2014 5:14 PM) Patient words: discuss hernia surgery.  The patient is a 69 year old female who presents with an inguinal hernia. Referred by Dr. York Hurst for ventral hernia.  This is a 69 year old female with a long history of smoking who presents for follow-up of a left lower quadrant ventral hernia. This has been present for about 3 years but seems to be getting larger. It causes some significant discomfort. The discomfort feels better when she is supine. She denies any nausea or vomiting. She continues to move her bowels daily. She continues to smoke despite repeated discussions on the importance of smoking cessation. She did well with her cervical spine surgery in 2014. She presents today to discuss possible hernia repair.     CT scan 2014  RADIOLOGY REPORT*  Clinical Data: Pelvic pain. Palpable abnormality in left pelvis. Right ovarian cyst.  CT PELVIS WITH CONTRAST  Technique: Multidetector CT imaging of the pelvis was performed using the standard protocol following the bolus administration of intravenous contrast.  Contrast: 17mL OMNIPAQUE IOHEXOL 300 MG/ML SOLN  Comparison: CT on 03/26/2009  Findings: A left lower quadrant ventral hernia is seen containing a loop of small bowel, which is new since previous study. This corresponds with the palpable abnormality. There is no evidence of small bowel obstruction or ischemia.  A benign appearing cyst is again seen in the right adnexa which measures 2.4 x 3.3 cm, and is not significantly changed in size or appearance since prior CT. This is consistent with a benign etiology. Uterus and left adnexa are unremarkable. No other pelvic masses or lymphadenopathy identified. No evidence of ascites or inflammatory process.  An infrarenal abdominal aortic aneurysm is seen which measures 4.1 x 3.9 cm, and also shows no significant change compared to  prior study.  IMPRESSION:  1. Left lower quadrant ventral hernia containing small bowel. This corresponds to the palpable abnormality. There is no evidence of small bowel obstruction or ischemia. 2. Stable 3 cm benign appearing right adnexal cyst. 3. Stable 4.1 cm infrarenal abdominal aortic aneurysm. Recommend continued followup with [place] in 1 year. This recommendation follows ACR consensus guidelines: White Paper of the ACR Incidental Findings Committee II on Vascular Findings. J Am Coll Radiol 2013; 10:789-794.   Original Report Authenticated By: Earle Gell, M.D. Other Problems Kylie Hurst, CMA; 04/02/2014 3:07 PM) Back Pain Chronic Obstructive Lung Disease Diabetes Mellitus High blood pressure Inguinal Hernia  Past Surgical History Kylie Hurst, [place]; 04/02/2014 3:07 PM) Colon Polyp Removal - Colonoscopy Spinal Surgery - Neck  Diagnostic Studies History Kylie Hurst, [place]; 04/02/2014 3:07 PM) Colonoscopy 1-5 years ago Mammogram 1-3 years ago Pap Smear never  Allergies Kylie Hurst, CMA; 04/02/2014 3:07 PM) No Known Drug Allergies 04/02/2014  Medication History Kylie Hurst, CMA; 04/02/2014 3:10 PM) Fosamax (70MG  Tablet, Oral) Active. Vitamin C (1000MG  Tablet, Oral) Active. B Complex (Oral) Active. Biotin-D Active. Calcium-Vitamin D (600MG  Tablet Chewable, Oral) Active. Cetirizine HCl (10MG  Tablet, Oral) Active. Benadryl (25MG  Tablet, Oral) Active. Hydrochlorothiazide (25MG  Tablet, Oral) Active. Lisinopril (5MG  Tablet, Oral) Active. MetFORMIN HCl (500MG  Tablet, Oral) Active. Multi Vitamin Daily (Oral) Active. Omega 3 (1000MG  Capsule, Oral) Active. Pravastatin Sodium (20MG  Tablet, Oral) Active. TiZANidine HCl (2MG  Capsule, Oral) Active. TraMADol HCl (50MG  Tablet, Oral) Active.  Social History Kylie Hurst, [place]; 04/02/2014 3:07 PM) Alcohol use Remotely quit alcohol use. Caffeine use Carbonated beverages,  Coffee. Illicit drug use Remotely quit drug use. Tobacco use  Current every day smoker.  Family History Kylie Hurst, [place]; 04/02/2014 3:07 PM) Alcohol Abuse Father, Mother. Arthritis Sister. Depression Mother. Diabetes Mellitus Father, Mother. Heart Disease Father. Hypertension Father. Migraine Headache Sister. Seizure disorder Brother.  Pregnancy / Birth History Kylie Hurst, [place]; 04/02/2014 3:07 PM) Age at menarche 89 years. Age of menopause 51-55 Contraceptive History Depo-provera, Oral contraceptives. Gravida 1 Irregular periods Maternal age 24-25 Para 1     Review of Systems Kylie Hurst Kylie Hurst CMA; 04/02/2014 3:07 PM) General Not Present- Appetite Loss, Chills, Fatigue, Fever, Night Sweats, Weight Gain and Weight Loss. Skin Not Present- Change in Wart/Mole, Dryness, Hives, Jaundice, New Lesions, Non-Healing Wounds, Rash and Ulcer. HEENT Present- Seasonal Allergies and Wears glasses/contact lenses. Not Present- Earache, Hearing Loss, Hoarseness, Nose Bleed, Oral Ulcers, Ringing in the Ears, Sinus Pain, Sore Throat, Visual Disturbances and Yellow Eyes. Respiratory Not Present- Bloody sputum, Chronic Cough, Difficulty Breathing, Snoring and Wheezing. Breast Not Present- Breast Mass, Breast Pain, Nipple Discharge and Skin Changes. Cardiovascular Present- Leg Cramps. Not Present- Chest Pain, Difficulty Breathing Lying Down, Palpitations, Rapid Heart Rate, Shortness of Breath and Swelling of Extremities. Gastrointestinal Present- Constipation. Not Present- Abdominal Pain, Bloating, Bloody Stool, Change in Bowel Habits, Chronic diarrhea, Difficulty Swallowing, Excessive gas, Gets full quickly at meals, Hemorrhoids, Indigestion, Nausea, Rectal Pain and Vomiting. Female Genitourinary Present- Urgency. Not Present- Frequency, Nocturia, Painful Urination and Pelvic Pain. Musculoskeletal Present- Back Pain. Not Present- Joint Pain, Joint Stiffness, Muscle Pain,  Muscle Weakness and Swelling of Extremities. Neurological Present- Numbness. Not Present- Decreased Memory, Fainting, Headaches, Seizures, Tingling, Tremor, Trouble walking and Weakness. Hematology Present- Easy Bruising. Not Present- Excessive bleeding, Gland problems, HIV and Persistent Infections.  Vitals [employer] Kylie Hurst CMA; 04/02/2014 3:07 PM) 04/02/2014 3:07 PM Weight: 108.13 lb Height: 57in Body Surface Area: 1.4 m Body Mass Index: 23.4 kg/m Temp.: 98.47F(Oral)  Pulse: 72 (Regular)  BP: 126/74 (Sitting, Left Arm, Standard)     Physical Exam Rodman Key K. Dorri Ozturk MD; 04/02/2014 5:13 PM)  The physical exam findings are as follows: Note:WDWN in NAD HEENT: EOMI, sclera anicteric Neck: No masses, no thyromegaly Lungs: CTA bilaterally; normal respiratory effort CV: Regular rate and rhythm; no murmurs Abd: +bowel sounds, soft, mildly tender in LLQ over palpable bulge The bulge is reducible when she is supine. Ext: Well-perfused; no edema Skin: Warm, dry; no sign of jaundice    Assessment & Plan Rodman Key K. Rickesha Veracruz MD; 04/02/2014 3:27 PM)  SPIGELIAN HERNIA (553.29  K43.9)  Current Plans Schedule for Surgery - Laparoscopic repair of left Spigelian hernia. The surgical procedure has been discussed with the patient. Potential risks, benefits, alternative treatments, and expected outcomes have been explained. All of the patient's questions at this time have been answered. The likelihood of reaching the patient's treatment goal is good. The patient understand the proposed surgical procedure and wishes to proceed. She understands that she is at higher risk for perioperative complications because of her continued tobacco abuse.   Kylie Hurst. Georgette Dover, MD, [facility] Surgery  General/ Trauma Surgery  04/02/2014 5:15 PM

## 2014-04-07 ENCOUNTER — Other Ambulatory Visit: Payer: Self-pay | Admitting: Physical Medicine & Rehabilitation

## 2014-04-07 ENCOUNTER — Telehealth: Payer: Self-pay | Admitting: *Deleted

## 2014-04-07 NOTE — Telephone Encounter (Signed)
I notifed Kylie Hurst I refilled her tramadol but she must keep appt that is scheduled for June 2016 with Dr Read Drivers.

## 2014-04-08 ENCOUNTER — Ambulatory Visit
Admission: RE | Admit: 2014-04-08 | Discharge: 2014-04-08 | Disposition: A | Discharge status: Home / Self Care | Payer: Medicare Other | Source: Ambulatory Visit | Attending: Specialist | Admitting: Specialist

## 2014-04-08 ENCOUNTER — Ambulatory Visit
Admission: RE | Admit: 2014-04-08 | Discharge: 2014-04-08 | Disposition: A | Discharge status: Home / Self Care | Payer: Medicare Other | Source: Ambulatory Visit

## 2014-04-08 DIAGNOSIS — Z1231 Encounter for screening mammogram for malignant neoplasm of breast: Secondary | ICD-10-CM

## 2014-04-08 DIAGNOSIS — M8580 Other specified disorders of bone density and structure, unspecified site: Secondary | ICD-10-CM

## 2014-05-06 ENCOUNTER — Encounter (HOSPITAL_COMMUNITY)
Admission: RE | Admit: 2014-05-06 | Discharge: 2014-05-06 | Disposition: A | Discharge status: Home / Self Care | Payer: Medicare Other | Source: Ambulatory Visit | Attending: Surgery | Admitting: Surgery

## 2014-05-06 ENCOUNTER — Encounter (HOSPITAL_COMMUNITY): Payer: Self-pay

## 2014-05-06 DIAGNOSIS — E119 Type 2 diabetes mellitus without complications: Secondary | ICD-10-CM | POA: Insufficient documentation

## 2014-05-06 DIAGNOSIS — Z01818 Encounter for other preprocedural examination: Secondary | ICD-10-CM | POA: Insufficient documentation

## 2014-05-06 DIAGNOSIS — Z981 Arthrodesis status: Secondary | ICD-10-CM | POA: Insufficient documentation

## 2014-05-06 DIAGNOSIS — G8929 Other chronic pain: Secondary | ICD-10-CM | POA: Insufficient documentation

## 2014-05-06 DIAGNOSIS — G2581 Restless legs syndrome: Secondary | ICD-10-CM | POA: Insufficient documentation

## 2014-05-06 DIAGNOSIS — K439 Ventral hernia without obstruction or gangrene: Secondary | ICD-10-CM | POA: Insufficient documentation

## 2014-05-06 DIAGNOSIS — I714 Abdominal aortic aneurysm, without rupture: Secondary | ICD-10-CM | POA: Insufficient documentation

## 2014-05-06 DIAGNOSIS — Z01812 Encounter for preprocedural laboratory examination: Secondary | ICD-10-CM | POA: Insufficient documentation

## 2014-05-06 DIAGNOSIS — J439 Emphysema, unspecified: Secondary | ICD-10-CM | POA: Insufficient documentation

## 2014-05-06 DIAGNOSIS — I10 Essential (primary) hypertension: Secondary | ICD-10-CM | POA: Diagnosis not present

## 2014-05-06 DIAGNOSIS — M549 Dorsalgia, unspecified: Secondary | ICD-10-CM | POA: Insufficient documentation

## 2014-05-06 DIAGNOSIS — M810 Age-related osteoporosis without current pathological fracture: Secondary | ICD-10-CM | POA: Diagnosis not present

## 2014-05-06 DIAGNOSIS — R9431 Abnormal electrocardiogram [ECG] [EKG]: Secondary | ICD-10-CM | POA: Insufficient documentation

## 2014-05-06 HISTORY — DX: Ventral hernia without obstruction or gangrene: K43.9

## 2014-05-06 HISTORY — DX: Presence of spectacles and contact lenses: Z97.3

## 2014-05-06 HISTORY — DX: Other allergic rhinitis: J30.89

## 2014-05-06 LAB — CBC
HEMATOCRIT: 46.4 % — AB (ref 36.0–46.0)
Hemoglobin: 16 g/dL — ABNORMAL HIGH (ref 12.0–15.0)
MCH: 32.4 pg (ref 26.0–34.0)
MCHC: 34.5 g/dL (ref 30.0–36.0)
MCV: 93.9 fL (ref 78.0–100.0)
Platelets: 195 10*3/uL (ref 150–400)
RBC: 4.94 MIL/uL (ref 3.87–5.11)
RDW: 14 % (ref 11.5–15.5)
WBC: 7.3 10*3/uL (ref 4.0–10.5)

## 2014-05-06 LAB — BASIC METABOLIC PANEL
ANION GAP: 7 (ref 5–15)
BUN: 12 mg/dL (ref 6–23)
CO2: 31 mmol/L (ref 19–32)
CREATININE: 0.7 mg/dL (ref 0.50–1.10)
Calcium: 10.6 mg/dL — ABNORMAL HIGH (ref 8.4–10.5)
Chloride: 100 mmol/L (ref 96–112)
GFR calc Af Amer: >90 mL/min (ref >90)
GFR calc non Af Amer: 87 mL/min — ABNORMAL LOW (ref >90)
Glucose, Bld: 93 mg/dL (ref 70–99)
Potassium: 4.2 mmol/L (ref 3.5–5.1)
SODIUM: 138 mmol/L (ref 135–145)

## 2014-05-06 NOTE — Progress Notes (Signed)
Pt denies SOB, chest pain, and being under the care of a cardiologist. Pt denies having a stress test, echo and cardiac cath. Pt denies having an EKG and chest x ray within the last year. 

## 2014-05-06 NOTE — Pre-Procedure Instructions (Signed)
Kylie Hurst  05/06/2014   Your procedure is scheduled on: Thursday, May 14, 2014  Report to [facility] Admitting at 7;00 AM.  Call this number if you have problems the morning of surgery: [phone]   Remember:   Do not eat food or drink liquids after midnight Wednesday, May 13, 2014   Take these medicines the morning of surgery with A SIP OF WATER: if needed: traMADol Veatrice Bourbon) for pain, hydrOXYzine (ATARAX/VISTARIL) for itching or anxiety.  DO NOT TAKE ANY DIABETIC MEDICATION THE MORNING OF PROCEDURE such as metFORMIN (GLUCOPHAGE)   Stop taking Aspirin, vitamins and herbal medications such as Omega-3 Fatty Acids (FISH OIL).  Do not take any NSAIDs ie: Ibuprofen, Advil, Naproxen or any medication containing Aspirin; stop 1 week prior to procedure ( Thursday May 07, 2014 ).  Do not wear jewelry, make-up or nail polish.  Do not wear lotions, powders, or perfumes. You may not wear deodorant.  Do not shave 48 hours prior to surgery.   Do not bring valuables to the hospital.  [facility] is not responsible for any belongings or valuables.               Contacts, dentures or bridgework may not be worn into surgery.  Leave suitcase in the car. After surgery it may be brought to your room.  For patients admitted to the hospital, discharge time is determined by your treatment team.               Patients discharged the day of surgery will not be allowed to drive home.  Name and phone number of your driver:   Special Instructions:  Special Instructions:Special Instructions: [facility] - Preparing for Surgery  Before surgery, you can play an important role.  Because skin is not sterile, your skin needs to be as free of germs as possible.  You can reduce the number of germs on you skin by washing with CHG (chlorahexidine gluconate) soap before surgery.  CHG is an antiseptic cleaner which kills germs and bonds with the skin to continue killing germs even after  washing.  Please DO NOT use if you have an allergy to CHG or antibacterial soaps.  If your skin becomes reddened/irritated stop using the CHG and inform your nurse when you arrive at Short Stay.  Do not shave (including legs and underarms) for at least 48 hours prior to the first CHG shower.  You may shave your face.  Please follow these instructions carefully:   1.  Shower with CHG Soap the night before surgery and the morning of Surgery.  2.  If you choose to wash your hair, wash your hair first as usual with your normal shampoo.  3.  After you shampoo, rinse your hair and body thoroughly to remove the Shampoo.  4.  Use CHG as you would any other liquid soap.  You can apply chg directly  to the skin and wash gently with scrungie or a clean washcloth.  5.  Apply the CHG Soap to your body ONLY FROM THE NECK DOWN.  Do not use on open wounds or open sores.  Avoid contact with your eyes, ears, mouth and genitals (private parts).  Wash genitals (private parts) with your normal soap.  6.  Wash thoroughly, paying special attention to the area where your surgery will be performed.  7.  Thoroughly rinse your body with warm water from the neck down.  8.  DO NOT shower/wash with your normal  soap after using and rinsing off the CHG Soap.  9.  Pat yourself dry with a clean towel.            10.  Wear clean pajamas.            11.  Place clean sheets on your bed the night of your first shower and do not sleep with pets.  Day of Surgery  Do not apply any lotions/deodorants the morning of surgery.  Please wear clean clothes to the hospital/surgery center.   Please read over the following fact sheets that you were given: Pain Booklet, Coughing and Deep Breathing and Surgical Site Infection Prevention

## 2014-05-08 NOTE — Progress Notes (Addendum)
Anesthesia Chart Review: Patient is a 69 year old female scheduled for laparoscopic repair of left spigelian hernia on 05/14/14 by Dr. Georgette Dover.  History includes smoking, HTN, AAA (3.7 cm by 06/14/12 duplex; 2 year follow-up recommended by Dr. Kellie Simmering at [facility]), emphysema, DM2, depression, RLS, osteoporosis, ovarian cysts, arthritis, chronic back pain, C7-T1 ACDF 11/2012.BMI 23.65.  PCP is Dr. [name]. She is scheduled to see Dr. Kellie Simmering next on 07/14/14 with abdominal ultrasound.   EKG 05/06/14: NSR, septal infarct (age undetermined). No significant change from prior tracing on 12/13/12.    Preoperative labs noted.  I discussed AAA history with anesthesiologist Dr. Deatra Canter.  AAA size stable from 2011-2014.  By prior vascular surgery recommendations, she is not quite due for re-evaluation; however with upcoming surgery recommend discussing with Dr. Georgette Dover to see if he would like AAA re-evaluated prior to surgery versus follow-up as planned in June. I sent a staff message to Dr. Georgette Dover.  George Hugh [facility] Short Stay Center/Anesthesiology Phone [phone] 05/08/2014 3:57 PM  Addendum: Abdominal aortic duplex repeated on 05/12/14 and showed a stable 3.7 X 3.7 cm distal AAA.  George Hugh [facility] Short Stay Center/Anesthesiology Phone [phone] 05/13/2014 4:41 PM

## 2014-05-11 ENCOUNTER — Other Ambulatory Visit: Payer: Self-pay | Admitting: *Deleted

## 2014-05-11 DIAGNOSIS — I714 Abdominal aortic aneurysm, without rupture, unspecified: Secondary | ICD-10-CM

## 2014-05-11 NOTE — Progress Notes (Signed)
Patient called and reported that she was using a topical steroid cream on her neck in 2 places and requested to know if she could use cream before surgery. I advised patient to use cream in the afternoon the day before surgery and not to use after she took her first CHG bath. Patient verbalized understanding.

## 2014-05-12 ENCOUNTER — Ambulatory Visit (HOSPITAL_COMMUNITY)
Admission: RE | Admit: 2014-05-12 | Discharge: 2014-05-12 | Disposition: A | Discharge status: Home / Self Care | Payer: Medicare Other | Source: Ambulatory Visit | Attending: Vascular Surgery | Admitting: Vascular Surgery

## 2014-05-12 DIAGNOSIS — I714 Abdominal aortic aneurysm, without rupture, unspecified: Secondary | ICD-10-CM

## 2014-05-13 MED ORDER — CEFAZOLIN SODIUM-DEXTROSE 2-3 GM-% IV SOLR
2.0000 g | INTRAVENOUS | Status: AC
  Administered 2014-05-14: 2 g via INTRAVENOUS
  Filled 2014-05-13: qty 50

## 2014-05-13 MED ORDER — CHLORHEXIDINE GLUCONATE 4 % EX LIQD
1.0000 "application " | Freq: Once | CUTANEOUS | Status: DC
  Filled 2014-05-13: qty 15

## 2014-05-14 ENCOUNTER — Ambulatory Visit (HOSPITAL_COMMUNITY): Payer: Medicare Other | Admitting: Vascular Surgery

## 2014-05-14 ENCOUNTER — Observation Stay (HOSPITAL_COMMUNITY)
Admission: RE | Admit: 2014-05-14 | Discharge: 2014-05-15 | Disposition: A | Discharge status: Home / Self Care | Payer: Medicare Other | Source: Ambulatory Visit | Attending: Surgery | Admitting: Surgery

## 2014-05-14 ENCOUNTER — Encounter (HOSPITAL_COMMUNITY): Payer: Self-pay | Admitting: *Deleted

## 2014-05-14 ENCOUNTER — Ambulatory Visit (HOSPITAL_COMMUNITY): Payer: Medicare Other | Admitting: Anesthesiology

## 2014-05-14 ENCOUNTER — Encounter (HOSPITAL_COMMUNITY)
Admission: RE | Disposition: A | Discharge status: Home / Self Care | Payer: Self-pay | Source: Ambulatory Visit | Attending: Surgery

## 2014-05-14 DIAGNOSIS — F329 Major depressive disorder, single episode, unspecified: Secondary | ICD-10-CM | POA: Insufficient documentation

## 2014-05-14 DIAGNOSIS — I10 Essential (primary) hypertension: Secondary | ICD-10-CM | POA: Insufficient documentation

## 2014-05-14 DIAGNOSIS — Z87891 Personal history of nicotine dependence: Secondary | ICD-10-CM | POA: Diagnosis not present

## 2014-05-14 DIAGNOSIS — I252 Old myocardial infarction: Secondary | ICD-10-CM | POA: Insufficient documentation

## 2014-05-14 DIAGNOSIS — K4090 Unilateral inguinal hernia, without obstruction or gangrene, not specified as recurrent: Secondary | ICD-10-CM | POA: Diagnosis present

## 2014-05-14 DIAGNOSIS — I509 Heart failure, unspecified: Secondary | ICD-10-CM | POA: Insufficient documentation

## 2014-05-14 DIAGNOSIS — J449 Chronic obstructive pulmonary disease, unspecified: Secondary | ICD-10-CM | POA: Diagnosis not present

## 2014-05-14 DIAGNOSIS — I739 Peripheral vascular disease, unspecified: Secondary | ICD-10-CM | POA: Insufficient documentation

## 2014-05-14 DIAGNOSIS — M1990 Unspecified osteoarthritis, unspecified site: Secondary | ICD-10-CM | POA: Diagnosis not present

## 2014-05-14 DIAGNOSIS — E119 Type 2 diabetes mellitus without complications: Secondary | ICD-10-CM | POA: Insufficient documentation

## 2014-05-14 DIAGNOSIS — K439 Ventral hernia without obstruction or gangrene: Secondary | ICD-10-CM | POA: Diagnosis present

## 2014-05-14 HISTORY — PX: SPIGELIAN HERNIA: SHX6100

## 2014-05-14 HISTORY — PX: INSERTION OF MESH: SHX5868

## 2014-05-14 HISTORY — PX: VENTRAL HERNIA REPAIR: SHX424

## 2014-05-14 LAB — CBC
HEMATOCRIT: 41 % (ref 36.0–46.0)
HEMOGLOBIN: 13.5 g/dL (ref 12.0–15.0)
MCH: 31.4 pg (ref 26.0–34.0)
MCHC: 32.9 g/dL (ref 30.0–36.0)
MCV: 95.3 fL (ref 78.0–100.0)
Platelets: 175 10*3/uL (ref 150–400)
RBC: 4.3 MIL/uL (ref 3.87–5.11)
RDW: 14.2 % (ref 11.5–15.5)
WBC: 10.9 10*3/uL — ABNORMAL HIGH (ref 4.0–10.5)

## 2014-05-14 LAB — CREATININE, SERUM
Creatinine, Ser: 0.68 mg/dL (ref 0.50–1.10)
GFR, EST NON AFRICAN AMERICAN: 88 mL/min — AB (ref >90)

## 2014-05-14 LAB — GLUCOSE, CAPILLARY
GLUCOSE-CAPILLARY: 122 mg/dL — AB (ref 70–99)
GLUCOSE-CAPILLARY: 109 mg/dL — AB (ref 70–99)

## 2014-05-14 SURGERY — REPAIR, HERNIA, VENTRAL, LAPAROSCOPIC
Anesthesia: General | Site: Abdomen | Laterality: Left

## 2014-05-14 MED ORDER — OXYCODONE HCL 5 MG/5ML PO SOLN: 5.0000 mg | Freq: Once | ORAL | Status: AC | PRN

## 2014-05-14 MED ORDER — ROCURONIUM BROMIDE 100 MG/10ML IV SOLN
INTRAVENOUS | Status: DC | PRN
  Administered 2014-05-14: 30 mg via INTRAVENOUS

## 2014-05-14 MED ORDER — LIDOCAINE HCL (CARDIAC) 20 MG/ML IV SOLN
INTRAVENOUS | Status: DC | PRN
  Administered 2014-05-14: 60 mg via INTRAVENOUS

## 2014-05-14 MED ORDER — DEXAMETHASONE SODIUM PHOSPHATE 4 MG/ML IJ SOLN
INTRAMUSCULAR | Status: DC | PRN
  Administered 2014-05-14: 4 mg via INTRAVENOUS

## 2014-05-14 MED ORDER — SODIUM CHLORIDE 0.9 % IR SOLN
Status: DC | PRN
  Administered 2014-05-14: 1000 mL

## 2014-05-14 MED ORDER — LIDOCAINE HCL (CARDIAC) 20 MG/ML IV SOLN
INTRAVENOUS | Status: AC
  Filled 2014-05-14: qty 5

## 2014-05-14 MED ORDER — POTASSIUM CHLORIDE IN NACL 20-0.9 MEQ/L-% IV SOLN
INTRAVENOUS | Status: DC
  Administered 2014-05-14 – 2014-05-15 (×2): via INTRAVENOUS
  Filled 2014-05-14 (×2): qty 1000

## 2014-05-14 MED ORDER — ONDANSETRON HCL 4 MG/2ML IJ SOLN
INTRAMUSCULAR | Status: AC
  Filled 2014-05-14: qty 2

## 2014-05-14 MED ORDER — CYCLOBENZAPRINE HCL 5 MG PO TABS: 7.5000 mg | ORAL_TABLET | Freq: Three times a day (TID) | ORAL | Status: DC | PRN

## 2014-05-14 MED ORDER — GLYCOPYRROLATE 0.2 MG/ML IJ SOLN
INTRAMUSCULAR | Status: DC | PRN
  Administered 2014-05-14: 0.4 mg via INTRAVENOUS

## 2014-05-14 MED ORDER — DEXAMETHASONE SODIUM PHOSPHATE 4 MG/ML IJ SOLN
INTRAMUSCULAR | Status: AC
  Filled 2014-05-14: qty 1

## 2014-05-14 MED ORDER — OXYCODONE-ACETAMINOPHEN 5-325 MG PO TABS
1.0000 | ORAL_TABLET | ORAL | Status: DC | PRN
  Administered 2014-05-14: 1 via ORAL
  Administered 2014-05-15: 2 via ORAL
  Administered 2014-05-15: 1 via ORAL
  Filled 2014-05-14: qty 1
  Filled 2014-05-14: qty 2
  Filled 2014-05-14: qty 1

## 2014-05-14 MED ORDER — OXYCODONE HCL 5 MG PO TABS
ORAL_TABLET | ORAL | Status: AC
  Filled 2014-05-14: qty 1

## 2014-05-14 MED ORDER — HYDROXYZINE HCL 10 MG PO TABS
10.0000 mg | ORAL_TABLET | Freq: Three times a day (TID) | ORAL | Status: DC | PRN
  Filled 2014-05-14: qty 1

## 2014-05-14 MED ORDER — CEFAZOLIN SODIUM 1-5 GM-% IV SOLN
1.0000 g | Freq: Four times a day (QID) | INTRAVENOUS | Status: AC
  Administered 2014-05-14 – 2014-05-15 (×3): 1 g via INTRAVENOUS
  Filled 2014-05-14 (×4): qty 50

## 2014-05-14 MED ORDER — ARTIFICIAL TEARS OP OINT
TOPICAL_OINTMENT | OPHTHALMIC | Status: DC | PRN
  Administered 2014-05-14: 1 via OPHTHALMIC

## 2014-05-14 MED ORDER — ETOMIDATE 2 MG/ML IV SOLN
INTRAVENOUS | Status: AC
  Filled 2014-05-14: qty 10

## 2014-05-14 MED ORDER — ACETAMINOPHEN 160 MG/5ML PO SOLN
325.0000 mg | ORAL | Status: DC | PRN
  Filled 2014-05-14: qty 20.3

## 2014-05-14 MED ORDER — DEXTROSE 5 % IV SOLN
10.0000 mg | INTRAVENOUS | Status: DC | PRN
  Administered 2014-05-14: 50 ug/min via INTRAVENOUS

## 2014-05-14 MED ORDER — HYDROMORPHONE HCL 1 MG/ML IJ SOLN: 1.0000 mg | INTRAMUSCULAR | Status: DC | PRN

## 2014-05-14 MED ORDER — PROPOFOL 10 MG/ML IV BOLUS
INTRAVENOUS | Status: DC | PRN
  Administered 2014-05-14: 10 mg via INTRAVENOUS
  Administered 2014-05-14: 100 mg via INTRAVENOUS

## 2014-05-14 MED ORDER — PHENYLEPHRINE HCL 10 MG/ML IJ SOLN
INTRAMUSCULAR | Status: DC | PRN
  Administered 2014-05-14: 80 ug via INTRAVENOUS
  Administered 2014-05-14: 120 ug via INTRAVENOUS
  Administered 2014-05-14 (×2): 80 ug via INTRAVENOUS
  Administered 2014-05-14: 120 ug via INTRAVENOUS

## 2014-05-14 MED ORDER — ACETAMINOPHEN 325 MG PO TABS: 325.0000 mg | ORAL_TABLET | ORAL | Status: DC | PRN

## 2014-05-14 MED ORDER — MIDAZOLAM HCL 2 MG/2ML IJ SOLN
INTRAMUSCULAR | Status: AC
  Filled 2014-05-14: qty 2

## 2014-05-14 MED ORDER — PHENYLEPHRINE 40 MCG/ML (10ML) SYRINGE FOR IV PUSH (FOR BLOOD PRESSURE SUPPORT)
PREFILLED_SYRINGE | INTRAVENOUS | Status: AC
  Filled 2014-05-14: qty 10

## 2014-05-14 MED ORDER — MIDAZOLAM HCL 5 MG/5ML IJ SOLN
INTRAMUSCULAR | Status: DC | PRN
  Administered 2014-05-14: 1 mg via INTRAVENOUS

## 2014-05-14 MED ORDER — BUPIVACAINE-EPINEPHRINE 0.25% -1:200000 IJ SOLN
INTRAMUSCULAR | Status: DC | PRN
  Administered 2014-05-14: 13 mL

## 2014-05-14 MED ORDER — FENTANYL CITRATE 0.05 MG/ML IJ SOLN
INTRAMUSCULAR | Status: DC | PRN
  Administered 2014-05-14 (×4): 50 ug via INTRAVENOUS

## 2014-05-14 MED ORDER — FENTANYL CITRATE 0.05 MG/ML IJ SOLN
INTRAMUSCULAR | Status: AC
  Filled 2014-05-14: qty 5

## 2014-05-14 MED ORDER — LISINOPRIL 5 MG PO TABS
5.0000 mg | ORAL_TABLET | Freq: Every day | ORAL | Status: DC
Start: 2014-05-14 — End: 2014-05-15
  Administered 2014-05-14: 5 mg via ORAL
  Filled 2014-05-14: qty 1

## 2014-05-14 MED ORDER — PRAVASTATIN SODIUM 10 MG PO TABS
20.0000 mg | ORAL_TABLET | Freq: Every day | ORAL | Status: DC
  Administered 2014-05-14: 20 mg via ORAL
  Filled 2014-05-14: qty 2

## 2014-05-14 MED ORDER — ONDANSETRON HCL 4 MG/2ML IJ SOLN: 4.0000 mg | Freq: Four times a day (QID) | INTRAMUSCULAR | Status: DC | PRN

## 2014-05-14 MED ORDER — OXYCODONE HCL 5 MG PO TABS
5.0000 mg | ORAL_TABLET | Freq: Once | ORAL | Status: AC | PRN
  Administered 2014-05-14: 5 mg via ORAL

## 2014-05-14 MED ORDER — ONDANSETRON HCL 4 MG/2ML IJ SOLN
INTRAMUSCULAR | Status: DC | PRN
  Administered 2014-05-14: 4 mg via INTRAVENOUS

## 2014-05-14 MED ORDER — ARTIFICIAL TEARS OP OINT
TOPICAL_OINTMENT | OPHTHALMIC | Status: AC
  Filled 2014-05-14: qty 3.5

## 2014-05-14 MED ORDER — HYDROMORPHONE HCL 1 MG/ML IJ SOLN
0.2500 mg | INTRAMUSCULAR | Status: DC | PRN
  Administered 2014-05-14 (×2): 0.5 mg via INTRAVENOUS

## 2014-05-14 MED ORDER — LACTATED RINGERS IV SOLN
INTRAVENOUS | Status: DC
  Administered 2014-05-14: 08:00:00 via INTRAVENOUS

## 2014-05-14 MED ORDER — BUPIVACAINE-EPINEPHRINE (PF) 0.25% -1:200000 IJ SOLN
INTRAMUSCULAR | Status: AC
  Filled 2014-05-14: qty 30

## 2014-05-14 MED ORDER — ENOXAPARIN SODIUM 40 MG/0.4ML ~~LOC~~ SOLN
40.0000 mg | SUBCUTANEOUS | Status: DC
  Administered 2014-05-15: 40 mg via SUBCUTANEOUS
  Filled 2014-05-14: qty 0.4

## 2014-05-14 MED ORDER — LACTATED RINGERS IV SOLN
INTRAVENOUS | Status: DC | PRN
  Administered 2014-05-14 (×2): via INTRAVENOUS

## 2014-05-14 MED ORDER — ONDANSETRON HCL 4 MG PO TABS: 4.0000 mg | ORAL_TABLET | Freq: Four times a day (QID) | ORAL | Status: DC | PRN

## 2014-05-14 MED ORDER — METFORMIN HCL 500 MG PO TABS
500.0000 mg | ORAL_TABLET | Freq: Every day | ORAL | Status: DC
  Administered 2014-05-15: 500 mg via ORAL
  Filled 2014-05-14: qty 1

## 2014-05-14 MED ORDER — NEOSTIGMINE METHYLSULFATE 10 MG/10ML IV SOLN
INTRAVENOUS | Status: DC | PRN
  Administered 2014-05-14: 3 mg via INTRAVENOUS

## 2014-05-14 MED ORDER — HYDROCHLOROTHIAZIDE 25 MG PO TABS
12.5000 mg | ORAL_TABLET | Freq: Every day | ORAL | Status: DC
  Administered 2014-05-14 – 2014-05-15 (×2): 12.5 mg via ORAL
  Filled 2014-05-14 (×2): qty 1

## 2014-05-14 MED ORDER — HYDROMORPHONE HCL 1 MG/ML IJ SOLN
INTRAMUSCULAR | Status: AC
  Filled 2014-05-14: qty 1

## 2014-05-14 SURGICAL SUPPLY — 62 items
APPLIER CLIP LOGIC TI 5 (MISCELLANEOUS) IMPLANT
APPLIER CLIP ROT 10 11.4 M/L (STAPLE)
BENZOIN TINCTURE PRP APPL 2/3 (GAUZE/BANDAGES/DRESSINGS) ×2 IMPLANT
BINDER ABD UNIV 12 45-62 (WOUND CARE) IMPLANT
BINDER ABDOMINAL 46IN 62IN (WOUND CARE)
BLADE SURG 10 STRL SS (BLADE) ×2 IMPLANT
BLADE SURG ROTATE 9660 (MISCELLANEOUS) ×2 IMPLANT
CANISTER SUCTION 2500CC (MISCELLANEOUS) IMPLANT
CHLORAPREP W/TINT 26ML (MISCELLANEOUS) ×2 IMPLANT
CLIP APPLIE ROT 10 11.4 M/L (STAPLE) IMPLANT
COVER SURGICAL LIGHT HANDLE (MISCELLANEOUS) ×2 IMPLANT
DECANTER SPIKE VIAL GLASS SM (MISCELLANEOUS) IMPLANT
DEVICE SECURE STRAP 25 ABSORB (INSTRUMENTS) ×2 IMPLANT
DEVICE TROCAR PUNCTURE CLOSURE (ENDOMECHANICALS) ×2 IMPLANT
DRAPE LAPAROSCOPIC ABDOMINAL (DRAPES) ×2 IMPLANT
DRSG TEGADERM 2-3/8X2-3/4 SM (GAUZE/BANDAGES/DRESSINGS) ×6 IMPLANT
DRSG TEGADERM 4X4.75 (GAUZE/BANDAGES/DRESSINGS) ×2 IMPLANT
ELECT CAUTERY BLADE 6.4 (BLADE) ×2 IMPLANT
ELECT REM PT RETURN 9FT ADLT (ELECTROSURGICAL) ×2
ELECTRODE REM PT RTRN 9FT ADLT (ELECTROSURGICAL) ×1 IMPLANT
FILTER SMOKE EVAC LAPAROSHD (FILTER) IMPLANT
GAUZE SPONGE 2X2 8PLY STRL LF (GAUZE/BANDAGES/DRESSINGS) ×1 IMPLANT
GLOVE BIO SURGEON STRL SZ 6.5 (GLOVE) ×2 IMPLANT
GLOVE BIO SURGEON STRL SZ7 (GLOVE) ×2 IMPLANT
GLOVE BIO SURGEON STRL SZ7.5 (GLOVE) ×2 IMPLANT
GLOVE BIOGEL PI IND STRL 6.5 (GLOVE) ×2 IMPLANT
GLOVE BIOGEL PI IND STRL 7.0 (GLOVE) ×1 IMPLANT
GLOVE BIOGEL PI IND STRL 7.5 (GLOVE) ×2 IMPLANT
GLOVE BIOGEL PI INDICATOR 6.5 (GLOVE) ×2
GLOVE BIOGEL PI INDICATOR 7.0 (GLOVE) ×1
GLOVE BIOGEL PI INDICATOR 7.5 (GLOVE) ×2
GOWN STRL REUS W/ TWL LRG LVL3 (GOWN DISPOSABLE) ×3 IMPLANT
GOWN STRL REUS W/TWL LRG LVL3 (GOWN DISPOSABLE) ×3
KIT BASIN OR (CUSTOM PROCEDURE TRAY) ×2 IMPLANT
KIT ROOM TURNOVER OR (KITS) ×2 IMPLANT
LIQUID BAND (GAUZE/BANDAGES/DRESSINGS) ×2 IMPLANT
MARKER SKIN DUAL TIP RULER LAB (MISCELLANEOUS) ×2 IMPLANT
MESH ULTRAPRO 3X6 7.6X15CM (Mesh General) ×2 IMPLANT
NEEDLE SPNL 22GX3.5 QUINCKE BK (NEEDLE) ×2 IMPLANT
NS IRRIG 1000ML POUR BTL (IV SOLUTION) ×2 IMPLANT
PAD ARMBOARD 7.5X6 YLW CONV (MISCELLANEOUS) ×4 IMPLANT
PENCIL BUTTON HOLSTER BLD 10FT (ELECTRODE) ×2 IMPLANT
SCALPEL HARMONIC ACE (MISCELLANEOUS) IMPLANT
SCISSORS LAP 5X35 DISP (ENDOMECHANICALS) ×2 IMPLANT
SET IRRIG TUBING LAPAROSCOPIC (IRRIGATION / IRRIGATOR) IMPLANT
SLEEVE ENDOPATH XCEL 5M (ENDOMECHANICALS) ×2 IMPLANT
SPONGE GAUZE 2X2 STER 10/PKG (GAUZE/BANDAGES/DRESSINGS) ×1
STRIP CLOSURE SKIN 1/2X4 (GAUZE/BANDAGES/DRESSINGS) ×2 IMPLANT
SUT MNCRL AB 4-0 PS2 18 (SUTURE) ×4 IMPLANT
SUT NOVA NAB DX-16 0-1 5-0 T12 (SUTURE) ×2 IMPLANT
SUT NOVA NAB GS-21 0 18 T12 DT (SUTURE) ×2 IMPLANT
SUT VIC AB 3-0 SH 27 (SUTURE) ×1
SUT VIC AB 3-0 SH 27XBRD (SUTURE) ×1 IMPLANT
TOWEL OR 17X24 6PK STRL BLUE (TOWEL DISPOSABLE) ×2 IMPLANT
TOWEL OR 17X26 10 PK STRL BLUE (TOWEL DISPOSABLE) ×2 IMPLANT
TRAY FOLEY CATH 14FRSI W/METER (CATHETERS) IMPLANT
TRAY LAPAROSCOPIC (CUSTOM PROCEDURE TRAY) ×2 IMPLANT
TROCAR XCEL BLUNT TIP 100MML (ENDOMECHANICALS) IMPLANT
TROCAR XCEL NON-BLD 11X100MML (ENDOMECHANICALS) ×2 IMPLANT
TROCAR XCEL NON-BLD 5MMX100MML (ENDOMECHANICALS) ×2 IMPLANT
TUBING INSUFFLATION (TUBING) ×2 IMPLANT
WATER STERILE IRR 1000ML POUR (IV SOLUTION) IMPLANT

## 2014-05-14 NOTE — Transfer of Care (Signed)
Immediate Anesthesia Transfer of Care Note  Patient: Kylie Hurst  Procedure(s) Performed: Procedure(s): ATTEMPTED LAPAROSCOPIC REPAIR LEFT [place] CONVERTED TO OPEN REPAIR (Left) INSERTION OF MESH (Left)  Patient Location: PACU  Anesthesia Type:General  Level of Consciousness: awake, alert  and oriented  Airway & Oxygen Therapy: Patient Spontanous Breathing and Patient connected to nasal cannula oxygen  Post-op Assessment: Report given to RN and Post -op Vital signs reviewed and stable  Post vital signs: Reviewed and stable  Last Vitals:  Filed Vitals:   05/14/14 0722  BP: 129/73  Pulse: 80  Temp: 88.8 C    Complications: No apparent anesthesia complications

## 2014-05-14 NOTE — Anesthesia Procedure Notes (Signed)
Procedure Name: Intubation Date/Time: 05/14/2014 9:12 AM Performed by: Susa Loffler Pre-anesthesia Checklist: Patient identified, Timeout performed, Emergency Drugs available, Suction available and Patient being monitored Patient Re-evaluated:Patient Re-evaluated prior to inductionOxygen Delivery Method: Circle system utilized Preoxygenation: Pre-oxygenation with 100% oxygen Intubation Type: IV induction Ventilation: Mask ventilation without difficulty and Oral airway inserted - appropriate to patient size Laryngoscope Size: Mac and 3 Grade View: Grade I Tube type: Oral Tube size: 7.0 mm Number of attempts: 1 Airway Equipment and Method: Stylet and LTA kit utilized Placement Confirmation: ETT inserted through vocal cords under direct vision,  positive ETCO2 and breath sounds checked- equal and bilateral Secured at: 21 cm Tube secured with: Tape Dental Injury: Teeth and Oropharynx as per pre-operative assessment

## 2014-05-14 NOTE — Anesthesia Preprocedure Evaluation (Addendum)
Anesthesia Evaluation  Patient identified by MRN, date of birth, ID band Patient awake    Reviewed: Allergy & Precautions, NPO status , Patient's Chart, lab work & pertinent test results  History of Anesthesia Complications Negative for: history of anesthetic complications  Airway Mallampati: II  TM Distance: >3 FB Neck ROM: Full    Dental  (+) Edentulous Upper, Edentulous Lower   Pulmonary COPDCurrent Smoker,    + decreased breath sounds      Cardiovascular hypertension, Pt. on medications - angina+ Peripheral Vascular Disease - Past MI and - CHF Rhythm:Regular     Neuro/Psych PSYCHIATRIC DISORDERS Depression  Neuromuscular disease    GI/Hepatic negative GI ROS, Neg liver ROS,   Endo/Other  diabetes, Type 2, Oral Hypoglycemic Agents  Renal/GU negative Renal ROS     Musculoskeletal  (+) Arthritis -,   Abdominal   Peds  Hematology negative hematology ROS (+)   Anesthesia Other Findings   Reproductive/Obstetrics                            Anesthesia Physical Anesthesia Plan  ASA: III  Anesthesia Plan: General   Post-op Pain Management:    Induction: Intravenous  Airway Management Planned: Oral ETT  Additional Equipment: None  Intra-op Plan:   Post-operative Plan: Extubation in OR  Informed Consent: I have reviewed the patients History and Physical, chart, labs and discussed the procedure including the risks, benefits and alternatives for the proposed anesthesia with the patient or authorized representative who has indicated his/her understanding and acceptance.   Dental advisory given  Plan Discussed with: Surgeon and CRNA  Anesthesia Plan Comments:        Anesthesia Quick Evaluation

## 2014-05-14 NOTE — Anesthesia Postprocedure Evaluation (Signed)
  Anesthesia Post-op Note  Patient: Kylie Hurst  Procedure(s) Performed: Procedure(s): ATTEMPTED LAPAROSCOPIC REPAIR LEFT [place] CONVERTED TO OPEN REPAIR (Left) INSERTION OF MESH (Left)  Patient Location: PACU  Anesthesia Type:General  Level of Consciousness: awake, alert  and oriented  Airway and Oxygen Therapy: Patient Spontanous Breathing  Post-op Pain: mild  Post-op Assessment: Post-op Vital signs reviewed, Patient's Cardiovascular Status Stable, Respiratory Function Stable, Patent Airway, No signs of Nausea or vomiting and Pain level controlled  Post-op Vital Signs: Reviewed and stable  Last Vitals:  Filed Vitals:   05/14/14 1300  BP: 110/59  Pulse: 75  Temp: 36.1 C  Resp: 16    Complications: No apparent anesthesia complications

## 2014-05-14 NOTE — Op Note (Signed)
Preop diagnosis: Left abdominal wall spigelian hernia Post-op Diagnosis;  Same Procedure: Laparoscopic assisted left spigelian hernia repair with mesh Surgeon:  Basilio Meadow K. Anesthesia Gen. endotracheal Indications: This is a 69 year old female with a long history of tobacco abuse who presents with a left lower quadrant ventral hernia. This has been seen on CT scan for about 3 years and has enlarged. It does not appear to be an inguinal hernia but rather is in the musculature of the left lower quadrant abdominal wall. This hernia contains a large amount of small bowel but she has not had any obstruction yet. She presents now for repair.  Description of procedure: The patient brought to the operating room and placed in a supine position on the operating table. After an adequate level of general anesthesia was obtained her abdomen was prepped with ChloraPrep and draped sterile fashion. A Foley catheter had been placed by nursing. A timeout was taken to ensure the proper patient proper procedure. A 5 mm Optiview trocar was used to cannulate the peritoneal cavity in the right upper quadrant. Pneumoperitoneum was obtained by insufflating CO2 maintaining a maximum pressure of 15 mmHg. The laparoscope was inserted. 2 additional ports were placed along the right side. One was an 11 mm port needle was 5 mm port. We examined the left lower quadrant. The small bowel is seen entering a hernia defect low on the left side. We were able to reduce the small bowel out of this area. There was approximately 30 cm small bowel incarcerated within the hernia sac. Once we had reduce this, the hernia defect appeared to be only about 2 cm across. There are some large vessels that are immediately adjacent to this area. I made the decision to make a small incision of this area and repair this anteriorly. Using our visual landmarks with the laparoscope we made a 3 cm incision directly over the hernia. We released our insufflation.  Dissection was carried down to the external oblique fascia which was opened. The hernia sac was encountered. We excised most of the hernia sac. The hernia defect appears to be about 2 cm across. We carefully placed multiple 0 Novafil figure-of-eight sutures to close the hernia defect. An oval-shaped piece of ultra Pro mesh was cut to fit and was placed in the space between the internal oblique and the external oblique fascia. This mesh was secured with interrupted 0 Novafil sutures. The external oblique fascia was reapproximated with #1 Novafil suture. We reinsufflated the abdomen with CO2. The hernia defect was completely closed and there is no sign of bleeding. The small bowel is free from the area of the hernia. We released our insufflation after closing the 11 mm port site with 0 Vicryl. The left groin wound was then closed with 3-0 Vicryl and 4-0 Monocryl. The port sites were closed with 4-0 Monocryl. Steri-Strips and clean dressings were applied. The patient was then extubated and brought to recovery in stable condition. All sponge, instrument, and needle counts are correct.   Imogene Burn. Georgette Dover, MD, [facility]  General/ Trauma Surgery  05/14/2014 10:44 AM

## 2014-05-14 NOTE — H&P (Signed)
History of Present Illness  Patient words: discuss hernia surgery.  The patient is a 69 year old female who presents with an inguinal hernia. Referred by Dr. [name] for ventral hernia.  This is a 69 year old female with a long history of smoking who presents for follow-up of a left lower quadrant ventral hernia. This has been present for about 3 years but seems to be getting larger. It causes some significant discomfort. The discomfort feels better when she is supine. She denies any nausea or vomiting. She continues to move her bowels daily. She continues to smoke despite repeated discussions on the importance of smoking cessation. She did well with her cervical spine surgery in 2014. She presents today to discuss possible hernia repair.     CT scan 2014  RADIOLOGY REPORT*  Clinical Data: Pelvic pain. Palpable abnormality in left pelvis. Right ovarian cyst.  CT PELVIS WITH CONTRAST  Technique: Multidetector CT imaging of the pelvis was performed using the standard protocol following the bolus administration of intravenous contrast.  Contrast: 166mL OMNIPAQUE IOHEXOL 300 MG/ML SOLN  Comparison: CT on 03/26/2009  Findings: A left lower quadrant ventral hernia is seen containing a loop of small bowel, which is new since previous study. This corresponds with the palpable abnormality. There is no evidence of small bowel obstruction or ischemia.  A benign appearing cyst is again seen in the right adnexa which measures 2.4 x 3.3 cm, and is not significantly changed in size or appearance since prior CT. This is consistent with a benign etiology. Uterus and left adnexa are unremarkable. No other pelvic masses or lymphadenopathy identified. No evidence of ascites or inflammatory process.  An infrarenal abdominal aortic aneurysm is seen which measures 4.1 x 3.9 cm, and also shows no significant change compared to prior study.  IMPRESSION:  1. Left  lower quadrant ventral hernia containing small bowel. This corresponds to the palpable abnormality. There is no evidence of small bowel obstruction or ischemia. 2. Stable 3 cm benign appearing right adnexal cyst. 3. Stable 4.1 cm infrarenal abdominal aortic aneurysm. Recommend continued followup with [place] in 1 year. This recommendation follows ACR consensus guidelines: White Paper of the ACR Incidental Findings Committee II on Vascular Findings. J Am Coll Radiol 2013; 10:789-794.   Original Report Authenticated By: Earle Gell, M.D. Other Problems  Back Pain Chronic Obstructive Lung Disease Diabetes Mellitus High blood pressure Inguinal Hernia  Past Surgical History  Colon Polyp Removal - Colonoscopy Spinal Surgery - Neck  Diagnostic Studies History Colonoscopy 1-5 years ago Mammogram 1-3 years ago Pap Smear never  Allergies  No Known Drug Allergies 04/02/2014  Medication History  Fosamax (70MG  Tablet, Oral) Active. Vitamin C (1000MG  Tablet, Oral) Active. B Complex (Oral) Active. Biotin-D Active. Calcium-Vitamin D (600MG  Tablet Chewable, Oral) Active. Cetirizine HCl (10MG  Tablet, Oral) Active. Benadryl (25MG  Tablet, Oral) Active. Hydrochlorothiazide (25MG  Tablet, Oral) Active. Lisinopril (5MG  Tablet, Oral) Active. MetFORMIN HCl (500MG  Tablet, Oral) Active. Multi Vitamin Daily (Oral) Active. Omega 3 (1000MG  Capsule, Oral) Active. Pravastatin Sodium (20MG  Tablet, Oral) Active. TiZANidine HCl (2MG  Capsule, Oral) Active. TraMADol HCl (50MG  Tablet, Oral) Active.  Social History  Alcohol use Remotely quit alcohol use. Caffeine use Carbonated beverages, Coffee. Illicit drug use Remotely quit drug use. Tobacco use Current every day smoker.  Family History  Alcohol Abuse Father, Mother. Arthritis Sister. Depression Mother. Diabetes Mellitus Father, Mother. Heart Disease Father. Hypertension Father. Migraine Headache  Sister. Seizure disorder Brother.  Pregnancy / Birth History  Age at menarche 23 years. Age  of menopause [age] Contraceptive History Depo-provera, Oral contraceptives. Gravida 1 Irregular periods Maternal age 44-25 Para 1     Review of Systems General Not Present- Appetite Loss, Chills, Fatigue, Fever, Night Sweats, Weight Gain and Weight Loss. Skin Not Present- Change in Wart/Mole, Dryness, Hives, Jaundice, New Lesions, Non-Healing Wounds, Rash and Ulcer. HEENT Present- Seasonal Allergies and Wears glasses/contact lenses. Not Present- Earache, Hearing Loss, Hoarseness, Nose Bleed, Oral Ulcers, Ringing in the Ears, Sinus Pain, Sore Throat, Visual Disturbances and Yellow Eyes. Respiratory Not Present- Bloody sputum, Chronic Cough, Difficulty Breathing, Snoring and Wheezing. Breast Not Present- Breast Mass, Breast Pain, Nipple Discharge and Skin Changes. Cardiovascular Present- Leg Cramps. Not Present- Chest Pain, Difficulty Breathing Lying Down, Palpitations, Rapid Heart Rate, Shortness of Breath and Swelling of Extremities. Gastrointestinal Present- Constipation. Not Present- Abdominal Pain, Bloating, Bloody Stool, Change in Bowel Habits, Chronic diarrhea, Difficulty Swallowing, Excessive gas, Gets full quickly at meals, Hemorrhoids, Indigestion, Nausea, Rectal Pain and Vomiting. Female Genitourinary Present- Urgency. Not Present- Frequency, Nocturia, Painful Urination and Pelvic Pain. Musculoskeletal Present- Back Pain. Not Present- Joint Pain, Joint Stiffness, Muscle Pain, Muscle Weakness and Swelling of Extremities. Neurological Present- Numbness. Not Present- Decreased Memory, Fainting, Headaches, Seizures, Tingling, Tremor, Trouble walking and Weakness. Hematology Present- Easy Bruising. Not Present- Excessive bleeding, Gland problems, HIV and Persistent Infections.  Vitals  04/02/2014 3:07 PM Weight: 108.13 lb Height: 57in Body Surface Area: 1.4 m Body Mass Index:  23.4 kg/m Temp.: 98.3F(Oral)  Pulse: 72 (Regular)  BP: 126/74 (Sitting, Left Arm, Standard)     Physical Exam   The physical exam findings are as follows: Note:WDWN in NAD HEENT: EOMI, sclera anicteric Neck: No masses, no thyromegaly Lungs: CTA bilaterally; normal respiratory effort CV: Regular rate and rhythm; no murmurs Abd: +bowel sounds, soft, mildly tender in LLQ over palpable bulge The bulge is reducible when she is supine. Ext: Well-perfused; no edema Skin: Warm, dry; no sign of jaundice    Assessment & Plan   SPIGELIAN HERNIA (553.29  K43.9)  Current Plans Schedule for Surgery - Laparoscopic repair of left Spigelian hernia. The surgical procedure has been discussed with the patient. Potential risks, benefits, alternative treatments, and expected outcomes have been explained. All of the patient's questions at this time have been answered. The likelihood of reaching the patient's treatment goal is good. The patient understand the proposed surgical procedure and wishes to proceed. She understands that she is at higher risk for perioperative complications because of her continued tobacco abuse.  Imogene Burn. Georgette Dover, MD, [facility]  General/ Trauma Surgery  05/14/2014 7:22 AM

## 2014-05-15 DIAGNOSIS — K4090 Unilateral inguinal hernia, without obstruction or gangrene, not specified as recurrent: Secondary | ICD-10-CM | POA: Diagnosis not present

## 2014-05-15 LAB — BASIC METABOLIC PANEL
Anion gap: 10 (ref 5–15)
BUN: 10 mg/dL (ref 6–23)
CHLORIDE: 105 mmol/L (ref 96–112)
CO2: 25 mmol/L (ref 19–32)
Calcium: 9 mg/dL (ref 8.4–10.5)
Creatinine, Ser: 0.62 mg/dL (ref 0.50–1.10)
Glucose, Bld: 99 mg/dL (ref 70–99)
Potassium: 4.2 mmol/L (ref 3.5–5.1)
Sodium: 140 mmol/L (ref 135–145)

## 2014-05-15 LAB — CBC
HCT: 37.9 % (ref 36.0–46.0)
Hemoglobin: 12.6 g/dL (ref 12.0–15.0)
MCH: 31.8 pg (ref 26.0–34.0)
MCHC: 33.2 g/dL (ref 30.0–36.0)
MCV: 95.7 fL (ref 78.0–100.0)
Platelets: 178 10*3/uL (ref 150–400)
RBC: 3.96 MIL/uL (ref 3.87–5.11)
RDW: 14.4 % (ref 11.5–15.5)
WBC: 12.9 10*3/uL — AB (ref 4.0–10.5)

## 2014-05-15 MED ORDER — OXYCODONE-ACETAMINOPHEN 5-325 MG PO TABS: 1.0000 | ORAL_TABLET | ORAL | Status: DC | PRN

## 2014-05-15 NOTE — Progress Notes (Signed)
Stevan Born to be D/C'd Home per MD order.  Discussed with the patient and all questions fully answered.  VSS, Surgical incision sites clean, dry, intact with dressings in place.  IV catheter discontinued intact. Site without signs and symptoms of complications. Dressing and pressure applied.  An After Visit Summary was printed and given to the patient. Patient received prescription.  D/c education completed with patient/family including follow up instructions, medication list, d/c activities limitations if indicated, with other d/c instructions as indicated by MD - patient able to verbalize understanding, all questions fully answered.   Patient instructed to return to ED, call 911, or call MD for any changes in condition.   Patient escorted via [facility], and D/C home via private auto.  Micki Riley 05/15/2014 11:08 AM

## 2014-05-15 NOTE — Discharge Summary (Signed)
Physician Discharge Summary  Patient ID: Kylie Hurst MRN: [id number] DOB/AGE: 1945/05/10 68 y.o.  Admit date: 05/14/2014 Discharge date: 05/15/2014  Admission Diagnoses:  Left lower quadrant spigelian hernia  Discharge Diagnoses: same Active Problems:   Spigelian hernia   Discharged Condition: good  Hospital Course: Laparoscopic-assisted repair of left lower quadrant spigelian hernia on 05/14/14.  She did well overnight and is ready for discharge.  Pain seems to be controlled with PO pain meds  Consults: None  Significant Diagnostic Studies: none  Treatments: surgery: as above  Discharge Exam: Blood pressure 117/62, pulse 78, temperature 98.8 F (37.1 C), temperature source Oral, resp. rate 16, height 4\' 9"  (1.448 m), weight 49.896 kg (110 lb), SpO2 92 %. General appearance: alert, cooperative and no distress GI: soft, incisional tenderness in left groin; port sites on right minimally tender incisions c/d/i  Disposition: 01-Home or Self Care  Discharge Instructions    Call MD for:  persistant nausea and vomiting    Complete by:  As directed      Call MD for:  redness, tenderness, or signs of infection (pain, swelling, redness, odor or green/yellow discharge around incision site)    Complete by:  As directed      Call MD for:  severe uncontrolled pain    Complete by:  As directed      Call MD for:  temperature >100.4    Complete by:  As directed      Diet general    Complete by:  As directed      Driving Restrictions    Complete by:  As directed   Do not drive while taking pain medications     Increase activity slowly    Complete by:  As directed      May shower / Bathe    Complete by:  As directed      May walk up steps    Complete by:  As directed             Medication List    TAKE these medications        alendronate 70 MG tablet  Commonly known as:  FOSAMAX  Take 70 mg by mouth every Saturday. Take with a full glass of water on an empty stomach.      b complex vitamins tablet  Take 1 tablet by mouth daily.     BIOTIN PO  Take 500 mg by mouth daily.     Calcium Carbonate-Vitamin D 600-400 MG-UNIT per tablet  Take 1 tablet by mouth 2 (two) times daily.     cetirizine 10 MG tablet  Commonly known as:  ZYRTEC  Take 10 mg by mouth at bedtime.     cyclobenzaprine 7.5 MG tablet  Commonly known as:  FEXMID  Take 1 tablet (7.5 mg total) by mouth 3 (three) times daily as needed for muscle spasms.     diphenhydrAMINE 25 MG tablet  Commonly known as:  BENADRYL  Take 1 tablet (25 mg total) by mouth every 6 (six) hours.     Fish Oil 1000 MG Caps  Take by mouth. Fish oil 1000mg /300mg  Omega 3     hydrochlorothiazide 25 MG tablet  Commonly known as:  HYDRODIURIL  Take 12.5 mg by mouth daily.     hydrOXYzine 10 MG tablet  Commonly known as:  ATARAX/VISTARIL  Take 1 tablet (10 mg total) by mouth 3 (three) times daily as needed for itching or anxiety.     lisinopril 5 MG  tablet  Commonly known as:  PRINIVIL,ZESTRIL  Take 5 mg by mouth at bedtime.     metFORMIN 500 MG tablet  Commonly known as:  GLUCOPHAGE  Take 500 mg by mouth daily with breakfast.     multivitamin with minerals tablet  Take 1 tablet by mouth every evening.     oxyCODONE-acetaminophen 5-325 MG per tablet  Commonly known as:  PERCOCET/ROXICET  Take 1-2 tablets by mouth every 4 (four) hours as needed for moderate pain.     pravastatin 20 MG tablet  Commonly known as:  PRAVACHOL  Take 20 mg by mouth at bedtime.     predniSONE 20 MG tablet  Commonly known as:  DELTASONE  3 tabs po day one, then 2 tabs daily x 4 days     pyridOXINE 100 MG tablet  Commonly known as:  VITAMIN B-6  Take 100 mg by mouth at bedtime.     tiZANidine 2 MG tablet  Commonly known as:  ZANAFLEX  Take 1 tablet (2 mg total) by mouth 2 (two) times daily.     traMADol 50 MG tablet  Commonly known as:  ULTRAM  Take 1 tablet (50 mg total) by mouth every 12 (twelve) hours as needed. May  take additional table on days pain is severe, no more than 3 per day     vitamin C 1000 MG tablet  Take 1,000 mg by mouth 2 (two) times daily. Patient takes one tablet (500 mg) daily during Spring, Summer, and Fall. Patient takes one tablet (500 mg) twice daily in the Winter.           Follow-up Information    Follow up with Charidy Cappelletti K., MD. Schedule an appointment as soon as possible for a visit in 3 weeks.   Specialty:  General Surgery   Contact information:   [address] [place] [address] [phone]       Signed: Maia Petties. 05/15/2014, 8:19 AM

## 2014-05-15 NOTE — Discharge Instructions (Signed)
[facility], [place]  HERNIA REPAIR: POST OP INSTRUCTIONS  Always review your discharge instruction sheet given to you by the facility where your surgery was performed. IF YOU HAVE DISABILITY OR FAMILY LEAVE FORMS, YOU MUST BRING THEM TO THE OFFICE FOR PROCESSING.   DO NOT GIVE THEM TO YOUR DOCTOR.  1. A  prescription for pain medication may be given to you upon discharge.  Take your pain medication as prescribed, if needed.  If narcotic pain medicine is not needed, then you may take acetaminophen (Tylenol) or ibuprofen (Advil) as needed. 2. Take your usually prescribed medications unless otherwise directed. 3. If you need a refill on your pain medication, please contact your pharmacy.  They will contact our office to request authorization. Prescriptions will not be filled after 5 pm or on week-ends. 4. You should follow a light diet the first 24 hours after arrival home, such as soup and crackers, etc.  Be sure to include lots of fluids daily.  Resume your normal diet the day after surgery. 5. Most patients will experience some swelling and bruising around the umbilicus or in the groin and scrotum.  Ice packs and reclining will help.  Swelling and bruising can take several days to resolve.  6. It is common to experience some constipation if taking pain medication after surgery.  Increasing fluid intake and taking a stool softener (such as Colace) will usually help or prevent this problem from occurring.  A mild laxative (Milk of Magnesia or Miralax) should be taken according to package directions if there are no bowel movements after 48 hours. 7. Unless discharge instructions indicate otherwise, you may remove your bandages 24-48 hours after surgery, and you may shower at that time.  You will have steri-strips (small skin tapes) in place directly over the incision.  These strips should be left on the skin for 7-10 days. 8. ACTIVITIES:  You may resume regular (light) daily activities beginning  the next day--such as daily self-care, walking, climbing stairs--gradually increasing activities as tolerated.  You may have sexual intercourse when it is comfortable.  Refrain from any heavy lifting or straining until approved by your doctor. a. You may drive when you are no longer taking prescription pain medication, you can comfortably wear a seatbelt, and you can safely maneuver your car and apply brakes. b. RETURN TO WORK:  2-3 weeks with light duty - no lifting over 15 lbs. 9. You should see your doctor in the office for a follow-up appointment approximately 2-3 weeks after your surgery.  Make sure that you call for this appointment within a day or two after you arrive home to insure a convenient appointment time. 10. OTHER INSTRUCTIONS:  __________________________________________________________________________________________________________________________________________________________________________________________  WHEN TO CALL YOUR DOCTOR: 1. Fever over 101.0 2. Inability to urinate 3. Nausea and/or vomiting 4. Extreme swelling or bruising 5. Continued bleeding from incision. 6. Increased pain, redness, or drainage from the incision  The clinic staff is available to answer your questions during regular business hours.  Please dont hesitate to call and ask to speak to one of the nurses for clinical concerns.  If you have a medical emergency, go to the nearest emergency room or call 911.  A surgeon from [facility] is always on call at the hospital   [address], [place], [place], [place]  [address] ?  [address], [place], [place]   [address] [phone]    FAX [phone] Web site: [email or web address]

## 2014-05-18 ENCOUNTER — Encounter (HOSPITAL_COMMUNITY): Payer: Self-pay | Admitting: Surgery

## 2014-07-03 ENCOUNTER — Ambulatory Visit (HOSPITAL_BASED_OUTPATIENT_CLINIC_OR_DEPARTMENT_OTHER): Payer: Medicare Other | Admitting: Physical Medicine & Rehabilitation

## 2014-07-03 ENCOUNTER — Encounter: Payer: Self-pay | Admitting: Physical Medicine & Rehabilitation

## 2014-07-03 ENCOUNTER — Encounter: Payer: Medicare Other | Attending: Physical Medicine & Rehabilitation

## 2014-07-03 VITALS — BP 104/66 | HR 70 | Resp 14

## 2014-07-03 DIAGNOSIS — M47812 Spondylosis without myelopathy or radiculopathy, cervical region: Secondary | ICD-10-CM | POA: Insufficient documentation

## 2014-07-03 DIAGNOSIS — Z720 Tobacco use: Secondary | ICD-10-CM | POA: Diagnosis not present

## 2014-07-03 DIAGNOSIS — M5416 Radiculopathy, lumbar region: Secondary | ICD-10-CM | POA: Insufficient documentation

## 2014-07-03 DIAGNOSIS — E1142 Type 2 diabetes mellitus with diabetic polyneuropathy: Secondary | ICD-10-CM

## 2014-07-03 DIAGNOSIS — M4317 Spondylolisthesis, lumbosacral region: Secondary | ICD-10-CM

## 2014-07-03 MED ORDER — ASPIRIN 81 MG PO CHEW: 81.0000 mg | CHEWABLE_TABLET | Freq: Every day | ORAL | Status: AC

## 2014-07-03 NOTE — Progress Notes (Signed)
Subjective:    Patient ID: Kylie Hurst, female    DOB: December 25, 1945, 69 y.o.   MRN: [id number] The patient is a 69 year old female, history of L5-S1 spondylolisthesis with chronic low back pain.  Also history of cervical radiculopathy and underwent C7-T1 ACDF in November of 2014.  Last PA visit was prior to surgery  Interval history obtained from patient.  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 General activity 1 Relation with others 0 Enjoyment of life 1 What TIME of day is your pain at its worst? evening Sleep (in general) Fair  Pain is worse with: sitting and some activites Pain improves with: rest Relief from Meds: 8  Mobility walk without assistance ability to climb steps?  yes do you drive?  yes Do you have any goals in this area?  yes  Function retired Do you have any goals in this area?  yes  Neuro/Psych numbness spasms loss of taste or smell  Prior Studies Any changes since last visit?  no  Physicians involved in your care Any changes since last visit?  no   Family History  Problem Relation Age of Onset  . Diabetes Mother   . Cancer Sister   . Epilepsy Brother    History   Social History  . Marital Status: Divorced    Spouse Name: N/A  . Number of Children: N/A  . Years of Education: N/A   Social History Main Topics  . Smoking status: Current Every Day Smoker -- 1.00 packs/day for 47 years    Types: Cigarettes  . Smokeless tobacco:  Never Used  . Alcohol Use: No  . Drug Use: No  . Sexual Activity: Not on file   Other Topics Concern  . None   Social History Narrative   Past Surgical History  Procedure Laterality Date  . Colonoscopy    . Anterior cervical decomp/discectomy fusion N/A 12/18/2012    Procedure: ANTERIOR CERVICAL DECOMPRESSION/DISCECTOMY FUSION 1 LEVEL;  Surgeon: Sinclair Ship, MD;  Location: [facility];  Service: Orthopedics;  Laterality: N/A;  Anterior cervical decompression fusion, cervical 7 - thoracic 1 with instrumentation, allograft  . Neck surgery    . Spigelian hernia  05/14/2014  . Ventral hernia repair Left 05/14/2014    Procedure: ATTEMPTED LAPAROSCOPIC REPAIR LEFT SPIGLIAN HERINA CONVERTED TO OPEN REPAIR;  Surgeon: Donnie Mesa, MD;  Location: [facility];  Service: General;  Laterality: Left;  . Insertion of mesh Left 05/14/2014    Procedure: INSERTION OF MESH;  Surgeon: Donnie Mesa, MD;  Location: [facility];  Service: General;  Laterality: Left;   Past Medical History  Diagnosis Date  . Hypertension   . Aortic aneurysm 2011    3.73mm  . Ovarian cyst 06/01/2009    5 cm  . Onycholysis of toenail   . Osteoporosis   . Chronic back pain   . Restless legs   . Back spasm   . Emphysema   .  Bulging discs   . Depression   . Diabetes mellitus without complication     fasting 90-110  . Arthritis   . Neuromuscular disorder   . Hyperlipidemia   . Environmental and seasonal allergies   . Spigelian hernia     left ventral  . Wears glasses    BP 104/66 mmHg  Pulse 70  Resp 14  SpO2 97%  Opioid Risk Score:   Fall Risk Score: Moderate Fall Risk (6-13 points)`1  Depression screen PHQ 2/9  No flowsheet data found.   Review of Systems  Constitutional: Negative.        Loss of taste/smell  Eyes: Negative.   Respiratory: Negative.   Gastrointestinal: Positive for constipation.  Endocrine: Negative.        High blood sugar   Genitourinary: Negative.   Musculoskeletal: Negative.     Allergic/Immunologic: Negative.   Neurological: Positive for numbness.       Spasms   Hematological: Negative.   All other systems reviewed and are negative.      Objective:   Physical Exam  Constitutional: She is oriented to person, place, and time. She appears well-developed and well-nourished.  HENT:  Head: Normocephalic.  Neurological: She is alert and oriented to person, place, and time. She has normal strength.  Reflex Scores:      Tricep reflexes are 2+ on the right side and 2+ on the left side.      Bicep reflexes are 2+ on the right side and 2+ on the left side.      Brachioradialis reflexes are 2+ on the right side and 2+ on the left side.      Patellar reflexes are 2+ on the right side and 2+ on the left side.      Achilles reflexes are 2+ on the right side and 2+ on the left side. 5/5 strength in bilateral   Psychiatric: She has a normal mood and affect.  Nursing note and vitals reviewed.  Trapezius left side tenderness  Ecchymosis Left prepatellar area Sensation Diminished pp bilateral  S1     Assessment & Plan:  1.  Cervical post laminectomy s/p C7-T1 fusion, no radiculopathy  Cont Tramadol 50mg  BID, rarely TID  takes tramadol with Tylenol 500mg  BID Follow-up with nurse practitioner in 6 months.  2.  Lumbar stenosis with chronic R L5-S1 radiculopathy, no severe neuropathic pain  3.  DM with neuropathy, diabetic control is good, no severe neuro pathic pain,She gets some restless leg symptoms but these do not sound like neuropathic pain. I gave her information regarding gabapentin. She will read this and decide whether or not she wants to try this.  Over half of the 25 min visit was spent counseling and coordinating care.

## 2014-07-03 NOTE — Patient Instructions (Signed)
Gabapentin capsules or tablets What is this medicine? GABAPENTIN (GA ba pen tin) is used to control partial seizures in adults with epilepsy. It is also used to treat certain types of nerve pain. This medicine may be used for other purposes; ask your health care provider or pharmacist if you have questions. COMMON BRAND NAME(S): Gabarone, Neurontin What should I tell my health care provider before I take this medicine? They need to know if you have any of these conditions: -kidney disease -suicidal thoughts, plans, or attempt; a previous suicide attempt by you or a family member -an unusual or allergic reaction to gabapentin, other medicines, foods, dyes, or preservatives -pregnant or trying to get pregnant -breast-feeding How should I use this medicine? Take this medicine by mouth with a glass of water. Follow the directions on the prescription label. You can take it with or without food. If it upsets your stomach, take it with food.Take your medicine at regular intervals. Do not take it more often than directed. Do not stop taking except on your doctor's advice. If you are directed to break the 600 or 800 mg tablets in half as part of your dose, the extra half tablet should be used for the next dose. If you have not used the extra half tablet within 28 days, it should be thrown away. A special MedGuide will be given to you by the pharmacist with each prescription and refill. Be sure to read this information carefully each time. Talk to your pediatrician regarding the use of this medicine in children. Special care may be needed. Overdosage: If you think you have taken too much of this medicine contact a poison control center or emergency room at once. NOTE: This medicine is only for you. Do not share this medicine with others. What if I miss a dose? If you miss a dose, take it as soon as you can. If it is almost time for your next dose, take only that dose. Do not take double or extra  doses. What may interact with this medicine? Do not take this medicine with any of the following medications: -other gabapentin products This medicine may also interact with the following medications: -alcohol -antacids -antihistamines for allergy, cough and cold -certain medicines for anxiety or sleep -certain medicines for depression or psychotic disturbances -homatropine; hydrocodone -naproxen -narcotic medicines (opiates) for pain -phenothiazines like chlorpromazine, mesoridazine, prochlorperazine, thioridazine This list may not describe all possible interactions. Give your health care provider a list of all the medicines, herbs, non-prescription drugs, or dietary supplements you use. Also tell them if you smoke, drink alcohol, or use illegal drugs. Some items may interact with your medicine. What should I watch for while using this medicine? Visit your doctor or health care professional for regular checks on your progress. You may want to keep a record at home of how you feel your condition is responding to treatment. You may want to share this information with your doctor or health care professional at each visit. You should contact your doctor or health care professional if your seizures get worse or if you have any new types of seizures. Do not stop taking this medicine or any of your seizure medicines unless instructed by your doctor or health care professional. Stopping your medicine suddenly can increase your seizures or their severity. Wear a medical identification bracelet or chain if you are taking this medicine for seizures, and carry a card that lists all your medications. You may get drowsy, dizzy, or have blurred   vision. Do not drive, use machinery, or do anything that needs mental alertness until you know how this medicine affects you. To reduce dizzy or fainting spells, do not sit or stand up quickly, especially if you are an older patient. Alcohol can increase drowsiness and  dizziness. Avoid alcoholic drinks. Your mouth may get dry. Chewing sugarless gum or sucking hard candy, and drinking plenty of water will help. The use of this medicine may increase the chance of suicidal thoughts or actions. Pay special attention to how you are responding while on this medicine. Any worsening of mood, or thoughts of suicide or dying should be reported to your health care professional right away. Women who become pregnant while using this medicine may enroll in the [place] American Antiepileptic Drug Pregnancy Registry by calling 1-888-233-2334. This registry collects information about the safety of antiepileptic drug use during pregnancy. What side effects may I notice from receiving this medicine? Side effects that you should report to your doctor or health care professional as soon as possible: -allergic reactions like skin rash, itching or hives, swelling of the face, lips, or tongue -worsening of mood, thoughts or actions of suicide or dying Side effects that usually do not require medical attention (report to your doctor or health care professional if they continue or are bothersome): -constipation -difficulty walking or controlling muscle movements -dizziness -nausea -slurred speech -tiredness -tremors -weight gain This list may not describe all possible side effects. Call your doctor for medical advice about side effects. You may report side effects to FDA at 1-800-FDA-1088. Where should I keep my medicine? Keep out of reach of children. Store at room temperature between 15 and 30 degrees C (59 and 86 degrees F). Throw away any unused medicine after the expiration date. NOTE: This sheet is a summary. It may not cover all possible information. If you have questions about this medicine, talk to your doctor, pharmacist, or health care provider.  2015, Elsevier/Gold Standard. (2012-09-19 09:12:48)  

## 2014-07-13 ENCOUNTER — Other Ambulatory Visit: Payer: Self-pay | Admitting: Physical Medicine & Rehabilitation

## 2014-07-14 ENCOUNTER — Other Ambulatory Visit (HOSPITAL_COMMUNITY): Payer: Self-pay

## 2014-07-14 ENCOUNTER — Ambulatory Visit: Payer: Medicaid Other | Admitting: Vascular Surgery

## 2014-07-14 ENCOUNTER — Encounter (HOSPITAL_COMMUNITY): Payer: Medicare Other

## 2014-07-14 ENCOUNTER — Ambulatory Visit: Payer: Medicare Other | Admitting: Vascular Surgery

## 2014-08-11 ENCOUNTER — Other Ambulatory Visit: Payer: Self-pay | Admitting: Physical Medicine & Rehabilitation

## 2014-09-07 ENCOUNTER — Other Ambulatory Visit: Payer: Self-pay | Admitting: Physical Medicine & Rehabilitation

## 2014-09-16 ENCOUNTER — Other Ambulatory Visit: Payer: Self-pay | Admitting: *Deleted

## 2014-09-16 MED ORDER — TRAMADOL HCL 50 MG PO TABS: 50.0000 mg | ORAL_TABLET | Freq: Two times a day (BID) | ORAL | Status: DC | PRN

## 2014-10-06 ENCOUNTER — Other Ambulatory Visit: Payer: Self-pay | Admitting: Physical Medicine & Rehabilitation

## 2014-10-20 ENCOUNTER — Other Ambulatory Visit: Payer: Self-pay | Admitting: Gastroenterology

## 2014-11-10 ENCOUNTER — Other Ambulatory Visit: Payer: Self-pay | Admitting: Physical Medicine & Rehabilitation

## 2014-12-31 ENCOUNTER — Encounter: Payer: Medicare Other | Attending: Registered Nurse | Admitting: Registered Nurse

## 2014-12-31 ENCOUNTER — Encounter: Payer: Self-pay | Admitting: Registered Nurse

## 2014-12-31 VITALS — BP 106/74 | HR 76

## 2014-12-31 DIAGNOSIS — I10 Essential (primary) hypertension: Secondary | ICD-10-CM | POA: Insufficient documentation

## 2014-12-31 DIAGNOSIS — M25511 Pain in right shoulder: Secondary | ICD-10-CM | POA: Diagnosis not present

## 2014-12-31 DIAGNOSIS — M79671 Pain in right foot: Secondary | ICD-10-CM | POA: Diagnosis not present

## 2014-12-31 DIAGNOSIS — F17200 Nicotine dependence, unspecified, uncomplicated: Secondary | ICD-10-CM | POA: Diagnosis not present

## 2014-12-31 DIAGNOSIS — M25512 Pain in left shoulder: Secondary | ICD-10-CM | POA: Insufficient documentation

## 2014-12-31 DIAGNOSIS — E119 Type 2 diabetes mellitus without complications: Secondary | ICD-10-CM | POA: Insufficient documentation

## 2014-12-31 DIAGNOSIS — M79672 Pain in left foot: Secondary | ICD-10-CM | POA: Insufficient documentation

## 2014-12-31 DIAGNOSIS — J439 Emphysema, unspecified: Secondary | ICD-10-CM | POA: Diagnosis not present

## 2014-12-31 DIAGNOSIS — E1142 Type 2 diabetes mellitus with diabetic polyneuropathy: Secondary | ICD-10-CM

## 2014-12-31 DIAGNOSIS — Z981 Arthrodesis status: Secondary | ICD-10-CM | POA: Insufficient documentation

## 2014-12-31 DIAGNOSIS — M4317 Spondylolisthesis, lumbosacral region: Secondary | ICD-10-CM

## 2014-12-31 DIAGNOSIS — M25552 Pain in left hip: Secondary | ICD-10-CM

## 2014-12-31 DIAGNOSIS — G2581 Restless legs syndrome: Secondary | ICD-10-CM | POA: Diagnosis not present

## 2014-12-31 DIAGNOSIS — M545 Low back pain: Secondary | ICD-10-CM | POA: Insufficient documentation

## 2014-12-31 DIAGNOSIS — E785 Hyperlipidemia, unspecified: Secondary | ICD-10-CM | POA: Diagnosis not present

## 2014-12-31 DIAGNOSIS — F329 Major depressive disorder, single episode, unspecified: Secondary | ICD-10-CM | POA: Diagnosis not present

## 2014-12-31 DIAGNOSIS — G8929 Other chronic pain: Secondary | ICD-10-CM | POA: Insufficient documentation

## 2014-12-31 MED ORDER — TIZANIDINE HCL 2 MG PO TABS: 2.0000 mg | ORAL_TABLET | Freq: Two times a day (BID) | ORAL | Status: DC

## 2014-12-31 MED ORDER — TRAMADOL HCL 50 MG PO TABS: 50.0000 mg | ORAL_TABLET | Freq: Two times a day (BID) | ORAL | Status: DC | PRN

## 2014-12-31 NOTE — Progress Notes (Signed)
Subjective:    Patient ID: Kylie Hurst, female    DOB: [birth year], 69 y.o.   MRN: [id number]  HPI: Ms. Kylie Hurst is a 69 year old female who returns for follow up for chronic pain and medication refill. She says her pain is located in her bilateral shoulder's, lower back, left hip and bilateral feet. She rates 5. She rates her pain 5. Her current exercise regime is walking.  Pain Inventory Average Pain 7 Pain Right Now 5 My pain is tingling  In the last 24 hours, has pain interfered with the following? General activity 2 Relation with others 0 Enjoyment of life 2 What TIME of day is your pain at its worst? night Sleep (in general) Fair  Pain is worse with: sitting and inactivity Pain improves with: rest and medication Relief from Meds: 8  Mobility walk without assistance how many minutes can you walk? 60 ability to climb steps?  yes do you drive?  yes Do you have any goals in this area?  yes  Function retired  Neuro/Psych numbness tingling spasms  Prior Studies Any changes since last visit?  no  Physicians involved in your care Any changes since last visit?  no   Family History  Problem Relation Age of Onset  . Diabetes Mother   . Cancer Sister   . Epilepsy Brother    Social History   Social History  . Marital Status: Divorced    Spouse Name: N/A  . Number of Children: N/A  . Years of Education: N/A   Social History Main Topics  . Smoking status: Current Every Day Smoker -- 1.00 packs/day for 47 years    Types: Cigarettes  . Smokeless tobacco: Never Used  . Alcohol Use: No  . Drug Use: No  . Sexual Activity: Not Asked   Other Topics Concern  . None   Social History Narrative   Past Surgical History  Procedure Laterality Date  . Colonoscopy    . Anterior cervical decomp/discectomy fusion N/A 12/18/2012    Procedure: ANTERIOR CERVICAL DECOMPRESSION/DISCECTOMY FUSION 1 LEVEL;  Surgeon: Sinclair Ship, MD;  Location: [facility];   Service: Orthopedics;  Laterality: N/A;  Anterior cervical decompression fusion, cervical 7 - thoracic 1 with instrumentation, allograft  . Neck surgery    . Spigelian hernia  05/14/2014  . Ventral hernia repair Left 05/14/2014    Procedure: ATTEMPTED LAPAROSCOPIC REPAIR LEFT SPIGLIAN HERINA CONVERTED TO OPEN REPAIR;  Surgeon: Donnie Mesa, MD;  Location: [facility];  Service: General;  Laterality: Left;  . Insertion of mesh Left 05/14/2014    Procedure: INSERTION OF MESH;  Surgeon: Donnie Mesa, MD;  Location: [facility];  Service: General;  Laterality: Left;   Past Medical History  Diagnosis Date  . Hypertension   . Aortic aneurysm ([place]) 2011    3.74mm  . Ovarian cyst 06/01/2009    5 cm  . Onycholysis of toenail   . Osteoporosis   . Chronic back pain   . Restless legs   . Back spasm   . Emphysema   . Bulging discs   . Depression   . Diabetes mellitus without complication (HCC)     fasting 90-110  . Arthritis   . Neuromuscular disorder ([place])   . Hyperlipidemia   . Environmental and seasonal allergies   . Spigelian hernia     left ventral  . Wears glasses    BP 106/74 mmHg  Pulse 76  SpO2 95%  Opioid Risk Score:  Fall Risk Score:  `1  Depression screen PHQ 2/9  No flowsheet data found.   Review of Systems  Genitourinary:       Urine retention  Musculoskeletal:       Spasms  Neurological: Positive for numbness.       Tingling  All other systems reviewed and are negative.      Objective:   Physical Exam  Constitutional: She is oriented to person, place, and time. She appears well-developed and well-nourished.  HENT:  Head: Normocephalic and atraumatic.  Neck: Normal range of motion. Neck supple.  Cardiovascular: Normal rate and regular rhythm.   Pulmonary/Chest: Effort normal and breath sounds normal.  Musculoskeletal:  Normal Muscle Bulk and Muscle Testing Reveals: Upper Extremities: Full ROM and Muscle Strength 5/5 Back without spinal or paraspinal  tenderness Left Greater Trochanteric Tenderness Lower Extremities: Full ROM and Muscle Strength 5/5 Arises from chair with ease Narrow Based Gait    Neurological: She is alert and oriented to person, place, and time.  Skin: Skin is warm and dry.  Psychiatric: She has a normal mood and affect.  Nursing note and vitals reviewed.         Assessment & Plan:  1. 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 Radiculopathies at L5-S1. Refilled: Tramadol 50 mg one tablet every 12 hours as needed. May take an extra tablet when pain is severe no more than three a day. #80. Tramadol prescription postdated. 2. Diabetic peripheral neuropathy causing bilateral foot numbness. No complaints today. Continue Tight Glucose Control. Continue to Monitor.  20 minutes of face to face patient care time was spent during this visit. All questions were encouraged and answered.   F/U in 6 months

## 2015-01-01 ENCOUNTER — Ambulatory Visit: Payer: Medicare Other | Admitting: Registered Nurse

## 2015-02-27 ENCOUNTER — Other Ambulatory Visit: Payer: Self-pay | Admitting: Physical Medicine & Rehabilitation

## 2015-07-01 ENCOUNTER — Ambulatory Visit: Payer: Medicare Other | Admitting: Physical Medicine & Rehabilitation

## 2015-07-05 ENCOUNTER — Encounter: Payer: Self-pay | Admitting: Physical Medicine & Rehabilitation

## 2015-07-05 ENCOUNTER — Encounter: Payer: Medicare Other | Attending: Physical Medicine & Rehabilitation

## 2015-07-05 ENCOUNTER — Ambulatory Visit (HOSPITAL_BASED_OUTPATIENT_CLINIC_OR_DEPARTMENT_OTHER): Payer: Medicare Other | Admitting: Physical Medicine & Rehabilitation

## 2015-07-05 VITALS — BP 99/68 | HR 71 | Resp 14

## 2015-07-05 DIAGNOSIS — K439 Ventral hernia without obstruction or gangrene: Secondary | ICD-10-CM | POA: Insufficient documentation

## 2015-07-05 DIAGNOSIS — F17210 Nicotine dependence, cigarettes, uncomplicated: Secondary | ICD-10-CM | POA: Diagnosis not present

## 2015-07-05 DIAGNOSIS — M810 Age-related osteoporosis without current pathological fracture: Secondary | ICD-10-CM | POA: Insufficient documentation

## 2015-07-05 DIAGNOSIS — I10 Essential (primary) hypertension: Secondary | ICD-10-CM | POA: Insufficient documentation

## 2015-07-05 DIAGNOSIS — E785 Hyperlipidemia, unspecified: Secondary | ICD-10-CM | POA: Insufficient documentation

## 2015-07-05 DIAGNOSIS — E1140 Type 2 diabetes mellitus with diabetic neuropathy, unspecified: Secondary | ICD-10-CM | POA: Diagnosis not present

## 2015-07-05 DIAGNOSIS — G2581 Restless legs syndrome: Secondary | ICD-10-CM | POA: Diagnosis not present

## 2015-07-05 DIAGNOSIS — M5416 Radiculopathy, lumbar region: Secondary | ICD-10-CM | POA: Insufficient documentation

## 2015-07-05 DIAGNOSIS — M4317 Spondylolisthesis, lumbosacral region: Secondary | ICD-10-CM | POA: Diagnosis present

## 2015-07-05 DIAGNOSIS — G8929 Other chronic pain: Secondary | ICD-10-CM | POA: Insufficient documentation

## 2015-07-05 DIAGNOSIS — M545 Low back pain: Secondary | ICD-10-CM | POA: Insufficient documentation

## 2015-07-05 DIAGNOSIS — J439 Emphysema, unspecified: Secondary | ICD-10-CM | POA: Insufficient documentation

## 2015-07-05 DIAGNOSIS — Z981 Arthrodesis status: Secondary | ICD-10-CM | POA: Insufficient documentation

## 2015-07-05 MED ORDER — TIZANIDINE HCL 2 MG PO TABS: 2.0000 mg | ORAL_TABLET | Freq: Two times a day (BID) | ORAL | Status: DC

## 2015-07-05 MED ORDER — TRAMADOL HCL 50 MG PO TABS: 50.0000 mg | ORAL_TABLET | Freq: Two times a day (BID) | ORAL | Status: DC | PRN

## 2015-07-05 NOTE — Patient Instructions (Addendum)
Do not recommend back brace because it can weaken in the back muscles.

## 2015-07-05 NOTE — Progress Notes (Signed)
Subjective:    Patient ID: Kylie Hurst, female    DOB: 1945-10-19, 70 y.o.   MRN: [id number] The patient is a 70 year old female, history of L5-S1 spondylolisthesis with chronic low back pain.  Also history of cervical radiculopathy and underwent C7-T1 ACDF in November of 2014.  Last PA visit was prior to surgery  Interval history obtained from patient.  Off of narcotic analgesics such as hydrocodone and oxycodone postop  HPI  Patient  Is overall doing quite well she remains independent with all her self-care and mobility. She tries to do some walking. She has a new nursing book which is actually from 2009 and had some questions about tramadol information listed in that book. We also talked about different medication pamphlets that she received in this office including Lyrica. Pain Inventory Average Pain 7 Pain Right Now 5 My pain is intermittent and tingling  In the last 24 hours, has pain interfered with the following? General activity 1 Relation with others 0 Enjoyment of life 4 What TIME of day is your pain at its worst? evening Sleep (in general) Fair  Pain is worse with: bending, sitting and some activites Pain improves with: rest and medication Relief from Meds: 8  Mobility walk without assistance ability to climb steps?  yes do you drive?  yes Do you have any goals in this area?  yes  Function retired  Neuro/Psych bladder control problems bowel control problems numbness tingling spasms  Prior Studies Any changes since last visit?  no  Physicians involved in your care Any changes since last visit?  no   Family History  Problem Relation Age of Onset  . Diabetes Mother   . Cancer Sister   . Epilepsy Brother    Social History   Social History  . Marital Status: Divorced    Spouse Name: N/A  . Number of Children: N/A  . Years of Education: N/A   Social History Main Topics  . Smoking status: Current Every Day Smoker -- 1.00 packs/day for 47  years    Types: Cigarettes  . Smokeless tobacco: Never Used  . Alcohol Use: No  . Drug Use: No  . Sexual Activity: Not Asked   Other Topics Concern  . None   Social History Narrative   Past Surgical History  Procedure Laterality Date  . Colonoscopy    . Anterior cervical decomp/discectomy fusion N/A 12/18/2012    Procedure: ANTERIOR CERVICAL DECOMPRESSION/DISCECTOMY FUSION 1 LEVEL;  Surgeon: Sinclair Ship, MD;  Location: [facility];  Service: Orthopedics;  Laterality: N/A;  Anterior cervical decompression fusion, cervical 7 - thoracic 1 with instrumentation, allograft  . Neck surgery    . Spigelian hernia  05/14/2014  . Ventral hernia repair Left 05/14/2014    Procedure: ATTEMPTED LAPAROSCOPIC REPAIR LEFT SPIGLIAN HERINA CONVERTED TO OPEN REPAIR;  Surgeon: Donnie Mesa, MD;  Location: [facility];  Service: General;  Laterality: Left;  . Insertion of mesh Left 05/14/2014    Procedure: INSERTION OF MESH;  Surgeon: Donnie Mesa, MD;  Location: [facility];  Service: General;  Laterality: Left;   Past Medical History  Diagnosis Date  . Hypertension   . Aortic aneurysm ([place]) 2011    3.60mm  . Ovarian cyst 06/01/2009    5 cm  . Onycholysis of toenail   . Osteoporosis   . Chronic back pain   . Restless legs   . Back spasm   . Emphysema   . Bulging discs   .  Depression   . Diabetes mellitus without complication (HCC)     fasting 90-110  . Arthritis   . Neuromuscular disorder ([place])   . Hyperlipidemia   . Environmental and seasonal allergies   . Spigelian hernia     left ventral  . Wears glasses    BP 99/68 mmHg  Pulse 71  Resp 14  SpO2 97%  Opioid Risk Score:   Fall Risk Score:  `1  Depression screen PHQ 2/9  Depression screen PHQ 2/9 07/05/2015  Decreased Interest 0  Down, Depressed, Hopeless 0  PHQ - 2 Score 0  Altered sleeping 0  Tired, decreased energy 0  Change in appetite 0  Feeling bad or failure about yourself  0  Trouble concentrating 0  Moving slowly or  fidgety/restless 0  Suicidal thoughts 0  PHQ-9 Score 0  Difficult doing work/chores Not difficult at all      Review of Systems     Objective:   Physical Exam  Constitutional: She is oriented to person, place, and time. She appears well-developed and well-nourished.  HENT:  Head: Normocephalic and atraumatic.  Eyes: Conjunctivae and EOM are normal. Pupils are equal, round, and reactive to light.  Musculoskeletal:       Lumbar back: She exhibits decreased range of motion.  Lumbar flexion 100% Lumbar extension 50% Lateral bending 50% to the right and to the left  Neurological: She is alert and oriented to person, place, and time.  Psychiatric: She has a normal mood and affect.  Nursing note and vitals reviewed.  Decreased sensation right L5 and S1 dermatomal distribution to pinprick       Assessment & Plan:  1.  Cervical post laminectomy s/p C7-T1 fusion, no radiculopathy  Cont Tramadol 50mg  BID, rarely TID  takes tramadol with Tylenol 500mg  BID Follow-up with nurse practitioner in 6 months.  2.  Lumbar stenosis with chronic R L5-S1 radiculopathy, no severe neuropathic pain  3.  DM with neuropathy, diabetic control is good, no severe neuro pathic pain,She gets some Paresthesias, At this point she does not want to try any new medicine for her neuropathic symptoms. We have in the past discussed gabapentin. We discussed Lyrica today.  Answered pt's questions Over half of the 25 min visit was spent counseling and coordinating care.

## 2015-12-28 ENCOUNTER — Other Ambulatory Visit: Payer: Self-pay | Admitting: Specialist

## 2015-12-28 ENCOUNTER — Other Ambulatory Visit: Payer: Self-pay | Admitting: Family Medicine

## 2015-12-28 DIAGNOSIS — Z1231 Encounter for screening mammogram for malignant neoplasm of breast: Secondary | ICD-10-CM

## 2015-12-30 ENCOUNTER — Telehealth: Payer: Self-pay | Admitting: *Deleted

## 2015-12-30 MED ORDER — TRAMADOL HCL 50 MG PO TABS: 50.0000 mg | ORAL_TABLET | Freq: Two times a day (BID) | ORAL | 0 refills | Status: DC | PRN

## 2015-12-30 NOTE — Telephone Encounter (Signed)
Needs refill for tramdol.  Has an appt 01/04/16 with [name] Refilled one month, [name] can fill with 6 month at the appt.

## 2016-01-04 ENCOUNTER — Encounter: Payer: Medicare Other | Attending: Registered Nurse | Admitting: Registered Nurse

## 2016-01-04 ENCOUNTER — Encounter: Payer: Self-pay | Admitting: Registered Nurse

## 2016-01-04 VITALS — BP 113/77 | HR 85

## 2016-01-04 DIAGNOSIS — F329 Major depressive disorder, single episode, unspecified: Secondary | ICD-10-CM | POA: Insufficient documentation

## 2016-01-04 DIAGNOSIS — J439 Emphysema, unspecified: Secondary | ICD-10-CM | POA: Diagnosis not present

## 2016-01-04 DIAGNOSIS — R200 Anesthesia of skin: Secondary | ICD-10-CM | POA: Diagnosis not present

## 2016-01-04 DIAGNOSIS — I10 Essential (primary) hypertension: Secondary | ICD-10-CM | POA: Insufficient documentation

## 2016-01-04 DIAGNOSIS — N83209 Unspecified ovarian cyst, unspecified side: Secondary | ICD-10-CM | POA: Insufficient documentation

## 2016-01-04 DIAGNOSIS — Z9889 Other specified postprocedural states: Secondary | ICD-10-CM | POA: Insufficient documentation

## 2016-01-04 DIAGNOSIS — M4316 Spondylolisthesis, lumbar region: Secondary | ICD-10-CM | POA: Diagnosis present

## 2016-01-04 DIAGNOSIS — E119 Type 2 diabetes mellitus without complications: Secondary | ICD-10-CM | POA: Insufficient documentation

## 2016-01-04 DIAGNOSIS — F17210 Nicotine dependence, cigarettes, uncomplicated: Secondary | ICD-10-CM | POA: Insufficient documentation

## 2016-01-04 DIAGNOSIS — G2581 Restless legs syndrome: Secondary | ICD-10-CM | POA: Diagnosis not present

## 2016-01-04 DIAGNOSIS — E785 Hyperlipidemia, unspecified: Secondary | ICD-10-CM | POA: Insufficient documentation

## 2016-01-04 DIAGNOSIS — G8929 Other chronic pain: Secondary | ICD-10-CM | POA: Diagnosis not present

## 2016-01-04 DIAGNOSIS — E1142 Type 2 diabetes mellitus with diabetic polyneuropathy: Secondary | ICD-10-CM | POA: Diagnosis not present

## 2016-01-04 DIAGNOSIS — M4317 Spondylolisthesis, lumbosacral region: Secondary | ICD-10-CM | POA: Diagnosis not present

## 2016-01-04 DIAGNOSIS — M5416 Radiculopathy, lumbar region: Secondary | ICD-10-CM | POA: Insufficient documentation

## 2016-01-04 MED ORDER — GABAPENTIN 100 MG PO CAPS: 100.0000 mg | ORAL_CAPSULE | Freq: Three times a day (TID) | ORAL | 3 refills | Status: DC

## 2016-01-04 MED ORDER — TIZANIDINE HCL 2 MG PO TABS: 2.0000 mg | ORAL_TABLET | Freq: Two times a day (BID) | ORAL | 2 refills | Status: DC

## 2016-01-04 MED ORDER — TRAMADOL HCL 50 MG PO TABS: 50.0000 mg | ORAL_TABLET | Freq: Two times a day (BID) | ORAL | 5 refills | Status: DC | PRN

## 2016-01-04 NOTE — Patient Instructions (Signed)
Gabapentin Instructions:  Take one Capsule at Bedtime Only  Call office on 01/11/16 to evaluation medication   [phone]

## 2016-01-04 NOTE — Progress Notes (Signed)
Subjective:    Patient ID: Kylie Hurst, female    DOB: 13-Jun-1945, 70 y.o.   MRN: [id number]  HPI: Ms. Kylie Hurst is a 70 year old female who returns for follow up for chronic pain and medication refill. She states her pain is located in her right arm with tingling and numbness, lower back and bilateral feet with tingling and numbness. We will prescribe gabapentin, instructions given she verbalizes understanding.  She rates her pain 6.Her current exercise regime is walking and house hold chores.  Pain Inventory Average Pain 8 Pain Right Now 6 My pain is intermittent, burning, dull and tingling  In the last 24 hours, has pain interfered with the following? General activity 2 Relation with others 1 Enjoyment of life 3 What TIME of day is your pain at its worst? night Sleep (in general) Fair  Pain is worse with: sitting Pain improves with: rest and medication Relief from Meds: 9  Mobility walk without assistance ability to climb steps?  yes do you drive?  yes Do you have any goals in this area?  yes  Function retired  Neuro/Psych bladder control problems numbness tingling spasms loss of taste or smell  Prior Studies Any changes since last visit?  no  Physicians involved in your care Any changes since last visit?  no   Family History  Problem Relation Age of Onset  . Diabetes Mother   . Cancer Sister   . Epilepsy Brother    Social History   Social History  . Marital status: Divorced    Spouse name: N/A  . Number of children: N/A  . Years of education: N/A   Social History Main Topics  . Smoking status: Current Every Day Smoker    Packs/day: 1.00    Years: 47.00    Types: Cigarettes  . Smokeless tobacco: Never Used  . Alcohol use No  . Drug use: No  . Sexual activity: Not on file   Other Topics Concern  . Not on file   Social History Narrative  . No narrative on file   Past Surgical History:  Procedure Laterality Date  . ANTERIOR  CERVICAL DECOMP/DISCECTOMY FUSION N/A 12/18/2012   Procedure: ANTERIOR CERVICAL DECOMPRESSION/DISCECTOMY FUSION 1 LEVEL;  Surgeon: Sinclair Ship, MD;  Location: [facility];  Service: Orthopedics;  Laterality: N/A;  Anterior cervical decompression fusion, cervical 7 - thoracic 1 with instrumentation, allograft  . COLONOSCOPY    . INSERTION OF MESH Left 05/14/2014   Procedure: INSERTION OF MESH;  Surgeon: Donnie Mesa, MD;  Location: [facility];  Service: General;  Laterality: Left;  . NECK SURGERY    . SPIGELIAN HERNIA  05/14/2014  . VENTRAL HERNIA REPAIR Left 05/14/2014   Procedure: ATTEMPTED LAPAROSCOPIC REPAIR LEFT SPIGLIAN HERINA CONVERTED TO OPEN REPAIR;  Surgeon: Donnie Mesa, MD;  Location: [facility];  Service: General;  Laterality: Left;   Past Medical History:  Diagnosis Date  . Aortic aneurysm ([place]) 2011   3.67mm  . Arthritis   . Back spasm   . Bulging discs   . Chronic back pain   . Depression   . Diabetes mellitus without complication (HCC)    fasting 90-110  . Emphysema   . Environmental and seasonal allergies   . Hyperlipidemia   . Hypertension   . Neuromuscular disorder ([place])   . Onycholysis of toenail   . Osteoporosis   . Ovarian cyst 06/01/2009   5 cm  . Restless legs   . Spigelian hernia  left ventral  . Wears glasses    There were no vitals taken for this visit.  Opioid Risk Score:   Fall Risk Score:  `1  Depression screen PHQ 2/9  Depression screen PHQ 2/9 07/05/2015  Decreased Interest 0  Down, Depressed, Hopeless 0  PHQ - 2 Score 0  Altered sleeping 0  Tired, decreased energy 0  Change in appetite 0  Feeling bad or failure about yourself  0  Trouble concentrating 0  Moving slowly or fidgety/restless 0  Suicidal thoughts 0  PHQ-9 Score 0  Difficult doing work/chores Not difficult at all   Review of Systems  Constitutional: Negative.   HENT: Negative.   Eyes: Negative.   Respiratory: Negative.   Cardiovascular: Negative.   Gastrointestinal:  Negative.   Endocrine: Negative.   Genitourinary: Positive for difficulty urinating.  Musculoskeletal: Negative.        Spasms  Skin: Negative.   Allergic/Immunologic: Negative.   Neurological: Positive for numbness.       Tingling Loss taste/smell  Hematological: Negative.   Psychiatric/Behavioral: Negative.   All other systems reviewed and are negative.      Objective:   Physical Exam  Constitutional: She is oriented to person, place, and time. She appears well-developed and well-nourished.  HENT:  Head: Normocephalic and atraumatic.  Neck: Normal range of motion. Neck supple.  Cardiovascular: Normal rate and regular rhythm.   Pulmonary/Chest: Effort normal and breath sounds normal.  Musculoskeletal:  Normal Muscle Bulk and Muscle Testing Reveals: Upper Extremities: Full ROM and Muscle Strength 5/5 Back without spinal Tenderness Noted Lower Extremities: Full ROM and Muscle Strength 5/5 Arises from Table with ease Narrow Based Gait  Neurological: She is alert and oriented to person, place, and time.  Skin: Skin is warm and dry.  Psychiatric: She has a normal mood and affect.  Nursing note and vitals reviewed.         Assessment & Plan:  1. 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 Radiculopathies at L5-S1. Refilled: Tramadol 50 mg one tablet every 12 hours as needed. May take an extra tablet when pain is severe no more than three a day. #80.  We will continue the opioid monitoring program, this consists of regular clinic visits, examinations, urine drug screen, as well as use of [place] Controlled Substance Reporting System. 2. Diabetic peripheral neuropathy: RX: Gabapentin, Instructions Given, she verbalizes understanding.  causing bilateral foot numbness.    Continue Tight Glucose Control. Continue to Monitor.  20 minutes of face to face patient care time was spent during this visit. All questions were encouraged and answered.     F/U in 6 months

## 2016-01-12 ENCOUNTER — Telehealth: Payer: Self-pay | Admitting: Registered Nurse

## 2016-01-12 NOTE — Telephone Encounter (Signed)
ptn called office and states gabapentin is not working - wants new RX- route to ET

## 2016-01-18 NOTE — Telephone Encounter (Signed)
Patient is calling back about message that was previously made.  Please call patient.

## 2016-01-18 NOTE — Telephone Encounter (Signed)
Would recommend increasing gabapentin to 200 mg 3 times a day

## 2016-01-19 NOTE — Telephone Encounter (Signed)
Lasonja called back.  She has only tried taking 100mg  at bedtime.  I told her to go ahead and take it tid as ordered and if she tolerates it, it could be increased further.  She does not want to take medication only and if it doesn't work after a week, she says that she will go to a hand specialist.  She thinks its CTS and would prefer having it surgically repaired and not have to take medication. Noted.

## 2016-01-19 NOTE — Telephone Encounter (Signed)
Left message for Kylie Hurst to call the office.

## 2016-01-20 ENCOUNTER — Ambulatory Visit: Payer: Medicare Other

## 2016-01-21 NOTE — Telephone Encounter (Signed)
Closing encounter

## 2016-01-26 ENCOUNTER — Telehealth: Payer: Self-pay | Admitting: Registered Nurse

## 2016-01-26 NOTE — Telephone Encounter (Signed)
Return Kylie Hurst call, she states she hasn't been taking Gabapentin as prescribed. Encouraged to follow the prescription as ordered.  Also states she thinks the gabapentin gave her a headache today, She has been on gabapentin for the last three weeks without headache. Admits this is the first time, she's been using her gabapentin sporadically . She will resume gabapentin 100 mg TID, she was instructed to call office on Tuesday January 2,2018. Also instructed if she develops a headache to stop the medication. She verbalizes understanding.

## 2016-02-02 ENCOUNTER — Telehealth: Payer: Self-pay | Admitting: *Deleted

## 2016-02-02 NOTE — Telephone Encounter (Signed)
Return Ms. Penaloza call no answer, left message to return the call.

## 2016-02-02 NOTE — Telephone Encounter (Signed)
Rey called to let us know that the medication is not working.(gabapentin)  Please advise

## 2016-02-04 ENCOUNTER — Telehealth: Payer: Self-pay | Admitting: Registered Nurse

## 2016-02-04 NOTE — Telephone Encounter (Signed)
Received a call from Ms. Blough stating she's still experiencing tingling and numbness in her right hand, we will increase the gabapentin to 200 mg  ( two capsules) TID. Instructed to call office on 02/09/2016, she verbalizes understanding.

## 2016-02-04 NOTE — Telephone Encounter (Signed)
Return Ms. Hintze call, no answer. Left message to return the call.

## 2016-02-07 ENCOUNTER — Telehealth: Payer: Self-pay | Admitting: Registered Nurse

## 2016-02-07 NOTE — Telephone Encounter (Signed)
Ptn left 2 voicemails that were completely garbled as if underwater and in a tunnel - only recognized name in order to document for ET to return call

## 2016-02-10 NOTE — Telephone Encounter (Signed)
Patient left another message asking for [name] to please call her

## 2016-02-11 NOTE — Telephone Encounter (Signed)
Kylie Hurst called and says this is the fourth call she has made asking for a call back from [place] or Dr Letta Pate about her gabapentin.  Kylie Hurst has tried to call her but Kylie Hurst does not answer her phone. I tried to call her back just now and got no answer.  I left a detailed message letting her know that we/[facility] had tried calling but she needs to be available and answering her phone. I will pass this to [place] again.

## 2016-02-29 MED ORDER — GABAPENTIN 100 MG PO CAPS: ORAL_CAPSULE | ORAL | 1 refills | Status: DC

## 2016-02-29 NOTE — Telephone Encounter (Signed)
Return Kylie Hurst call, she states she has  increased the Gabapentin to 200 mg TID, as instructed with no relief of pain in her bilateral upper extremities  ( tingling and numbness) We will schedule her for an EMG with Dr. Letta Pate she verbalizes understanding. Also states she is at a friend house and she will have to call the office in the morning to schedule.

## 2016-02-29 NOTE — Telephone Encounter (Signed)
Kylie Hurst has called once more requesting a call back from [facility]. She would like a call back today if possible.

## 2016-04-14 ENCOUNTER — Telehealth: Payer: Self-pay

## 2016-04-14 NOTE — Telephone Encounter (Signed)
Patient was recently prescribed pain medication oxycodone after having surgery, called [place] making sure it was ok to fill this medicine, permission to fill it was granted

## 2016-04-26 ENCOUNTER — Encounter: Payer: Self-pay | Admitting: Vascular Surgery

## 2016-05-05 ENCOUNTER — Telehealth: Payer: Self-pay | Admitting: Vascular Surgery

## 2016-05-05 ENCOUNTER — Other Ambulatory Visit: Payer: Self-pay | Admitting: *Deleted

## 2016-05-05 DIAGNOSIS — I714 Abdominal aortic aneurysm, without rupture, unspecified: Secondary | ICD-10-CM

## 2016-05-05 NOTE — Telephone Encounter (Signed)
This patient was last seen here in 2014 by Dr. Kellie Simmering. Since then, She was scheduled to see Dr. Donnetta Hutching on 02/15/16 but due to his vacation, she was rescheduled by Anne Ng to April 2018. We have attempted to contact this patient several time to make her aware of the scheduling changes as well as  the patient's emergency contact to obtain an updated contact number for the patient. In addition, we have sent letters to the address listed in her chart stating all of this.  As of right now, the patient's appointment has been canceled in order to accommodate emergent cases and a message has been left for the patient to contact us to make a new appointment that is suitable with her schedule.

## 2016-05-09 ENCOUNTER — Ambulatory Visit (HOSPITAL_COMMUNITY): Payer: Medicare Other

## 2016-05-09 ENCOUNTER — Ambulatory Visit: Payer: Medicare Other | Admitting: Vascular Surgery

## 2016-05-16 ENCOUNTER — Other Ambulatory Visit (HOSPITAL_COMMUNITY): Payer: Self-pay

## 2016-05-16 ENCOUNTER — Ambulatory Visit: Payer: Medicaid Other | Admitting: Vascular Surgery

## 2016-06-16 ENCOUNTER — Encounter: Payer: Self-pay | Admitting: Vascular Surgery

## 2016-06-27 ENCOUNTER — Ambulatory Visit (HOSPITAL_COMMUNITY)
Admission: RE | Admit: 2016-06-27 | Discharge: 2016-06-27 | Disposition: A | Discharge status: Home / Self Care | Payer: Medicare Other | Source: Ambulatory Visit | Attending: Vascular Surgery | Admitting: Vascular Surgery

## 2016-06-27 ENCOUNTER — Ambulatory Visit (INDEPENDENT_AMBULATORY_CARE_PROVIDER_SITE_OTHER): Payer: Medicare Other | Admitting: Vascular Surgery

## 2016-06-27 ENCOUNTER — Encounter: Payer: Self-pay | Admitting: Vascular Surgery

## 2016-06-27 VITALS — BP 127/78 | HR 80 | Temp 98.0°F | Resp 16 | Ht <= 58 in | Wt 115.0 lb

## 2016-06-27 DIAGNOSIS — I714 Abdominal aortic aneurysm, without rupture, unspecified: Secondary | ICD-10-CM

## 2016-06-27 NOTE — Progress Notes (Signed)
[facility]  Patient name: Kylie Hurst MRN: [id number] DOB: 1945-07-12 Sex: female  REASON FOR VISIT: Follow-up known abdominal aortic aneurysm  HPI: Kylie Hurst is a 71 y.o. female here for follow-up. Was seen by Dr. Kellie Simmering in 2014 for evaluation of his small infrarenal abdominal aortic aneurysm found incidentally. Is recommended that she undergo serial ultrasound follow-up. She did have a study in 2016 which was not our office. This revealed no change with a maximal diameter 3.7 cm. She is here today for an additional 2 year follow-up. She has no symptoms referable to her aneurysm. Has multiple medical issues as described below. No history of peripheral vascular occlusive disease.  Past Medical History:  Diagnosis Date  . Aortic aneurysm ([place]) 2011   3.74mm  . Arthritis   . Back spasm   . Bulging discs   . Chronic back pain   . Depression   . Diabetes mellitus without complication (HCC)    fasting 90-110  . Emphysema   . Environmental and seasonal allergies   . Hyperlipidemia   . Hypertension   . Neuromuscular disorder ([place])   . Onycholysis of toenail   . Osteoporosis   . Ovarian cyst 06/01/2009   5 cm  . Restless legs   . Spigelian hernia    left ventral  . Wears glasses     Family History  Problem Relation Age of Onset  . Diabetes Mother   . Cancer Sister   . Epilepsy Brother     SOCIAL HISTORY: Social History  Substance Use Topics  . Smoking status: Current Every Day Smoker    Packs/day: 1.00    Years: 47.00    Types: Cigarettes  . Smokeless tobacco: Never Used  . Alcohol use No    No Known Allergies  Current Outpatient Prescriptions  Medication Sig Dispense Refill  . Ascorbic Acid (VITAMIN C) 1000 MG tablet Take by mouth 2 (two) times daily. Patient takes one tablet (1000 mg) daily during Spring, Summer, and Fall. Patient takes one tablet (1000 mg) twice daily in the Winter.      Marland Kitchen aspirin (ASPIRIN CHILDRENS) 81 MG chewable tablet Chew 1 tablet (81 mg total) by mouth daily. 30 tablet 0  . b complex vitamins tablet Take 1 tablet by mouth daily.    Marland Kitchen BIOTIN PO Take 500 mg by mouth daily.     . Calcium Carbonate-Vitamin D 600-400 MG-UNIT per tablet Take 1 tablet by mouth daily.     . cetirizine (ZYRTEC) 10 MG tablet Take 10 mg by mouth at bedtime.    . Denosumab (PROLIA [place]) Inject into the skin.    . hydrochlorothiazide (MICROZIDE) 12.5 MG capsule Take 12.5 mg by mouth daily.    . INCRUSE ELLIPTA 62.5 MCG/INH AEPB Take 62.5 mcg by mouth.    Marland Kitchen lisinopril (PRINIVIL,ZESTRIL) 5 MG tablet Take 5 mg by mouth at bedtime.     . metFORMIN (GLUCOPHAGE) 500 MG tablet Take 500 mg by mouth daily with breakfast.     . Multiple Vitamins-Minerals (MULTIVITAMIN WITH MINERALS) tablet Take 1 tablet by mouth every evening.     . Omega-3 Fatty Acids (FISH OIL) 1000 MG CAPS Take by mouth. Fish oil 1000mg /300mg  Omega 3    . pravastatin (PRAVACHOL) 20 MG tablet Take 20 mg by mouth at bedtime.     Marland Kitchen tiZANidine (ZANAFLEX) 2 MG tablet Take 1 tablet (2 mg total) by mouth 2 (two) times daily. 180 tablet 2  .  traMADol (ULTRAM) 50 MG tablet Take 1 tablet (50 mg total) by mouth every 12 (twelve) hours as needed. May take additional table on days pain is severe, no more than 3 per day 80 tablet 5  . Benzoyl Peroxide (PR BENZOYL PEROXIDE WASH) 7 % LIQD Apply topically.    Hendricks Limes CONTINUING MONTH PAK 1 MG tablet Take 1 mg by mouth daily.    . diphenhydrAMINE (BENADRYL) 25 MG tablet Take 1 tablet (25 mg total) by mouth every 6 (six) hours. (Patient not taking: Reported on 06/27/2016) 20 tablet 0  . gabapentin (NEURONTIN) 100 MG capsule Take two capsules three times a day (Patient not taking: Reported on 06/27/2016) 180 capsule 1   No current facility-administered medications for this visit.     REVIEW OF SYSTEMS:  [X]  denotes positive finding, [ ]  denotes negative finding Cardiac  Comments:  Chest  pain or chest pressure:    Shortness of breath upon exertion:    Short of breath when lying flat:    Irregular heart rhythm:        Vascular    Pain in calf, thigh, or hip brought on by ambulation:    Pain in feet at night that wakes you up from your sleep:     Blood clot in your veins:    Leg swelling:           PHYSICAL EXAM: Vitals:   06/27/16 1201  BP: 127/78  Pulse: 80  Resp: 16  Temp: 98 F (36.7 C)  SpO2: 95%  Weight: 115 lb (52.2 kg)  Height: 4\' 9"  (1.448 m)    GENERAL: The patient is a well-nourished female, in no acute distress. The vital signs are documented above. CARDIOVASCULAR: 2+ radial 2+ femoral 2+ popliteal and 2+ dorsalis pedis pulses bilaterally. No evidence of peripheral artery aneurysm. Moderate abdominal obesity I do not palpate an aneurysm and there is no tenderness PULMONARY: There is good air exchange  MUSCULOSKELETAL: There are no major deformities or cyanosis. NEUROLOGIC: No focal weakness or paresthesias are detected. SKIN: There are no ulcers or rashes noted. PSYCHIATRIC: The patient has a normal affect.  DATA:  Duplex today shows maximal diameter of 3.8 cm  MEDICAL ISSUES: Had long discussion with patient explaining the location of her aneurysm and the follow-up of this. She has had no growth over 4 years now. Would recommend continued two-year surveillance. We will see her in 2 years with ultrasound and office visit    Rosetta Posner, MD [facility] Tel [phone] Pager [phone]

## 2016-07-04 ENCOUNTER — Encounter: Payer: Self-pay | Admitting: Physical Medicine & Rehabilitation

## 2016-07-04 ENCOUNTER — Encounter: Payer: Medicare Other | Attending: Physical Medicine & Rehabilitation

## 2016-07-04 ENCOUNTER — Ambulatory Visit (HOSPITAL_BASED_OUTPATIENT_CLINIC_OR_DEPARTMENT_OTHER): Payer: Medicare Other | Admitting: Physical Medicine & Rehabilitation

## 2016-07-04 VITALS — BP 106/72 | HR 84

## 2016-07-04 DIAGNOSIS — M4317 Spondylolisthesis, lumbosacral region: Secondary | ICD-10-CM

## 2016-07-04 DIAGNOSIS — M4316 Spondylolisthesis, lumbar region: Secondary | ICD-10-CM | POA: Insufficient documentation

## 2016-07-04 DIAGNOSIS — E1140 Type 2 diabetes mellitus with diabetic neuropathy, unspecified: Secondary | ICD-10-CM | POA: Diagnosis not present

## 2016-07-04 DIAGNOSIS — M5417 Radiculopathy, lumbosacral region: Secondary | ICD-10-CM | POA: Insufficient documentation

## 2016-07-04 DIAGNOSIS — M5416 Radiculopathy, lumbar region: Secondary | ICD-10-CM

## 2016-07-04 DIAGNOSIS — G8929 Other chronic pain: Secondary | ICD-10-CM | POA: Insufficient documentation

## 2016-07-04 DIAGNOSIS — M545 Low back pain: Secondary | ICD-10-CM | POA: Insufficient documentation

## 2016-07-04 MED ORDER — TRAMADOL HCL 50 MG PO TABS
50.0000 mg | ORAL_TABLET | Freq: Two times a day (BID) | ORAL | 5 refills | Status: DC | PRN
Start: 2016-07-04 — End: 2017-01-03

## 2016-07-04 NOTE — Patient Instructions (Signed)
May take tylenol 500mg  up to 3 times a day

## 2016-07-04 NOTE — Progress Notes (Signed)
Subjective:    Patient ID: Kylie Hurst, female    DOB: 10-Sep-1945, 71 y.o.   MRN: [id number]  HPI R CTS release per Dr Grandville Silos March 2018, endoscopic  Right hand pain improved  Still with chronic low back pain taking Tylenol TID rather BID now that she is gardening  Tramadol 50mg  BID or TID , #80 tabs per month  Hx R L5 and S1 radiculopathy, chronic seen on EMG/NCV  Pain Inventory Average Pain 7 Pain Right Now 7 My pain is intermittent and burning  In the last 24 hours, has pain interfered with the following? General activity 5 Relation with others 8 Enjoyment of life 8 What TIME of day is your pain at its worst? night Sleep (in general) Fair  Pain is worse with: walking, sitting and standing Pain improves with: rest and medication Relief from Meds: 9  Mobility walk without assistance ability to climb steps?  yes do you drive?  yes  Function retired  Neuro/Psych bladder control problems weakness numbness tingling spasms  Prior Studies Any changes since last visit?  yes  Physicians involved in your care Any changes since last visit?  yes   Family History  Problem Relation Age of Onset  . Diabetes Mother   . Cancer Sister   . Epilepsy Brother    Social History   Social History  . Marital status: Divorced    Spouse name: N/A  . Number of children: N/A  . Years of education: N/A   Social History Main Topics  . Smoking status: Current Every Day Smoker    Packs/day: 1.00    Years: 47.00    Types: Cigarettes  . Smokeless tobacco: Never Used  . Alcohol use No  . Drug use: No  . Sexual activity: Not on file   Other Topics Concern  . Not on file   Social History Narrative  . No narrative on file   Past Surgical History:  Procedure Laterality Date  . ANTERIOR CERVICAL DECOMP/DISCECTOMY FUSION N/A 12/18/2012   Procedure: ANTERIOR CERVICAL DECOMPRESSION/DISCECTOMY FUSION 1 LEVEL;  Surgeon: Sinclair Ship, MD;  Location: [facility];   Service: Orthopedics;  Laterality: N/A;  Anterior cervical decompression fusion, cervical 7 - thoracic 1 with instrumentation, allograft  . COLONOSCOPY    . INSERTION OF MESH Left 05/14/2014   Procedure: INSERTION OF MESH;  Surgeon: Donnie Mesa, MD;  Location: [facility];  Service: General;  Laterality: Left;  . NECK SURGERY    . SPIGELIAN HERNIA  05/14/2014  . VENTRAL HERNIA REPAIR Left 05/14/2014   Procedure: ATTEMPTED LAPAROSCOPIC REPAIR LEFT SPIGLIAN HERINA CONVERTED TO OPEN REPAIR;  Surgeon: Donnie Mesa, MD;  Location: [facility];  Service: General;  Laterality: Left;   Past Medical History:  Diagnosis Date  . Aortic aneurysm ([place]) 2011   3.72mm  . Arthritis   . Back spasm   . Bulging discs   . Chronic back pain   . Depression   . Diabetes mellitus without complication (HCC)    fasting 90-110  . Emphysema   . Environmental and seasonal allergies   . Hyperlipidemia   . Hypertension   . Neuromuscular disorder ([place])   . Onycholysis of toenail   . Osteoporosis   . Ovarian cyst 06/01/2009   5 cm  . Restless legs   . Spigelian hernia    left ventral  . Wears glasses    There were no vitals taken for this visit.  Opioid Risk Score:   Fall Risk  Score:  `1  Depression screen PHQ 2/9  Depression screen PHQ 2/9 07/05/2015  Decreased Interest 0  Down, Depressed, Hopeless 0  PHQ - 2 Score 0  Altered sleeping 0  Tired, decreased energy 0  Change in appetite 0  Feeling bad or failure about yourself  0  Trouble concentrating 0  Moving slowly or fidgety/restless 0  Suicidal thoughts 0  PHQ-9 Score 0  Difficult doing work/chores Not difficult at all    Review of Systems  Constitutional: Positive for unexpected weight change.  HENT: Negative.   Eyes: Negative.   Respiratory: Negative.   Cardiovascular: Negative.   Gastrointestinal: Negative.   Endocrine: Negative.   Genitourinary: Negative.   Musculoskeletal: Negative.   Skin: Negative.   Allergic/Immunologic: Negative.     Neurological: Negative.   Hematological: Negative.   Psychiatric/Behavioral: Negative.   All other systems reviewed and are negative.      Objective:   Physical Exam  Constitutional: She is oriented to person, place, and time. She appears well-developed and well-nourished. No distress.  HENT:  Head: Normocephalic and atraumatic.  Eyes: Conjunctivae and EOM are normal. Pupils are equal, round, and reactive to light.  Neck: Normal range of motion. No tracheal deviation present.  Pulmonary/Chest: No stridor.  Neurological: She is alert and oriented to person, place, and time.  Skin: Skin is warm and dry.  Psychiatric: She has a normal mood and affect.  Nursing note and vitals reviewed.    Decreased right L5 and right S1 sensory onychogryphosis toenails bilaterally, most markedly in the great toes    Assessment & Plan:  1. Lumbar spondylolisthesis with chronic right L5-S1 radiculopathy. Continue tramadol 50 mg 2-3 tablets per day, tizanidine 2 mg twice a day 2. Diabetic neuropathy, strict diabetic control, and currently not painful.  Physical medicine rehabilitation follow-up with nurse practitioner in 6 months  3. Diabetes in need of toenail care, patient has seen podiatry have encouraged her to make appointment for toenail debridement

## 2016-07-07 NOTE — Addendum Note (Signed)
Addended by: Lianne Cure A on: 07/07/2016 01:38 PM   Modules accepted: Orders

## 2016-07-25 ENCOUNTER — Other Ambulatory Visit: Payer: Self-pay | Admitting: Family Medicine

## 2016-07-25 DIAGNOSIS — M810 Age-related osteoporosis without current pathological fracture: Secondary | ICD-10-CM

## 2016-07-25 DIAGNOSIS — Z1231 Encounter for screening mammogram for malignant neoplasm of breast: Secondary | ICD-10-CM

## 2016-08-08 ENCOUNTER — Ambulatory Visit: Payer: Medicare Other

## 2016-08-10 ENCOUNTER — Ambulatory Visit
Admission: RE | Admit: 2016-08-10 | Discharge: 2016-08-10 | Disposition: A | Discharge status: Home / Self Care | Payer: Medicare Other | Source: Ambulatory Visit | Attending: Family Medicine | Admitting: Family Medicine

## 2016-08-10 DIAGNOSIS — Z1231 Encounter for screening mammogram for malignant neoplasm of breast: Secondary | ICD-10-CM

## 2016-08-10 DIAGNOSIS — M810 Age-related osteoporosis without current pathological fracture: Secondary | ICD-10-CM

## 2016-08-24 ENCOUNTER — Encounter: Payer: Self-pay | Admitting: Podiatry

## 2016-08-24 ENCOUNTER — Ambulatory Visit (INDEPENDENT_AMBULATORY_CARE_PROVIDER_SITE_OTHER): Payer: Medicare Other | Admitting: Podiatry

## 2016-08-24 DIAGNOSIS — M79675 Pain in left toe(s): Secondary | ICD-10-CM

## 2016-08-24 DIAGNOSIS — B351 Tinea unguium: Secondary | ICD-10-CM | POA: Diagnosis not present

## 2016-08-24 DIAGNOSIS — M79674 Pain in right toe(s): Secondary | ICD-10-CM | POA: Diagnosis not present

## 2016-08-25 NOTE — Progress Notes (Signed)
Subjective:    Patient ID: Kylie Hurst, female   DOB: 71 y.o.   MRN: [id number]   HPI Ms. Koerner Presents the also concerns of thick, painful, elongated toenails that she cannot trim herself. She states they have not been trimmed and a very long time in the nails again. Long and curling causing pressure to the other nails. She states that she did see another doctor for this but would not trim the nails short and she did not want to any topical medication for nail fungus. She staes that "I am too busy to put something on my toenails twice a day". She presents today requesting nails be trimmed her removed. She states the nails are painful issues in [place] change her shoe size given the long patient of the toenails. Denies any drainage or swelling along the toenail sites. She has no other concerns today.  Review of Systems  All other systems reviewed and are negative.       Objective:  Physical Exam General: AAO x3, NAD  Dermatological: Nails are hypertrophic, dystrophic, brittle, discolored, elongated 10. Nails have yellow discoloration in the nails are significantly elongated curling and putting pressure on the adjacent toenails. No surrounding redness or drainage. Tenderness nails 1-5 bilaterally. No open lesions or pre-ulcerative lesions are identified today.  Vascular: Dorsalis Pedis artery and Posterior Tibial artery pedal pulses are 2/4 bilateral with immedate capillary fill time.There is no pain with calf compression, swelling, warmth, erythema.   Neruologic: Sensation decreased with Derrel Nip monofilament.  Musculoskeletal: No gross boney pedal deformities bilateral. No pain, crepitus, or limitation noted with foot and ankle range of motion bilateral. Muscular strength 5/5 in all groups tested bilateral.  Gait: Unassisted, Nonantalgic.      Assessment:     Symptomatic onychomycosis    Plan:  -Treatment options discussed including all alternatives, risks, and  complications -Etiology of symptoms were discussed -We discussed options for the elongation of toenails as well as treatment options. -Nails debrided 10 without complications or bleeding.I does the nails for culture to [facility].  -Daily foot inspection -Follow-up in 3 months or after nail culture or sooner if any problems arise. In the meantime, encouraged to call the office with any questions, concerns, change in symptoms.   Celesta Gentile, DPM

## 2016-08-28 ENCOUNTER — Telehealth: Payer: Self-pay | Admitting: *Deleted

## 2016-08-28 NOTE — Telephone Encounter (Signed)
Pt states Dr. Jacqualyn Posey stated she could soak her feet 15 minutes, but didn't say how many times a day. I told her she could soak daily.

## 2016-09-05 ENCOUNTER — Telehealth: Payer: Self-pay | Admitting: *Deleted

## 2016-09-05 NOTE — Telephone Encounter (Addendum)
-----   Message from Trula Slade, DPM sent at 09/01/2016  1:26 PM EDT ----- Negative for fungus- can try urea cream. 09/05/2016-Left message requesting pt call back for results.09/07/2016-left message informing pt the fungal culture was negative, Dr. Jacqualyn Posey recommended Urea cream and I explained the use of the Revitaderm 40 our office carried and $22.00 cost and call if she would like to purchase.09/18/2016-Pt called for type of file to be used on her toenails.09/19/2016-Left message informing pt she should use regular emory board, rough side to file the length then finer side to smooth, and the finer side to thin the toenail.

## 2016-09-07 NOTE — Telephone Encounter (Signed)
Well this is Kylie Hurst. Kylie Hurst, Dr. Leigh Aurora nurse called me Tuesday about results of something. I didn't get her call but I called her back yesterday to the number she gave me and I have not heard from her yet. Okay, alright, thank you. Bye.

## 2016-09-27 ENCOUNTER — Ambulatory Visit
Admission: RE | Admit: 2016-09-27 | Discharge: 2016-09-27 | Disposition: A | Discharge status: Home / Self Care | Payer: Medicare Other | Source: Ambulatory Visit | Attending: Family Medicine | Admitting: Family Medicine

## 2016-09-27 ENCOUNTER — Other Ambulatory Visit: Payer: Self-pay | Admitting: Family Medicine

## 2016-09-27 DIAGNOSIS — R0781 Pleurodynia: Secondary | ICD-10-CM

## 2016-10-26 ENCOUNTER — Encounter: Payer: Self-pay | Admitting: Podiatry

## 2016-10-26 ENCOUNTER — Ambulatory Visit (INDEPENDENT_AMBULATORY_CARE_PROVIDER_SITE_OTHER): Payer: Medicare Other | Admitting: Podiatry

## 2016-10-26 DIAGNOSIS — B351 Tinea unguium: Secondary | ICD-10-CM | POA: Diagnosis not present

## 2016-10-26 DIAGNOSIS — M79675 Pain in left toe(s): Secondary | ICD-10-CM

## 2016-10-26 DIAGNOSIS — M79674 Pain in right toe(s): Secondary | ICD-10-CM | POA: Diagnosis not present

## 2016-10-26 MED ORDER — UREA 40 % EX CREA: 1.0000 g | TOPICAL_CREAM | Freq: Two times a day (BID) | CUTANEOUS | 3 refills | Status: DC

## 2016-10-26 NOTE — Progress Notes (Signed)
Subjective: 71 y.o. returns the office today for painful, elongated, thickened toenails which she cannot trim herself. Denies any redness or drainage around the nails. She also presents today to discuss [place] results. Denies any acute changes since last appointment and no new complaints today. Denies any systemic complaints such as fevers, chills, nausea, vomiting.   Objective: AAO 3, NAD DP/PT pulses palpable, CRT less than 3 seconds Nails hypertrophic, dystrophic, elongated, brittle, discolored 10. There is tenderness overlying the nails 1-5 bilaterally. There is no surrounding erythema or drainage along the nail sites. No open lesions or pre-ulcerative lesions are identified. No other areas of tenderness bilateral lower extremities. No overlying edema, erythema, increased warmth. No pain with calf compression, swelling, warmth, erythema.  Assessment: Patient presents with symptomatic onychomycosis  Plan: -Treatment options including alternatives, risks, complications were discussed -Nails sharply debrided 10 without complication/bleeding. -Discussed [place] lab results. Did not show fungus but clinically it does appear to be this. Due to this I have ordered urea cream to help with the nail thickness.  -Discussed daily foot inspection. If there are any changes, to call the office immediately.  -Follow-up in 3 months or sooner if any problems are to arise. In the meantime, encouraged to call the office with any questions, concerns, changes symptoms.  Celesta Gentile, DPM

## 2016-10-31 ENCOUNTER — Telehealth: Payer: Self-pay | Admitting: Podiatry

## 2016-10-31 NOTE — Telephone Encounter (Signed)
I saw Dr. Jacqualyn Posey one day last week. He was supposed to prescribe some medication to thin my nails. I didn't know when to expect it or who it was coming from. I had a call from [facility] about some cream or something. Okay thank you. Let me know something. Thank you. Bye.

## 2016-10-31 NOTE — Telephone Encounter (Signed)
Left message informing pt the toenail cream would be coming from [facility] in [place] and they would call with coverage and delivery information.

## 2016-12-08 ENCOUNTER — Telehealth: Payer: Self-pay | Admitting: Acute Care

## 2016-12-12 ENCOUNTER — Emergency Department (HOSPITAL_COMMUNITY): Payer: Medicare Other

## 2016-12-12 ENCOUNTER — Emergency Department (HOSPITAL_COMMUNITY)
Admission: EM | Admit: 2016-12-12 | Discharge: 2016-12-12 | Disposition: A | Discharge status: Home / Self Care | Payer: Medicare Other | Attending: Emergency Medicine | Admitting: Emergency Medicine

## 2016-12-12 ENCOUNTER — Encounter (HOSPITAL_COMMUNITY): Payer: Self-pay | Admitting: Emergency Medicine

## 2016-12-12 DIAGNOSIS — S46912A Strain of unspecified muscle, fascia and tendon at shoulder and upper arm level, left arm, initial encounter: Secondary | ICD-10-CM

## 2016-12-12 DIAGNOSIS — Z7982 Long term (current) use of aspirin: Secondary | ICD-10-CM | POA: Insufficient documentation

## 2016-12-12 DIAGNOSIS — E1140 Type 2 diabetes mellitus with diabetic neuropathy, unspecified: Secondary | ICD-10-CM | POA: Insufficient documentation

## 2016-12-12 DIAGNOSIS — F17210 Nicotine dependence, cigarettes, uncomplicated: Secondary | ICD-10-CM | POA: Diagnosis not present

## 2016-12-12 DIAGNOSIS — Y999 Unspecified external cause status: Secondary | ICD-10-CM | POA: Diagnosis not present

## 2016-12-12 DIAGNOSIS — Z7984 Long term (current) use of oral hypoglycemic drugs: Secondary | ICD-10-CM | POA: Diagnosis not present

## 2016-12-12 DIAGNOSIS — Y939 Activity, unspecified: Secondary | ICD-10-CM | POA: Diagnosis not present

## 2016-12-12 DIAGNOSIS — X58XXXA Exposure to other specified factors, initial encounter: Secondary | ICD-10-CM | POA: Diagnosis not present

## 2016-12-12 DIAGNOSIS — Y929 Unspecified place or not applicable: Secondary | ICD-10-CM | POA: Insufficient documentation

## 2016-12-12 DIAGNOSIS — S4992XA Unspecified injury of left shoulder and upper arm, initial encounter: Secondary | ICD-10-CM | POA: Diagnosis present

## 2016-12-12 DIAGNOSIS — I10 Essential (primary) hypertension: Secondary | ICD-10-CM | POA: Diagnosis not present

## 2016-12-12 NOTE — Telephone Encounter (Signed)
[place] x 1  For Kylie Hurst in referral dept

## 2016-12-12 NOTE — ED Provider Notes (Signed)
[facility] DEPT Provider Note   CSN: [id number] Arrival date & time: 12/12/16  1249     History   Chief Complaint Chief Complaint  Patient presents with  . Shoulder Pain    HPI Kylie Hurst is a 71 y.o. female history of chronic back pain, diabetes, hypertension, hyperlipidemia presenting with left shoulder pain.  Patient states that she was holding her umbrella and tried to close it and may have strained her left shoulder.  She states that she has pain when she moves her shoulder but denies any fall or injury to the shoulder.  Patient does have a pain medicine doctor and prescribed tramadol.  Denies any numbness or weakness in the arm.   The history is provided by the patient.    Past Medical History:  Diagnosis Date  . Aortic aneurysm ([place]) 2011   3.28mm  . Arthritis   . Back spasm   . Bulging discs   . Chronic back pain   . Depression   . Diabetes mellitus without complication (HCC)    fasting 90-110  . Emphysema   . Environmental and seasonal allergies   . Hyperlipidemia   . Hypertension   . Neuromuscular disorder ([place])   . Onycholysis of toenail   . Osteoporosis   . Ovarian cyst 06/01/2009   5 cm  . Restless legs   . Spigelian hernia    left ventral  . Wears glasses     Patient Active Problem List   Diagnosis Date Noted  . 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 Right lumbar radiculopathy 05/30/2013  . Diabetic neuropathy ([place]) 05/30/2013  . Cervical spondylosis without myelopathy 12/02/2012  . Lumbosacral spondylosis without myelopathy 12/02/2012  . Spondylolisthesis at L5-S1 level 12/02/2012  . AAA (abdominal aortic aneurysm) without rupture ([place]) 07/03/2012    Past Surgical History:  Procedure Laterality Date  . COLONOSCOPY    . NECK SURGERY    . SPIGELIAN HERNIA  05/14/2014    OB History    No data available       Home Medications    Prior to Admission medications   Medication Sig Start Date End Date Taking?  Authorizing Provider  Celedonio Miyamoto 62.5-25 MCG/INH AEPB  08/11/16   [provider]  Ascorbic Acid (VITAMIN C) 1000 MG tablet Take by mouth 2 (two) times daily. Patient takes one tablet (1000 mg) daily during Spring, Summer, and Fall. Patient takes one tablet (1000 mg) twice daily in the Winter.    [provider]  aspirin (ASPIRIN CHILDRENS) 81 MG chewable tablet Chew 1 tablet (81 mg total) by mouth daily. 07/03/14   Kirsteins, Luanna Salk, MD  b complex vitamins tablet Take 1 tablet by mouth daily.    [provider]  Benzoyl Peroxide (PR BENZOYL PEROXIDE [place]) 7 % LIQD Apply topically.    [provider]  BIOTIN PO Take 500 mg by mouth daily.     [provider]  Calcium Carbonate-Vitamin D 600-400 MG-UNIT per tablet Take 1 tablet by mouth daily.     [provider]  cetirizine (ZYRTEC) 10 MG tablet Take 10 mg by mouth at bedtime.    [provider]  CHANTIX CONTINUING MONTH PAK 1 MG tablet Take 1 mg by mouth daily. 06/19/15   [provider]  CHANTIX STARTING MONTH PAK 0.5 MG X 11 & 1 MG X 42 tablet  08/10/16   [provider]  Denosumab (PROLIA [place]) Inject into the skin.  [provider]  diphenhydrAMINE (BENADRYL) 25 MG tablet Take 1 tablet (25 mg total) by mouth every 6 (six) hours. 11/16/13   Comer Locket, PA-C  gabapentin (NEURONTIN) 100 MG capsule Take two capsules three times a day 02/29/16   Bayard Hugger, NP  hydrochlorothiazide (MICROZIDE) 12.5 MG capsule Take 12.5 mg by mouth daily. 05/12/15   [provider]  lisinopril (PRINIVIL,ZESTRIL) 5 MG tablet Take 5 mg by mouth at bedtime.     [provider]  metFORMIN (GLUCOPHAGE) 500 MG tablet Take 500 mg by mouth daily with breakfast.     [provider]  Multiple Vitamins-Minerals (MULTIVITAMIN WITH MINERALS) tablet Take 1 tablet by mouth every evening.     [provider]  Omega-3 Fatty Acids (FISH OIL) 1000 MG  CAPS Take by mouth. Fish oil 1000mg /300mg  Omega 3    [provider]  Potassium Chloride ER 20 MEQ TBCR  05/17/16   [provider]  pravastatin (PRAVACHOL) 20 MG tablet Take 20 mg by mouth at bedtime.     [provider]  [facility] injection  08/08/16   [provider]  tiZANidine (ZANAFLEX) 2 MG tablet Take 1 tablet (2 mg total) by mouth 2 (two) times daily. 01/04/16   Bayard Hugger, NP  traMADol (ULTRAM) 50 MG tablet Take 1 tablet (50 mg total) by mouth every 12 (twelve) hours as needed. May take additional table on days pain is severe, no more than 3 per day 07/04/16   Kirsteins, Luanna Salk, MD  urea (CARMOL) 40 % CREA Apply 1-2 g topically 2 (two) times daily. 10/26/16   Trula Slade, DPM    Family History Family History  Problem Relation Age of Onset  . Diabetes Mother   . Cancer Sister   . Epilepsy Brother     Social History Social History   Tobacco Use  . Smoking status: Current Every Day Smoker    Packs/day: 1.00    Years: 47.00    Pack years: 47.00    Types: Cigarettes  . Smokeless tobacco: Never Used  Substance Use Topics  . Alcohol use: No  . Drug use: No     Allergies   Patient has no known allergies.   Review of Systems Review of Systems  Musculoskeletal:       L shoulder pain   All other systems reviewed and are negative.    Physical Exam Updated Vital Signs BP 138/88 (BP Location: Left Arm)   Pulse 83   Temp 98.9 F (37.2 C) (Oral)   Resp 19   Ht 4\' 9"  (1.448 m)   Wt 49.9 kg (110 lb)   SpO2 97%   BMI 23.80 kg/m   Physical Exam  Constitutional: She appears well-developed.  HENT:  Head: Normocephalic.  Eyes: Pupils are equal, round, and reactive to light.  Neck: Normal range of motion.  Cardiovascular: Normal rate.  Pulmonary/Chest: Effort normal.  Abdominal: Soft.  Musculoskeletal:  No obvious deformity L shoulder, dec ROM from pain. 2+ radial pulses. Nl strength and sensation L upper extremity.     Neurological: She is alert.  Skin: Skin is warm.  Psychiatric: She has a normal mood and affect.  Nursing note and vitals reviewed.    ED Treatments / Results  Labs (all labs ordered are listed, but only abnormal results are displayed) Labs Reviewed - No data to display  EKG  EKG Interpretation None       Radiology Dg Shoulder Left  Result  Date: 12/12/2016 CLINICAL DATA:  Left shoulder pain EXAM: LEFT SHOULDER - 2+ VIEW COMPARISON:  None. FINDINGS: No fracture or dislocation is seen. The joint spaces are preserved. Visualized soft tissues are within normal limits. Visualized left lung is essentially clear. Cervical spine fixation hardware, incompletely visualized. IMPRESSION: Negative. Electronically Signed   By: Julian Hy M.D.   On: 12/12/2016 13:30    Procedures Procedures (including critical care time)  Medications Ordered in ED Medications - No data to display   Initial Impression / Assessment and Plan / ED Course  I have reviewed the triage vital signs and the nursing notes.  Pertinent labs & imaging results that were available during my care of the patient were reviewed by me and considered in my medical decision making (see chart for details).    Kylie Hurst is a 71 y.o. female here with L shoulder strain while closing the umbrella. xrays showed no fracture. Suspect rotator cuff tear. She has pain medicine doctor and orthopedic doctor. Given shoulder sling. Will have her call her pain doctor and orthopedic doctor for follow up.    Final Clinical Impressions(s) / ED Diagnoses   Final diagnoses:  None    ED Discharge Orders    None       Drenda Freeze, MD 12/12/16 [phone]

## 2016-12-12 NOTE — Discharge Instructions (Signed)
Take your tramadol as prescribed by your pain doctor. Call your pain doctor if you need more pain meds.   Wear sling for comfort. You should try to move your arm around to prevent frozen shoulder.   See your orthopedic doctor. You likely have a rotator cuff injury. Consider MRI if you have pain in a week   Return to ER if you have worse weakness, numbness, uncontrolled pain

## 2016-12-12 NOTE — ED Triage Notes (Signed)
Patient reports yesterday she was in driver seat of car trying to put down her umbrella and and pulled real hard causing pain in left shoulder. Patient reports that if she holds arm still then no pain but comes on with movement.

## 2016-12-15 NOTE — Telephone Encounter (Signed)
I have now received this referral and will contact pt to schedule SDMV and CT.  Will refer to referral notes and close this message.

## 2016-12-18 ENCOUNTER — Telehealth: Payer: Self-pay | Admitting: Acute Care

## 2016-12-19 ENCOUNTER — Encounter: Payer: Self-pay | Admitting: Emergency Medicine

## 2016-12-19 NOTE — Telephone Encounter (Signed)
Pt called back in and requested to have appt scheduled today. Spoke with pt and reviewed criteria questionnaire. Pt qualified based on questionnaire. Pt has been scheduled for a SDMV on 01/01/2017 at 330. Order placed for LDCT chest. Pt verbalized understanding and denied any further questions or concerns at this time.

## 2016-12-19 NOTE — Telephone Encounter (Signed)
Routing to lung nodule pool for follow up.  

## 2016-12-20 ENCOUNTER — Other Ambulatory Visit: Payer: Self-pay | Admitting: Acute Care

## 2016-12-20 DIAGNOSIS — F17210 Nicotine dependence, cigarettes, uncomplicated: Secondary | ICD-10-CM

## 2017-01-01 ENCOUNTER — Ambulatory Visit (INDEPENDENT_AMBULATORY_CARE_PROVIDER_SITE_OTHER)
Admission: RE | Admit: 2017-01-01 | Discharge: 2017-01-01 | Disposition: A | Discharge status: Home / Self Care | Payer: Medicare Other | Source: Ambulatory Visit | Attending: Acute Care | Admitting: Acute Care

## 2017-01-01 ENCOUNTER — Ambulatory Visit (INDEPENDENT_AMBULATORY_CARE_PROVIDER_SITE_OTHER): Payer: Medicare Other | Admitting: Acute Care

## 2017-01-01 ENCOUNTER — Encounter: Payer: Self-pay | Admitting: Acute Care

## 2017-01-01 DIAGNOSIS — F17210 Nicotine dependence, cigarettes, uncomplicated: Secondary | ICD-10-CM

## 2017-01-01 DIAGNOSIS — Z87891 Personal history of nicotine dependence: Secondary | ICD-10-CM

## 2017-01-01 NOTE — Progress Notes (Signed)
Shared Decision Making Visit Lung Cancer Screening Program [phone])   Eligibility:  Age 71 y.o.  Pack Years Smoking History Calculation 52 pack year history (# packs/per year x # years smoked)  Recent History of coughing up blood  no  Unexplained weight loss? no ( >Than 15 pounds within the last 6 months )  Prior History Lung / other cancer no (Diagnosis within the last 5 years already requiring surveillance chest CT Scans).  Smoking Status Current Smoker  Former Smokers: Years since quit: NA  Quit Date: NA  Visit Components:  Discussion included one or more decision making aids. yes  Discussion included risk/benefits of screening. yes  Discussion included potential follow up diagnostic testing for abnormal scans. yes  Discussion included meaning and risk of over diagnosis. yes  Discussion included meaning and risk of False Positives. yes  Discussion included meaning of total radiation exposure. yes  Counseling Included:  Importance of adherence to annual lung cancer LDCT screening. yes  Impact of comorbidities on ability to participate in the program. yes  Ability and willingness to under diagnostic treatment. yes  Smoking Cessation Counseling:  Current Smokers:   Discussed importance of smoking cessation. yes  Information about tobacco cessation classes and interventions provided to patient. yes  Patient provided with "ticket" for LDCT Scan. yes  Symptomatic Patient. no  Counseling NA  Diagnosis Code: Tobacco Use Z72.0  Asymptomatic Patient yes  Counseling (Intermediate counseling: > three minutes counseling) H4174  Former Smokers:   Discussed the importance of maintaining cigarette abstinence. yes  Diagnosis Code: Personal History of Nicotine Dependence. Y81.448  Information about tobacco cessation classes and interventions provided to patient. Yes  Patient provided with "ticket" for LDCT Scan. yes  Written Order for Lung Cancer Screening  with LDCT placed in Epic. Yes (CT Chest Lung Cancer Screening Low Dose W/O CM) [id number] Z12.2-Screening of respiratory organs Z87.891-Personal history of nicotine dependence  I have spent 25 minutes of face to face time with Ms. Manninen discussing the risks and benefits of lung cancer screening. We viewed a power point together that explained in detail the above noted topics. We paused at intervals to allow for questions to be asked and answered to ensure understanding.We discussed that the single most powerful action that she can take to decrease her risk of developing lung cancer is to quit smoking. We discussed whether or not she is ready to commit to setting a quit date. She is currently on Chantix and working on smoking cessation. We discussed options for tools to aid in quitting smoking including nicotine replacement therapy, non-nicotine medications, support groups, Quit Smart classes, and behavior modification. We discussed that often times setting smaller, more achievable goals, such as eliminating 1 cigarette a day for a week and then 2 cigarettes a day for a week can be helpful in slowly decreasing the number of cigarettes smoked. This allows for a sense of accomplishment as well as providing a clinical benefit. I gave her  the " Be Stronger Than Your Excuses" card with contact information for community resources, classes, free nicotine replacement therapy, and access to mobile apps, text messaging, and on-line smoking cessation help. I have also given her  my card and contact information in the event she  needs to contact me. We discussed the time and location of the scan, and that either Doroteo Glassman RN or I will call with the results within 24-48 hours of receiving them. I have offered her   a copy of the power  point we viewed  as a resource in the event they need reinforcement of the concepts we discussed today in the office. The patient verbalized understanding of all of  the above and had no  further questions upon leaving the office. They have my contact information in the event they have any further questions.  I spent 4 minutes counseling on smoking cessation and the health risks of continued tobacco abuse.  I explained to the patient that there has been a high incidence of coronary artery disease noted on these exams. I explained that this is a non-gated exam therefore degree or severity cannot be determined. This patient is currently on statin therapy. I have asked the patient to follow-up with their PCP regarding any incidental finding of coronary artery disease and management with diet or medication as their PCP  feels is clinically indicated. The patient verbalized understanding of the above and had no further questions upon completion of the visit.      Magdalen Spatz, NP 01/01/2017

## 2017-01-03 ENCOUNTER — Telehealth: Payer: Self-pay | Admitting: Registered Nurse

## 2017-01-03 ENCOUNTER — Encounter: Payer: Self-pay | Admitting: Registered Nurse

## 2017-01-03 ENCOUNTER — Encounter: Payer: Medicare Other | Attending: Registered Nurse | Admitting: Registered Nurse

## 2017-01-03 VITALS — BP 123/78 | HR 79 | Resp 14

## 2017-01-03 DIAGNOSIS — M5416 Radiculopathy, lumbar region: Secondary | ICD-10-CM | POA: Diagnosis not present

## 2017-01-03 DIAGNOSIS — J439 Emphysema, unspecified: Secondary | ICD-10-CM | POA: Diagnosis not present

## 2017-01-03 DIAGNOSIS — Z79899 Other long term (current) drug therapy: Secondary | ICD-10-CM | POA: Insufficient documentation

## 2017-01-03 DIAGNOSIS — Z820 Family history of epilepsy and other diseases of the nervous system: Secondary | ICD-10-CM | POA: Diagnosis not present

## 2017-01-03 DIAGNOSIS — E1142 Type 2 diabetes mellitus with diabetic polyneuropathy: Secondary | ICD-10-CM | POA: Insufficient documentation

## 2017-01-03 DIAGNOSIS — F17210 Nicotine dependence, cigarettes, uncomplicated: Secondary | ICD-10-CM | POA: Diagnosis not present

## 2017-01-03 DIAGNOSIS — F329 Major depressive disorder, single episode, unspecified: Secondary | ICD-10-CM | POA: Insufficient documentation

## 2017-01-03 DIAGNOSIS — M25512 Pain in left shoulder: Secondary | ICD-10-CM | POA: Insufficient documentation

## 2017-01-03 DIAGNOSIS — Z5181 Encounter for therapeutic drug level monitoring: Secondary | ICD-10-CM

## 2017-01-03 DIAGNOSIS — E785 Hyperlipidemia, unspecified: Secondary | ICD-10-CM | POA: Insufficient documentation

## 2017-01-03 DIAGNOSIS — Z833 Family history of diabetes mellitus: Secondary | ICD-10-CM | POA: Insufficient documentation

## 2017-01-03 DIAGNOSIS — G2581 Restless legs syndrome: Secondary | ICD-10-CM | POA: Diagnosis not present

## 2017-01-03 DIAGNOSIS — Z981 Arthrodesis status: Secondary | ICD-10-CM | POA: Diagnosis not present

## 2017-01-03 DIAGNOSIS — G8929 Other chronic pain: Secondary | ICD-10-CM | POA: Diagnosis present

## 2017-01-03 DIAGNOSIS — I10 Essential (primary) hypertension: Secondary | ICD-10-CM | POA: Insufficient documentation

## 2017-01-03 DIAGNOSIS — M4317 Spondylolisthesis, lumbosacral region: Secondary | ICD-10-CM | POA: Diagnosis not present

## 2017-01-03 DIAGNOSIS — M25511 Pain in right shoulder: Secondary | ICD-10-CM

## 2017-01-03 MED ORDER — TRAMADOL HCL 50 MG PO TABS: 50.0000 mg | ORAL_TABLET | Freq: Three times a day (TID) | ORAL | 5 refills | Status: DC | PRN

## 2017-01-03 NOTE — Progress Notes (Signed)
Subjective:    Patient ID: Kylie Hurst, female    DOB: Feb 12, 1945, 71 y.o.   MRN: [id number]  HPI: Ms. Kylie Hurst is a 71 year old female who returns for follow up appointment for chronic pain and medication refill. She states her pain is located in her bilateral shoulders L>R, lower back and bilateral feet with tingling and numbness. Also reports her bilateral shoulder pain and lower back pain  has increased in intensity over the last few months and states she receives relief of her pain when she's able to take her Tramadol 3 times a day, we will increase her tramadol tablets to 90 tablets, she verbalizes understanding. She refuses gabapentin at this time. She rates her pain 8. Her current exercise regime is walking and house hold chores.  On 12/12/2016 [name] went to [facility]  For Left Shoulder Strain.   [name] Morphine equivalent is 15.38 MME.    Pain Inventory Average Pain 8 Pain Right Now 8 My pain is intermittent and aching  In the last 24 hours, has pain interfered with the following? General activity 3 Relation with others 0 Enjoyment of life 5 What TIME of day is your pain at its worst? night Sleep (in general) Fair  Pain is worse with: bending, sitting and some activites Pain improves with: rest and medication Relief from Meds: 9  Mobility walk without assistance how many minutes can you walk? 15 ability to climb steps?  yes do you drive?  yes transfers alone Do you have any goals in this area?  yes  Function retired  Neuro/Psych bladder control problems bowel control problems numbness spasms  Prior Studies Any changes since last visit?  no  Physicians involved in your care Any changes since last visit?  no   Family History  Problem Relation Age of Onset  . Diabetes Mother   . Cancer Sister   . Epilepsy Brother    Social History   Socioeconomic History  . Marital status: Divorced    Spouse name:  None  . Number of children: None  . Years of education: None  . Highest education level: None  Social Needs  . Financial resource strain: None  . Food insecurity - worry: None  . Food insecurity - inability: None  . Transportation needs - medical: None  . Transportation needs - non-medical: None  Occupational History  . None  Tobacco Use  . Smoking status: Current Every Day Smoker    Packs/day: 1.00    Years: 52.00    Pack years: 52.00    Types: Cigarettes  . Smokeless tobacco: Never Used  . Tobacco comment: Currently on Chantix  Substance and Sexual Activity  . Alcohol use: No  . Drug use: No  . Sexual activity: None  Other Topics Concern  . None  Social History Narrative  . None   Past Surgical History:  Procedure Laterality Date  . ANTERIOR CERVICAL DECOMP/DISCECTOMY FUSION N/A 12/18/2012   Procedure: ANTERIOR CERVICAL DECOMPRESSION/DISCECTOMY FUSION 1 LEVEL;  Surgeon: Sinclair Ship, MD;  Location: [facility];  Service: Orthopedics;  Laterality: N/A;  Anterior cervical decompression fusion, cervical 7 - thoracic 1 with instrumentation, allograft  . COLONOSCOPY    . INSERTION OF MESH Left 05/14/2014   Procedure: INSERTION OF MESH;  Surgeon: Donnie Mesa, MD;  Location: [facility];  Service: General;  Laterality: Left;  . NECK SURGERY    . SPIGELIAN HERNIA  05/14/2014  . VENTRAL HERNIA REPAIR  Left 05/14/2014   Procedure: ATTEMPTED LAPAROSCOPIC REPAIR LEFT SPIGLIAN HERINA CONVERTED TO OPEN REPAIR;  Surgeon: Donnie Mesa, MD;  Location: [facility];  Service: General;  Laterality: Left;   Past Medical History:  Diagnosis Date  . Aortic aneurysm ([place]) 2011   3.48mm  . Arthritis   . Back spasm   . Bulging discs   . Chronic back pain   . Depression   . Diabetes mellitus without complication (HCC)    fasting 90-110  . Emphysema   . Environmental and seasonal allergies   . Hyperlipidemia   . Hypertension   . Neuromuscular disorder ([place])   . Onycholysis of toenail   .  Osteoporosis   . Ovarian cyst 06/01/2009   5 cm  . Restless legs   . Spigelian hernia    left ventral  . Wears glasses    BP 123/78 (BP Location: Left Arm, Patient Position: Sitting, Cuff Size: Normal)   Pulse 79   Resp 14   SpO2 94%   Opioid Risk Score:   Fall Risk Score:  `1  Depression screen PHQ 2/9  Depression screen PHQ 2/9 07/05/2015  Decreased Interest 0  Down, Depressed, Hopeless 0  PHQ - 2 Score 0  Altered sleeping 0  Tired, decreased energy 0  Change in appetite 0  Feeling bad or failure about yourself  0  Trouble concentrating 0  Moving slowly or fidgety/restless 0  Suicidal thoughts 0  PHQ-9 Score 0  Difficult doing work/chores Not difficult at all   Review of Systems  Constitutional: Negative.   HENT: Negative.   Eyes: Negative.   Respiratory: Positive for shortness of breath and wheezing.   Cardiovascular: Negative.   Gastrointestinal: Positive for constipation.  Endocrine: Negative.        High blood sugar  Genitourinary: Positive for difficulty urinating.  Musculoskeletal: Positive for arthralgias and back pain.       Spasms  Skin: Negative.   Allergic/Immunologic: Negative.   Neurological: Positive for numbness.       Tingling Loss taste/smell  Hematological: Negative.   Psychiatric/Behavioral: Negative.   All other systems reviewed and are negative.      Objective:   Physical Exam  Constitutional: She is oriented to person, place, and time. She appears well-developed and well-nourished.  HENT:  Head: Normocephalic and atraumatic.  Neck: Normal range of motion. Neck supple.  Cardiovascular: Normal rate and regular rhythm.  Pulmonary/Chest: Effort normal and breath sounds normal.  Musculoskeletal:  Normal Muscle Bulk and Muscle Testing Reveals: Upper Extremities: Full ROM and Muscle Strength 5/5 Bilateral AC Joint [place] Back without spinal Tenderness Noted Lower Extremities: Full ROM and Muscle Strength 5/5 Arises from Table with  ease Narrow Based Gait  Neurological: She is alert and oriented to person, place, and time.  Skin: Skin is warm and dry.  Psychiatric: She has a normal mood and affect.  Nursing note and vitals reviewed.         Assessment & Plan:  1. 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 Radiculopathies at L5-S1. Refilled: Tramadol 50 mg one tablet every 8 hours as needed,no more than three a day. #90.  We will continue the opioid monitoring program, this consists of regular clinic visits, examinations, urine drug screen, as well as use of [place] Controlled Substance Reporting System. 2. Diabetic peripheral neuropathy: Refuses Gabapentin, continue to monitor, Continue Tight Glucose Control. Continue to Monitor. 3. Chronic Bilateral Shoulder Pain: Continue current medication regimen. Alternate Heat and  Ice Therapy.  20 minutes of face to face patient care time was spent during this visit. All questions were encouraged and answered.   F/U in 6 months

## 2017-01-03 NOTE — Telephone Encounter (Signed)
On 01/03/2017 the [place] was reviewed no conflict was seen on the [place] with multiple prescribers. If there were any discrepancies this would have been reported to her physician.

## 2017-01-04 ENCOUNTER — Other Ambulatory Visit: Payer: Self-pay | Admitting: Acute Care

## 2017-01-04 ENCOUNTER — Other Ambulatory Visit: Payer: Self-pay | Admitting: Registered Nurse

## 2017-01-04 DIAGNOSIS — F17210 Nicotine dependence, cigarettes, uncomplicated: Secondary | ICD-10-CM

## 2017-01-04 DIAGNOSIS — Z122 Encounter for screening for malignant neoplasm of respiratory organs: Secondary | ICD-10-CM

## 2017-01-16 ENCOUNTER — Ambulatory Visit (INDEPENDENT_AMBULATORY_CARE_PROVIDER_SITE_OTHER): Payer: Medicare Other | Admitting: Podiatry

## 2017-01-16 ENCOUNTER — Encounter: Payer: Self-pay | Admitting: Podiatry

## 2017-01-16 DIAGNOSIS — B351 Tinea unguium: Secondary | ICD-10-CM

## 2017-01-16 DIAGNOSIS — E1142 Type 2 diabetes mellitus with diabetic polyneuropathy: Secondary | ICD-10-CM

## 2017-01-16 NOTE — Progress Notes (Signed)
Complaint:  Visit Type: Patient returns to my office for continued preventative foot care services. Complaint: Patient states" my nails have grown long and thick and become painful to walk and wear shoes" Patient has been diagnosed with DM with neuropathy. . The patient presents for preventative foot care services. No changes to ROS  Podiatric Exam: Vascular: dorsalis pedis and posterior tibial pulses are palpable bilateral. Capillary return is immediate. Temperature gradient is WNL. Skin turgor WNL  Sensorium: Normal Semmes Weinstein monofilament test. Normal tactile sensation bilaterally. Nail Exam: Pt has thick disfigured discolored nails with subungual debris noted bilateral entire nail hallux through fifth toenails Ulcer Exam: There is no evidence of ulcer or pre-ulcerative changes or infection. Orthopedic Exam: Muscle tone and strength are WNL. No limitations in general ROM. No crepitus or effusions noted. Foot type and digits show no abnormalities. Bony prominences are unremarkable. Skin: No Porokeratosis. No infection or ulcers  Diagnosis:  Onychomycosis, , Pain in right toe, pain in left toes  Treatment & Plan Procedures and Treatment: Consent by patient was obtained for treatment procedures.   Debridement of mycotic and hypertrophic toenails, 1 through 5 bilateral and clearing of subungual debris. No ulceration, no infection noted.  Return Visit-Office Procedure: Patient instructed to return to the office for a follow up visit 3 months for continued evaluation and treatment.    Jamarkis Branam DPM 

## 2017-01-18 ENCOUNTER — Ambulatory Visit: Payer: Medicare Other | Admitting: Podiatry

## 2017-04-17 ENCOUNTER — Ambulatory Visit (INDEPENDENT_AMBULATORY_CARE_PROVIDER_SITE_OTHER): Payer: Medicare Other | Admitting: Podiatry

## 2017-04-17 ENCOUNTER — Encounter: Payer: Self-pay | Admitting: Podiatry

## 2017-04-17 DIAGNOSIS — M79674 Pain in right toe(s): Secondary | ICD-10-CM

## 2017-04-17 DIAGNOSIS — B351 Tinea unguium: Secondary | ICD-10-CM | POA: Diagnosis not present

## 2017-04-17 DIAGNOSIS — M79675 Pain in left toe(s): Secondary | ICD-10-CM

## 2017-04-17 NOTE — Progress Notes (Signed)
Complaint:  Visit Type: Patient returns to my office for continued preventative foot care services. Complaint: Patient states" my nails have grown long and thick and become painful to walk and wear shoes" Patient has been diagnosed with DM with neuropathy. . The patient presents for preventative foot care services. No changes to ROS  Podiatric Exam: Vascular: dorsalis pedis and posterior tibial pulses are palpable bilateral. Capillary return is immediate. Temperature gradient is WNL. Skin turgor WNL  Sensorium: Normal Semmes Weinstein monofilament test. Normal tactile sensation bilaterally. Nail Exam: Pt has thick disfigured discolored nails with subungual debris noted bilateral entire nail hallux through fifth toenails Ulcer Exam: There is no evidence of ulcer or pre-ulcerative changes or infection. Orthopedic Exam: Muscle tone and strength are WNL. No limitations in general ROM. No crepitus or effusions noted. Foot type and digits show no abnormalities. Bony prominences are unremarkable. Skin: No Porokeratosis. No infection or ulcers  Diagnosis:  Onychomycosis, , Pain in right toe, pain in left toes  Treatment & Plan Procedures and Treatment: Consent by patient was obtained for treatment procedures.   Debridement of mycotic and hypertrophic toenails, 1 through 5 bilateral and clearing of subungual debris. No ulceration, no infection noted.  Return Visit-Office Procedure: Patient instructed to return to the office for a follow up visit 3 months for continued evaluation and treatment.    Lemmie Steinhaus DPM 

## 2017-07-03 ENCOUNTER — Ambulatory Visit (HOSPITAL_BASED_OUTPATIENT_CLINIC_OR_DEPARTMENT_OTHER): Payer: Medicare Other | Admitting: Physical Medicine & Rehabilitation

## 2017-07-03 ENCOUNTER — Encounter: Payer: Medicare Other | Attending: Physical Medicine & Rehabilitation

## 2017-07-03 ENCOUNTER — Encounter: Payer: Self-pay | Admitting: Physical Medicine & Rehabilitation

## 2017-07-03 VITALS — BP 124/85 | HR 78 | Resp 14 | Ht <= 58 in | Wt 102.0 lb

## 2017-07-03 DIAGNOSIS — M5416 Radiculopathy, lumbar region: Secondary | ICD-10-CM | POA: Insufficient documentation

## 2017-07-03 DIAGNOSIS — M4317 Spondylolisthesis, lumbosacral region: Secondary | ICD-10-CM

## 2017-07-03 DIAGNOSIS — E1142 Type 2 diabetes mellitus with diabetic polyneuropathy: Secondary | ICD-10-CM | POA: Insufficient documentation

## 2017-07-03 DIAGNOSIS — E785 Hyperlipidemia, unspecified: Secondary | ICD-10-CM | POA: Insufficient documentation

## 2017-07-03 DIAGNOSIS — I10 Essential (primary) hypertension: Secondary | ICD-10-CM | POA: Insufficient documentation

## 2017-07-03 DIAGNOSIS — F17210 Nicotine dependence, cigarettes, uncomplicated: Secondary | ICD-10-CM | POA: Insufficient documentation

## 2017-07-03 MED ORDER — TRAMADOL HCL 50 MG PO TABS: 50.0000 mg | ORAL_TABLET | Freq: Three times a day (TID) | ORAL | 5 refills | Status: DC | PRN

## 2017-07-03 NOTE — Progress Notes (Signed)
Subjective:    Patient ID: Kylie Hurst, female    DOB: 1945/09/15, 72 y.o.   MRN: [id number]  HPI 72 yo female with lumbar spondylolisthesis without sciatic discomfort  Right leg aching later in the day.  Pain is possibly aggravated by walking.  Does not keep her awake at noc We discussed that she has hx of R L5 radiculopathy.  Smokes about 1/2 ppd  Left shoulder pain post surgery ~8mo ago, going to PT Hydrocodone post op x 1 wk  Dr Lynann Bologna did ACDF C7-T1 2014   Pain Inventory Average Pain 8 Pain Right Now 7 My pain is dull and aching  In the last 24 hours, has pain interfered with the following? General activity 5 Relation with others 2 Enjoyment of life 2 What TIME of day is your pain at its worst? evening Sleep (in general) Fair  Pain is worse with: walking, bending, sitting and standing Pain improves with: rest and medication Relief from Meds: 8  Mobility walk without assistance how many minutes can you walk? 30 ability to climb steps?  yes do you drive?  yes transfers alone Do you have any goals in this area?  yes  Function not employed: date last employed .  Neuro/Psych bladder control problems bowel control problems  Prior Studies Any changes since last visit?  no  Physicians involved in your care Any changes since last visit?  no   Family History  Problem Relation Age of Onset  . Diabetes Mother   . Cancer Sister   . Epilepsy Brother    Social History   Socioeconomic History  . Marital status: Divorced    Spouse name: Not on file  . Number of children: Not on file  . Years of education: Not on file  . Highest education level: Not on file  Occupational History  . Not on file  Social Needs  . Financial resource strain: Not on file  . Food insecurity:    Worry: Not on file    Inability: Not on file  . Transportation needs:    Medical: Not on file    Non-medical: Not on file  Tobacco Use  . Smoking status: Current Every Day  Smoker    Packs/day: 1.00    Years: 52.00    Pack years: 52.00    Types: Cigarettes  . Smokeless tobacco: Never Used  . Tobacco comment: Currently on Chantix  Substance and Sexual Activity  . Alcohol use: No  . Drug use: No  . Sexual activity: Not on file  Lifestyle  . Physical activity:    Days per week: Not on file    Minutes per session: Not on file  . Stress: Not on file  Relationships  . Social connections:    Talks on phone: Not on file    Gets together: Not on file    Attends religious service: Not on file    Active member of club or organization: Not on file    Attends meetings of clubs or organizations: Not on file    Relationship status: Not on file  Other Topics Concern  . Not on file  Social History Narrative  . Not on file   Past Surgical History:  Procedure Laterality Date  . ANTERIOR CERVICAL DECOMP/DISCECTOMY FUSION N/A 12/18/2012   Procedure: ANTERIOR CERVICAL DECOMPRESSION/DISCECTOMY FUSION 1 LEVEL;  Surgeon: Sinclair Ship, MD;  Location: [place];  Service: Orthopedics;  Laterality: N/A;  Anterior cervical decompression fusion, cervical 7 - thoracic  1 with instrumentation, allograft  . COLONOSCOPY    . INSERTION OF MESH Left 05/14/2014   Procedure: INSERTION OF MESH;  Surgeon: Donnie Mesa, MD;  Location: [place];  Service: General;  Laterality: Left;  . NECK SURGERY    . SPIGELIAN HERNIA  05/14/2014  . VENTRAL HERNIA REPAIR Left 05/14/2014   Procedure: ATTEMPTED LAPAROSCOPIC REPAIR LEFT SPIGLIAN HERINA CONVERTED TO OPEN REPAIR;  Surgeon: Donnie Mesa, MD;  Location: [place];  Service: General;  Laterality: Left;   Past Medical History:  Diagnosis Date  . Aortic aneurysm ([place]) 2011   3.36mm  . Arthritis   . Back spasm   . Bulging discs   . Chronic back pain   . Depression   . Diabetes mellitus without complication (HCC)    fasting 90-110  . Emphysema   . Environmental and seasonal allergies   . Hyperlipidemia   . Hypertension   . Neuromuscular  disorder ([place])   . Onycholysis of toenail   . Osteoporosis   . Ovarian cyst 06/01/2009   5 cm  . Restless legs   . Spigelian hernia    left ventral  . Wears glasses    BP 124/85   Pulse 78   Resp 14   Ht 4\' 9"  (1.448 m)   Wt 102 lb (46.3 kg)   SpO2 95%   BMI 22.07 kg/m   Opioid Risk Score:   Fall Risk Score:  `1  Depression screen PHQ 2/9  Depression screen [facility] 2/9 07/03/2017 07/05/2015  Decreased Interest 0 0  Down, Depressed, Hopeless 0 0  PHQ - 2 Score 0 0  Altered sleeping - 0  Tired, decreased energy - 0  Change in appetite - 0  Feeling bad or failure about yourself  - 0  Trouble concentrating - 0  Moving slowly or fidgety/restless - 0  Suicidal thoughts - 0  PHQ-9 Score - 0  Difficult doing work/chores - Not difficult at all    Review of Systems  Constitutional: Negative.   HENT: Negative.   Eyes: Negative.   Respiratory: Positive for cough.   Gastrointestinal: Positive for constipation.  Endocrine:       High blood sugar   Genitourinary: Positive for difficulty urinating.  Musculoskeletal: Positive for arthralgias and back pain.       Spasms   Skin: Negative.   Neurological: Positive for numbness.  Hematological: Negative.   Psychiatric/Behavioral: Negative.        Objective:   Physical Exam  Constitutional: She is oriented to person, place, and time. She appears well-developed and well-nourished.  HENT:  Head: Normocephalic and atraumatic.  Eyes: Pupils are equal, round, and reactive to light. EOM are normal.  Neck: Normal range of motion.  Neurological: She is alert and oriented to person, place, and time.  Psychiatric: She has a normal mood and affect.  Nursing note and vitals reviewed.  Pain with Left shoulder ROM, abd and  Diminished sensation R L4 and L5 dermatome Negative Straight leg raise      Assessment & Plan:  1.  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  We discussed that an epidural injection would be helpful if the pain gets to the point where she would like more aggressive treatment. Pain control has been overall good with the tramadol and she does not take this as frequently as prescribed.  She takes this less than 3 times a day. Follow-up in 6 months, she will call if she would like to be scheduled for right L5-S1 translaminar epidural injection under fluoroscopic guidance.

## 2017-07-03 NOTE — Patient Instructions (Addendum)
Practice getting up and down from a chair 10times in a row , twice a day  If the Right leg starts hurting more, I'd recommend an epidural injection

## 2017-07-05 ENCOUNTER — Ambulatory Visit: Payer: Medicare Other | Admitting: Physical Medicine & Rehabilitation

## 2017-07-05 ENCOUNTER — Ambulatory Visit: Payer: Medicare Other

## 2017-07-07 ENCOUNTER — Other Ambulatory Visit: Payer: Self-pay | Admitting: Physical Medicine & Rehabilitation

## 2017-07-09 NOTE — Telephone Encounter (Signed)
Patient is calling back about her refill on Tizanidine.  She would like it refilled today.  Please call patient.

## 2017-07-09 NOTE — Telephone Encounter (Signed)
Refill Tizanidine. I don't see where it says to continue Tizanidine in last two notes. Is it ok to refill?

## 2017-07-12 ENCOUNTER — Other Ambulatory Visit: Payer: Self-pay | Admitting: Family Medicine

## 2017-07-12 DIAGNOSIS — Z1231 Encounter for screening mammogram for malignant neoplasm of breast: Secondary | ICD-10-CM

## 2017-07-24 ENCOUNTER — Ambulatory Visit: Payer: Medicare Other | Admitting: Podiatry

## 2017-07-25 ENCOUNTER — Encounter: Payer: Self-pay | Admitting: Podiatry

## 2017-07-25 ENCOUNTER — Ambulatory Visit (INDEPENDENT_AMBULATORY_CARE_PROVIDER_SITE_OTHER): Payer: Medicare Other | Admitting: Podiatry

## 2017-07-25 DIAGNOSIS — M79675 Pain in left toe(s): Secondary | ICD-10-CM | POA: Diagnosis not present

## 2017-07-25 DIAGNOSIS — B351 Tinea unguium: Secondary | ICD-10-CM

## 2017-07-25 DIAGNOSIS — M79674 Pain in right toe(s): Secondary | ICD-10-CM

## 2017-07-25 DIAGNOSIS — E1142 Type 2 diabetes mellitus with diabetic polyneuropathy: Secondary | ICD-10-CM

## 2017-07-25 NOTE — Progress Notes (Signed)
Complaint:  Visit Type: Patient returns to my office for continued preventative foot care services. Complaint: Patient states" my nails have grown long and thick and become painful to walk and wear shoes" Patient has been diagnosed with DM with neuropathy. . The patient presents for preventative foot care services. No changes to ROS  Podiatric Exam: Vascular: dorsalis pedis and posterior tibial pulses are palpable bilateral. Capillary return is immediate. Temperature gradient is WNL. Skin turgor WNL  Sensorium: Normal Semmes Weinstein monofilament test. Normal tactile sensation bilaterally. Nail Exam: Pt has thick disfigured discolored nails with subungual debris noted bilateral entire nail hallux through fifth toenails Ulcer Exam: There is no evidence of ulcer or pre-ulcerative changes or infection. Orthopedic Exam: Muscle tone and strength are WNL. No limitations in general ROM. No crepitus or effusions noted. Foot type and digits show no abnormalities. Bony prominences are unremarkable. Skin: No Porokeratosis. No infection or ulcers  Diagnosis:  Onychomycosis, , Pain in right toe, pain in left toes  Treatment & Plan Procedures and Treatment: Consent by patient was obtained for treatment procedures.   Debridement of mycotic and hypertrophic toenails, 1 through 5 bilateral and clearing of subungual debris. No ulceration, no infection noted.  Return Visit-Office Procedure: Patient instructed to return to the office for a follow up visit 3 months for continued evaluation and treatment.    Sylina Henion DPM 

## 2017-08-15 ENCOUNTER — Ambulatory Visit
Admission: RE | Admit: 2017-08-15 | Discharge: 2017-08-15 | Disposition: A | Discharge status: Home / Self Care | Payer: Medicare Other | Source: Ambulatory Visit | Attending: Family Medicine | Admitting: Family Medicine

## 2017-08-15 DIAGNOSIS — Z1231 Encounter for screening mammogram for malignant neoplasm of breast: Secondary | ICD-10-CM

## 2017-10-24 ENCOUNTER — Encounter: Payer: Self-pay | Admitting: Podiatry

## 2017-10-24 ENCOUNTER — Other Ambulatory Visit: Payer: Self-pay | Admitting: *Deleted

## 2017-10-24 ENCOUNTER — Ambulatory Visit (INDEPENDENT_AMBULATORY_CARE_PROVIDER_SITE_OTHER): Payer: Medicare Other | Admitting: Podiatry

## 2017-10-24 DIAGNOSIS — B351 Tinea unguium: Secondary | ICD-10-CM

## 2017-10-24 DIAGNOSIS — E1142 Type 2 diabetes mellitus with diabetic polyneuropathy: Secondary | ICD-10-CM | POA: Diagnosis not present

## 2017-10-24 DIAGNOSIS — M79674 Pain in right toe(s): Secondary | ICD-10-CM | POA: Diagnosis not present

## 2017-10-24 DIAGNOSIS — L84 Corns and callosities: Secondary | ICD-10-CM | POA: Diagnosis not present

## 2017-10-24 DIAGNOSIS — M79675 Pain in left toe(s): Secondary | ICD-10-CM | POA: Diagnosis not present

## 2017-10-24 NOTE — Progress Notes (Signed)
Complaint:  Visit Type: Patient returns to my office for continued preventative foot care services. Complaint: Patient states" my nails have grown long and thick and become painful to walk and wear shoes" Patient has been diagnosed with DM with neuropathy.. The patient presents for preventative foot care services. No changes to ROS  Podiatric Exam: Vascular: dorsalis pedis and posterior tibial pulses are palpable bilateral. Capillary return is immediate. Temperature gradient is WNL. Skin turgor WNL  Sensorium: Normal Semmes Weinstein monofilament test. Normal tactile sensation bilaterally. Nail Exam: Pt has thick disfigured discolored nails with subungual debris noted bilateral entire nail hallux through fifth toenails Ulcer Exam: There is no evidence of ulcer or pre-ulcerative changes or infection. Orthopedic Exam: Muscle tone and strength are WNL. No limitations in general ROM. No crepitus or effusions noted. Foot type and digits show no abnormalities. Bony prominences are unremarkable. Skin: No Porokeratosis. No infection or ulcers.  Heloma durum fifth toe right foot.  Diagnosis:  Onychomycosis, , Pain in right toe, pain in left toes,  Heloma durum fifth right foot.  Treatment & Plan Procedures and Treatment: Consent by patient was obtained for treatment procedures.   Debridement of mycotic and hypertrophic toenails, 1 through 5 bilateral and clearing of subungual debris. No ulceration, no infection noted. [facility]  HD. Return Visit-Office Procedure: Patient instructed to return to the office for a follow up visit 3 months for continued evaluation and treatment.    Mylie Mccurley DPM 

## 2018-01-02 ENCOUNTER — Encounter: Payer: Medicare Other | Attending: Registered Nurse | Admitting: Registered Nurse

## 2018-01-02 ENCOUNTER — Encounter: Payer: Self-pay | Admitting: Registered Nurse

## 2018-01-02 VITALS — BP 128/74 | HR 74 | Ht <= 58 in | Wt 108.0 lb

## 2018-01-02 DIAGNOSIS — I10 Essential (primary) hypertension: Secondary | ICD-10-CM | POA: Insufficient documentation

## 2018-01-02 DIAGNOSIS — E785 Hyperlipidemia, unspecified: Secondary | ICD-10-CM | POA: Insufficient documentation

## 2018-01-02 DIAGNOSIS — G894 Chronic pain syndrome: Secondary | ICD-10-CM

## 2018-01-02 DIAGNOSIS — M4317 Spondylolisthesis, lumbosacral region: Secondary | ICD-10-CM | POA: Diagnosis not present

## 2018-01-02 DIAGNOSIS — Z5181 Encounter for therapeutic drug level monitoring: Secondary | ICD-10-CM

## 2018-01-02 DIAGNOSIS — Z79899 Other long term (current) drug therapy: Secondary | ICD-10-CM

## 2018-01-02 DIAGNOSIS — F17210 Nicotine dependence, cigarettes, uncomplicated: Secondary | ICD-10-CM | POA: Insufficient documentation

## 2018-01-02 DIAGNOSIS — E1142 Type 2 diabetes mellitus with diabetic polyneuropathy: Secondary | ICD-10-CM | POA: Insufficient documentation

## 2018-01-02 DIAGNOSIS — M5416 Radiculopathy, lumbar region: Secondary | ICD-10-CM | POA: Diagnosis not present

## 2018-01-02 MED ORDER — TIZANIDINE HCL 2 MG PO TABS: 2.0000 mg | ORAL_TABLET | Freq: Two times a day (BID) | ORAL | 1 refills | Status: DC

## 2018-01-02 MED ORDER — TRAMADOL HCL 50 MG PO TABS: 50.0000 mg | ORAL_TABLET | Freq: Three times a day (TID) | ORAL | 5 refills | Status: DC | PRN

## 2018-01-02 NOTE — Progress Notes (Signed)
Subjective:    Patient ID: Kylie Hurst, female    DOB: 12-30-1945, 72 y.o.   MRN: [id number]  HPI: Kylie Hurst is a 72 y.o. female who returns for follow up appointment for chronic pain and medication refill. She states her pain is located in her lower back and bilateral lower extremities and bilateral feet with tingling. She rates her pain 5. Her current exercise regime is walking.  Kylie Hurst Morphine equivalent is 15.00  MME.    Pain Inventory Average Pain 6 Pain Right Now 5 My pain is dull and tingling  In the last 24 hours, has pain interfered with the following? General activity 5 Relation with others 2 Enjoyment of life 2 What TIME of day is your pain at its worst? night Sleep (in general) Fair  Pain is worse with: sitting and some activites Pain improves with: rest and medication Relief from Meds: na  Mobility walk without assistance ability to climb steps?  yes do you drive?  yes  Function retired  Neuro/Psych bladder control problems bowel control problems numbness tingling spasms  Prior Studies Any changes since last visit?  no  Physicians involved in your care Any changes since last visit?  no   Family History  Problem Relation Age of Onset  . Diabetes Mother   . Cancer Sister   . Epilepsy Brother    Social History   Socioeconomic History  . Marital status: Divorced    Spouse name: Not on file  . Number of children: Not on file  . Years of education: Not on file  . Highest education level: Not on file  Occupational History  . Not on file  Social Needs  . Financial resource strain: Not on file  . Food insecurity:    Worry: Not on file    Inability: Not on file  . Transportation needs:    Medical: Not on file    Non-medical: Not on file  Tobacco Use  . Smoking status: Current Every Day Smoker    Packs/day: 1.00    Years: 52.00    Pack years: 52.00    Types: Cigarettes  . Smokeless tobacco: Never Used  . Tobacco  comment: Currently on Chantix  Substance and Sexual Activity  . Alcohol use: No  . Drug use: No  . Sexual activity: Not on file  Lifestyle  . Physical activity:    Days per week: Not on file    Minutes per session: Not on file  . Stress: Not on file  Relationships  . Social connections:    Talks on phone: Not on file    Gets together: Not on file    Attends religious service: Not on file    Active member of club or organization: Not on file    Attends meetings of clubs or organizations: Not on file    Relationship status: Not on file  Other Topics Concern  . Not on file  Social History Narrative  . Not on file   Past Surgical History:  Procedure Laterality Date  . ANTERIOR CERVICAL DECOMP/DISCECTOMY FUSION N/A 12/18/2012   Procedure: ANTERIOR CERVICAL DECOMPRESSION/DISCECTOMY FUSION 1 LEVEL;  Surgeon: Sinclair Ship, MD;  Location: [place];  Service: Orthopedics;  Laterality: N/A;  Anterior cervical decompression fusion, cervical 7 - thoracic 1 with instrumentation, allograft  . COLONOSCOPY    . INSERTION OF MESH Left 05/14/2014   Procedure: INSERTION OF MESH;  Surgeon: Donnie Mesa, MD;  Location: [place];  Service:  General;  Laterality: Left;  . NECK SURGERY    . SPIGELIAN HERNIA  05/14/2014  . VENTRAL HERNIA REPAIR Left 05/14/2014   Procedure: ATTEMPTED LAPAROSCOPIC REPAIR LEFT SPIGLIAN HERINA CONVERTED TO OPEN REPAIR;  Surgeon: Donnie Mesa, MD;  Location: [place];  Service: General;  Laterality: Left;   Past Medical History:  Diagnosis Date  . Aortic aneurysm ([place]) 2011   3.83mm  . Arthritis   . Back spasm   . Bulging discs   . Chronic back pain   . Depression   . Diabetes mellitus without complication (HCC)    fasting 90-110  . Emphysema   . Environmental and seasonal allergies   . Hyperlipidemia   . Hypertension   . Neuromuscular disorder ([place])   . Onycholysis of toenail   . Osteoporosis   . Ovarian cyst 06/01/2009   5 cm  . Restless legs   . Spigelian  hernia    left ventral  . Wears glasses    Pulse 69   Ht 4\' 9"  (1.448 m)   Wt 108 lb (49 kg)   SpO2 96%   BMI 23.37 kg/m   Opioid Risk Score:   Fall Risk Score:  `1  Depression screen PHQ 2/9  Depression screen [facility] 2/9 07/03/2017 07/05/2015  Decreased Interest 0 0  Down, Depressed, Hopeless 0 0  PHQ - 2 Score 0 0  Altered sleeping - 0  Tired, decreased energy - 0  Change in appetite - 0  Feeling bad or failure about yourself  - 0  Trouble concentrating - 0  Moving slowly or fidgety/restless - 0  Suicidal thoughts - 0  PHQ-9 Score - 0  Difficult doing work/chores - Not difficult at all     Review of Systems  Constitutional: Negative.   HENT: Negative.   Eyes: Negative.   Respiratory: Negative.   Cardiovascular: Negative.   Gastrointestinal: Positive for constipation.  Endocrine: Negative.   Genitourinary: Positive for difficulty urinating.  Musculoskeletal: Positive for arthralgias, back pain and myalgias.  Skin: Negative.   Allergic/Immunologic: Negative.   Neurological: Positive for numbness.  Hematological: Negative.   Psychiatric/Behavioral: Negative.   All other systems reviewed and are negative.      Objective:   Physical Exam  Constitutional: She is oriented to person, place, and time. She appears well-developed and well-nourished.  HENT:  Head: Normocephalic and atraumatic.  Neck: Normal range of motion. Neck supple.  Cardiovascular: Normal rate and regular rhythm.  Pulmonary/Chest: Effort normal and breath sounds normal.  Musculoskeletal:  Normal Muscle Bulk and Muscle Testing Reveals: Upper Extremities: Full ROM and Muscle Strength 5/5 Back without spinal tenderness Lower Extremities: Full ROM and Muscle Strength 5/5 Arises from Table with ease Narrow Based Gait  Neurological: She is alert and oriented to person, place, and time.  Skin: Skin is warm and dry.  Psychiatric: She has a normal mood and affect. Her behavior is normal.  Nursing note  and vitals reviewed.         Assessment & Plan:  1. 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 Radiculopathies at L5-S1. 01/02/2018 Refilled: Tramadol 50 mg one tablet every 8 hours as needed,no more than three a day. #90.  We will continue the opioid monitoring program, this consists of regular clinic visits, examinations, urine drug screen, as well as use of [place] Controlled Substance Reporting System. 2. Diabetic peripheral neuropathy: Refuses Gabapentin, continue to monitor, Continue Tight Glucose Control. Continue to Monitor.01/02/2018. 3. Chronic Bilateral Shoulder Pain:  No complaints Today. Continue current medication regimen. Alternate Heat and Ice Therapy.01/02/2018  20 minutes of face to face patient care time was spent during this visit. All questions were encouraged and answered.   F/U in 6 months

## 2018-01-09 ENCOUNTER — Telehealth: Payer: Self-pay

## 2018-01-09 NOTE — Telephone Encounter (Signed)
Pt called stating she misplaced her Tramadol prescription. I called pt to let her know she didn't get a prescription that is was electronically sent to the pharmacy.

## 2018-01-16 ENCOUNTER — Ambulatory Visit: Payer: Medicare Other | Admitting: Podiatry

## 2018-01-16 ENCOUNTER — Ambulatory Visit (INDEPENDENT_AMBULATORY_CARE_PROVIDER_SITE_OTHER): Payer: Medicare Other | Admitting: Podiatry

## 2018-01-16 ENCOUNTER — Encounter: Payer: Self-pay | Admitting: Podiatry

## 2018-01-16 DIAGNOSIS — M79675 Pain in left toe(s): Secondary | ICD-10-CM

## 2018-01-16 DIAGNOSIS — E1142 Type 2 diabetes mellitus with diabetic polyneuropathy: Secondary | ICD-10-CM | POA: Diagnosis not present

## 2018-01-16 DIAGNOSIS — B351 Tinea unguium: Secondary | ICD-10-CM | POA: Diagnosis not present

## 2018-01-16 DIAGNOSIS — M79674 Pain in right toe(s): Secondary | ICD-10-CM

## 2018-01-16 NOTE — Progress Notes (Signed)
Complaint:  Visit Type: Patient returns to my office for continued preventative foot care services. Complaint: Patient states" my nails have grown long and thick and become painful to walk and wear shoes" Patient has been diagnosed with DM with neuropathy.. The patient presents for preventative foot care services. No changes to ROS  Podiatric Exam: Vascular: dorsalis pedis and posterior tibial pulses are palpable bilateral. Capillary return is immediate. Temperature gradient is WNL. Skin turgor WNL  Sensorium: Normal Semmes Weinstein monofilament test. Normal tactile sensation bilaterally. Nail Exam: Pt has thick disfigured discolored nails with subungual debris noted bilateral entire nail hallux through fifth toenails Ulcer Exam: There is no evidence of ulcer or pre-ulcerative changes or infection. Orthopedic Exam: Muscle tone and strength are WNL. No limitations in general ROM. No crepitus or effusions noted. Foot type and digits show no abnormalities. Bony prominences are unremarkable. Skin: No Porokeratosis. No infection or ulcers.  Heloma durum fifth toe right foot.  Diagnosis:  Onychomycosis, , Pain in right toe, pain in left toes,  Heloma durum fifth right foot.  Treatment & Plan Procedures and Treatment: Consent by patient was obtained for treatment procedures.   Debridement of mycotic and hypertrophic toenails, 1 through 5 bilateral and clearing of subungual debris. No ulceration, no infection noted. [facility]  HD. Return Visit-Office Procedure: Patient instructed to return to the office for a follow up visit 3 months for continued evaluation and treatment.    Gardiner Barefoot DPM

## 2018-01-18 ENCOUNTER — Ambulatory Visit (INDEPENDENT_AMBULATORY_CARE_PROVIDER_SITE_OTHER)
Admission: RE | Admit: 2018-01-18 | Discharge: 2018-01-18 | Disposition: A | Discharge status: Home / Self Care | Payer: Medicare Other | Source: Ambulatory Visit | Attending: Acute Care | Admitting: Acute Care

## 2018-01-18 DIAGNOSIS — Z122 Encounter for screening for malignant neoplasm of respiratory organs: Secondary | ICD-10-CM

## 2018-01-18 DIAGNOSIS — F17210 Nicotine dependence, cigarettes, uncomplicated: Secondary | ICD-10-CM | POA: Diagnosis not present

## 2018-01-21 ENCOUNTER — Telehealth: Payer: Self-pay | Admitting: Acute Care

## 2018-01-21 DIAGNOSIS — Z122 Encounter for screening for malignant neoplasm of respiratory organs: Secondary | ICD-10-CM

## 2018-01-21 DIAGNOSIS — Z87891 Personal history of nicotine dependence: Secondary | ICD-10-CM

## 2018-01-21 DIAGNOSIS — F17210 Nicotine dependence, cigarettes, uncomplicated: Secondary | ICD-10-CM

## 2018-01-21 NOTE — Telephone Encounter (Signed)
LMTC x 1 to give Lung Screening CT results

## 2018-01-25 NOTE — Telephone Encounter (Signed)
Pt is aware that Kylie Hurst will return her call to relay results when she is back in office.  Routing to [place] to make aware.

## 2018-01-25 NOTE — Telephone Encounter (Signed)
Pt is calling back [phone]

## 2018-01-29 NOTE — Telephone Encounter (Signed)
LMTC x 1  

## 2018-02-01 ENCOUNTER — Encounter: Payer: Self-pay | Admitting: *Deleted

## 2018-02-01 NOTE — Telephone Encounter (Signed)
Letter mailed to pt with  CT results per Sarah Groce, NP.    Copy sent to PCP.  Order placed for 1 yr f/u CT. ° °

## 2018-04-17 ENCOUNTER — Encounter: Payer: Self-pay | Admitting: Podiatry

## 2018-04-17 ENCOUNTER — Other Ambulatory Visit: Payer: Self-pay

## 2018-04-17 ENCOUNTER — Ambulatory Visit (INDEPENDENT_AMBULATORY_CARE_PROVIDER_SITE_OTHER): Payer: Medicare Other | Admitting: Podiatry

## 2018-04-17 DIAGNOSIS — M79675 Pain in left toe(s): Secondary | ICD-10-CM | POA: Diagnosis not present

## 2018-04-17 DIAGNOSIS — M79674 Pain in right toe(s): Secondary | ICD-10-CM | POA: Diagnosis not present

## 2018-04-17 DIAGNOSIS — B351 Tinea unguium: Secondary | ICD-10-CM

## 2018-04-17 DIAGNOSIS — E1142 Type 2 diabetes mellitus with diabetic polyneuropathy: Secondary | ICD-10-CM | POA: Diagnosis not present

## 2018-04-17 DIAGNOSIS — L84 Corns and callosities: Secondary | ICD-10-CM | POA: Diagnosis not present

## 2018-04-17 NOTE — Progress Notes (Signed)
Complaint:  Visit Type: Patient returns to my office for continued preventative foot care services. Complaint: Patient states" my nails have grown long and thick and become painful to walk and wear shoes" Patient has been diagnosed with DM with neuropathy.. The patient presents for preventative foot care services. No changes to ROS  Podiatric Exam: Vascular: dorsalis pedis and posterior tibial pulses are palpable bilateral. Capillary return is immediate. Temperature gradient is WNL. Skin turgor WNL  Sensorium: Normal Semmes Weinstein monofilament test. Normal tactile sensation bilaterally. Nail Exam: Pt has thick disfigured discolored nails with subungual debris noted bilateral entire nail hallux through fifth toenails Ulcer Exam: There is no evidence of ulcer or pre-ulcerative changes or infection. Orthopedic Exam: Muscle tone and strength are WNL. No limitations in general ROM. No crepitus or effusions noted. Foot type and digits show no abnormalities. Bony prominences are unremarkable. Skin: No Porokeratosis. No infection or ulcers.  Heloma durum fifth toe right foot.  Diagnosis:  Onychomycosis, , Pain in right toe, pain in left toes,  Heloma durum fifth right foot.  Treatment & Plan Procedures and Treatment: Consent by patient was obtained for treatment procedures.   Debridement of mycotic and hypertrophic toenails, 1 through 5 bilateral and clearing of subungual debris. No ulceration, no infection noted. [facility]  HD. Return Visit-Office Procedure: Patient instructed to return to the office for a follow up visit 3 months for continued evaluation and treatment.    Gardiner Barefoot DPM

## 2018-07-02 ENCOUNTER — Encounter: Payer: Self-pay | Admitting: Physical Medicine & Rehabilitation

## 2018-07-02 ENCOUNTER — Other Ambulatory Visit: Payer: Self-pay

## 2018-07-02 ENCOUNTER — Encounter: Payer: Medicare Other | Attending: Physical Medicine & Rehabilitation | Admitting: Physical Medicine & Rehabilitation

## 2018-07-02 VITALS — BP 97/62 | HR 80 | Temp 98.7°F | Ht <= 58 in | Wt 109.0 lb

## 2018-07-02 DIAGNOSIS — E1142 Type 2 diabetes mellitus with diabetic polyneuropathy: Secondary | ICD-10-CM | POA: Diagnosis present

## 2018-07-02 DIAGNOSIS — M21371 Foot drop, right foot: Secondary | ICD-10-CM | POA: Diagnosis present

## 2018-07-02 DIAGNOSIS — G894 Chronic pain syndrome: Secondary | ICD-10-CM | POA: Insufficient documentation

## 2018-07-02 DIAGNOSIS — Z5181 Encounter for therapeutic drug level monitoring: Secondary | ICD-10-CM | POA: Diagnosis present

## 2018-07-02 DIAGNOSIS — M4317 Spondylolisthesis, lumbosacral region: Secondary | ICD-10-CM | POA: Insufficient documentation

## 2018-07-02 DIAGNOSIS — M5416 Radiculopathy, lumbar region: Secondary | ICD-10-CM | POA: Diagnosis present

## 2018-07-02 MED ORDER — TIZANIDINE HCL 2 MG PO TABS: 2.0000 mg | ORAL_TABLET | Freq: Two times a day (BID) | ORAL | 1 refills | Status: DC

## 2018-07-02 MED ORDER — TRAMADOL HCL 50 MG PO TABS: 50.0000 mg | ORAL_TABLET | Freq: Three times a day (TID) | ORAL | 5 refills | Status: DC | PRN

## 2018-07-02 NOTE — Progress Notes (Signed)
Subjective:    Patient ID: Kylie Hurst, female    DOB: 20-Aug-1945, 73 y.o.   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  Has seen spine surgeon in past but as not considered a good surgical candidate   Did not take tramadol today, usually takes 3 per day   Doing a lot of yard work, gardening Pain Inventory Average Pain 7 Pain Right Now 8 My pain is tingling and aching  In the last 24 hours, has pain interfered with the following? General activity 5 Relation with others 3 Enjoyment of life 5 What TIME of day is your pain at its worst? evening and night Sleep (in general) Fair  Pain is worse with: walking, sitting and standing Pain improves with: rest and medication Relief from Meds: 7  Mobility walk without assistance how many minutes can you walk? 10 ability to climb steps?  yes do you drive?  yes  Function retired  Neuro/Psych bladder control problems weakness numbness tingling trouble walking spasms  Prior Studies Any changes since last visit?  no  Physicians involved in your care Any changes since last visit?  no   Family History  Problem Relation Age of Onset  . Diabetes Mother   . Cancer Sister   . Epilepsy Brother    Social History   Socioeconomic History  . Marital status: Divorced    Spouse name: Not on file  . Number of children: Not on file  . Years of education: Not on file  . Highest education level: Not on file  Occupational History  . Not on file  Social Needs  . Financial resource strain: Not on file  . Food insecurity:    Worry: Not on file    Inability: Not on file  . Transportation needs:    Medical: Not on file    Non-medical: Not on file  Tobacco Use  . Smoking status: Current  Every Day Smoker    Packs/day: 1.00    Years: 52.00    Pack years: 52.00    Types: Cigarettes  . Smokeless tobacco: Never Used  . Tobacco comment: Currently on Chantix  Substance and Sexual Activity  . Alcohol use: No  . Drug use: No  . Sexual activity: Not on file  Lifestyle  . Physical activity:    Days per week: Not on file    Minutes per session: Not on file  . Stress: Not on file  Relationships  . Social connections:    Talks on phone: Not on file    Gets together: Not on file    Attends religious service: Not on file    Active member of club or organization: Not on file    Attends meetings of clubs or organizations: Not on file    Relationship status: Not on file  Other Topics Concern  . Not on file  Social History Narrative  . Not on file   Past Surgical History:  Procedure Laterality Date  . ANTERIOR CERVICAL DECOMP/DISCECTOMY FUSION N/A 12/18/2012   Procedure: ANTERIOR CERVICAL DECOMPRESSION/DISCECTOMY FUSION 1 LEVEL;  Surgeon: Sinclair Ship, MD;  Location: [place];  Service: Orthopedics;  Laterality: N/A;  Anterior cervical decompression fusion, cervical 7 - thoracic 1  with instrumentation, allograft  . COLONOSCOPY    . INSERTION OF MESH Left 05/14/2014   Procedure: INSERTION OF MESH;  Surgeon: Donnie Mesa, MD;  Location: [place];  Service: General;  Laterality: Left;  . NECK SURGERY    . SPIGELIAN HERNIA  05/14/2014  . VENTRAL HERNIA REPAIR Left 05/14/2014   Procedure: ATTEMPTED LAPAROSCOPIC REPAIR LEFT SPIGLIAN HERINA CONVERTED TO OPEN REPAIR;  Surgeon: Donnie Mesa, MD;  Location: [place] [facility];  Service: General;  Laterality: Left;   Past Medical History:  Diagnosis Date  . Aortic aneurysm ([place]) 2011   3.42mm  . Arthritis   . Back spasm   . Bulging discs   . Chronic back pain   . Depression   . Diabetes mellitus without complication (HCC)    fasting 90-110  . Emphysema   . Environmental and seasonal allergies   . Hyperlipidemia   . Hypertension   .  Neuromuscular disorder ([place])   . Onycholysis of toenail   . Osteoporosis   . Ovarian cyst 06/01/2009   5 cm  . Restless legs   . Spigelian hernia    left ventral  . Wears glasses    BP 97/62   Pulse 80   Temp 98.7 F (37.1 C)   Ht 4\' 9"  (1.448 m)   Wt 109 lb (49.4 kg)   SpO2 98%   BMI 23.59 kg/m   Opioid Risk Score:   Fall Risk Score:  `1  Depression screen PHQ 2/9  Depression screen [facility] 2/9 07/02/2018 07/03/2017 07/05/2015  Decreased Interest 0 0 0  Down, Depressed, Hopeless 0 0 0  PHQ - 2 Score 0 0 0  Altered sleeping - - 0  Tired, decreased energy - - 0  Change in appetite - - 0  Feeling bad or failure about yourself  - - 0  Trouble concentrating - - 0  Moving slowly or fidgety/restless - - 0  Suicidal thoughts - - 0  PHQ-9 Score - - 0  Difficult doing work/chores - - Not difficult at all    Review of Systems  Constitutional: Negative.   HENT: Negative.   Eyes: Negative.   Respiratory: Positive for cough, shortness of breath and wheezing.   Cardiovascular: Negative.   Gastrointestinal: Positive for constipation.  Endocrine: Negative.   Genitourinary: Negative.   Musculoskeletal: Negative.   Skin: Negative.   Allergic/Immunologic: Negative.   Neurological: Negative.   Hematological: Negative.   Psychiatric/Behavioral: Negative.   All other systems reviewed and are negative.      Objective:   Physical Exam Vitals signs and nursing note reviewed.  Constitutional:      Appearance: Normal appearance.  HENT:     Head: Normocephalic and atraumatic.     Nose: Nose normal.     Mouth/Throat:     Pharynx: Oropharynx is clear.  Eyes:     Extraocular Movements: Extraocular movements intact.     Conjunctiva/sclera: Conjunctivae normal.     Pupils: Pupils are equal, round, and reactive to light.  Neurological:     Mental Status: She is alert and oriented to person, place, and time.     Cranial Nerves: No dysarthria or facial asymmetry.     Sensory: Sensory  deficit present.     Motor: Weakness and atrophy present. No tremor or abnormal muscle tone.     Coordination: Coordination is intact.     Comments: Reduced sensation to monofilament toes of both fet  Motor 5/5 in bilateral HF, KE, Left ankle DF  3- Right ankle DF  Unable to heel walk on the right side  Psychiatric:        Mood and Affect: Mood normal.        Behavior: Behavior normal.           Assessment & Plan:  1.  L5-S1 spondylolisthesis with Right L5 radiculopathy and chronic acquired R foot drop. Rec for AFO right , pt will ask her podiatrist if he has these in office otherwise an Rx for R AFO, [facility] rx pad written  Discussed RLE is radicular and may trial gabapentin or try ESI.  Pt does not want to consider either option.  She was told she is not a good surgical candidate (likely d/t COPD)  Cont tramadol 50mg  TID Cont tizanidine 2mg  TID RTC 40mo with NP Needs oral swab today

## 2018-07-02 NOTE — Addendum Note (Signed)
Addended by: Marland Mcalpine B on: 07/02/2018 01:27 PM   Modules accepted: Orders

## 2018-07-02 NOTE — Patient Instructions (Signed)
Please ask foot dr if he has an ankle brace to prevent foot drop.  If not call [facility] and bring my prescription to them

## 2018-07-05 LAB — DRUG TOX MONITOR 1 W/CONF, ORAL FLD
Amphetamines: NEGATIVE ng/mL (ref <10)
Barbiturates: NEGATIVE ng/mL (ref <10)
Benzodiazepines: NEGATIVE ng/mL (ref <0.50)
Buprenorphine: NEGATIVE ng/mL (ref <0.10)
Cocaine: NEGATIVE ng/mL (ref <5.0)
Cotinine: >250 ng/mL — ABNORMAL HIGH (ref <5.0)
Fentanyl: NEGATIVE ng/mL (ref <0.10)
Heroin Metabolite: NEGATIVE ng/mL (ref <1.0)
MARIJUANA: NEGATIVE ng/mL (ref <2.5)
MDMA: NEGATIVE ng/mL (ref <10)
Meprobamate: NEGATIVE ng/mL (ref <2.5)
Methadone: NEGATIVE ng/mL (ref <5.0)
Nicotine Metabolite: POSITIVE ng/mL — AB (ref <5.0)
Opiates: NEGATIVE ng/mL (ref <2.5)
Phencyclidine: NEGATIVE ng/mL (ref <10)
Tapentadol: NEGATIVE ng/mL (ref <5.0)
Tramadol: >500 ng/mL — ABNORMAL HIGH (ref <5.0)
Tramadol: POSITIVE ng/mL — AB (ref <5.0)
Zolpidem: NEGATIVE ng/mL (ref <5.0)

## 2018-07-05 LAB — DRUG TOX ALC METAB W/CON, ORAL FLD: Alcohol Metabolite: NEGATIVE ng/mL (ref <25)

## 2018-07-08 ENCOUNTER — Telehealth: Payer: Self-pay | Admitting: *Deleted

## 2018-07-08 NOTE — Telephone Encounter (Signed)
Oral swab drug screen was consistent for prescribed medications.  ?

## 2018-07-17 ENCOUNTER — Ambulatory Visit (INDEPENDENT_AMBULATORY_CARE_PROVIDER_SITE_OTHER): Payer: Medicare Other | Admitting: Podiatry

## 2018-07-17 ENCOUNTER — Other Ambulatory Visit: Payer: Self-pay

## 2018-07-17 ENCOUNTER — Encounter: Payer: Self-pay | Admitting: Podiatry

## 2018-07-17 DIAGNOSIS — B351 Tinea unguium: Secondary | ICD-10-CM

## 2018-07-17 DIAGNOSIS — E119 Type 2 diabetes mellitus without complications: Secondary | ICD-10-CM

## 2018-07-17 DIAGNOSIS — M79675 Pain in left toe(s): Secondary | ICD-10-CM | POA: Diagnosis not present

## 2018-07-17 DIAGNOSIS — M79674 Pain in right toe(s): Secondary | ICD-10-CM

## 2018-07-17 HISTORY — DX: Type 2 diabetes mellitus without complications: E11.9

## 2018-07-17 HISTORY — DX: Pain in right toe(s): B35.1

## 2018-07-17 NOTE — Progress Notes (Signed)
Complaint:  Visit Type: Patient returns to my office for continued preventative foot care services. Complaint: Patient states" my nails have grown long and thick and become painful to walk and wear shoes" Patient has been diagnosed with DM with neuropathy.. The patient presents for preventative foot care services. No changes to ROS  Podiatric Exam: Vascular: dorsalis pedis and posterior tibial pulses are palpable bilateral. Capillary return is immediate. Temperature gradient is WNL. Skin turgor WNL  Sensorium: Normal Semmes Weinstein monofilament test. Normal tactile sensation bilaterally. Nail Exam: Pt has thick disfigured discolored nails with subungual debris noted bilateral entire nail hallux through fifth toenails Ulcer Exam: There is no evidence of ulcer or pre-ulcerative changes or infection. Orthopedic Exam: Muscle tone and strength are WNL. No limitations in general ROM. No crepitus or effusions noted. Foot type and digits show no abnormalities. Bony prominences are unremarkable. Skin: No Porokeratosis. No infection or ulcers.    Diagnosis:  Onychomycosis, , Pain in right toe, pain in left toes,   Treatment & Plan Procedures and Treatment: Consent by patient was obtained for treatment procedures.   Debridement of mycotic and hypertrophic toenails, 1 through 5 bilateral and clearing of subungual debris. No ulceration, no infection noted. [facility]  HD. Return Visit-Office Procedure: Patient instructed to return to the office for a follow up visit 3 months for continued evaluation and treatment.    Gardiner Barefoot DPM

## 2018-07-19 ENCOUNTER — Telehealth: Payer: Self-pay | Admitting: *Deleted

## 2018-07-19 NOTE — Telephone Encounter (Addendum)
Kylie Hurst called about her foot.  She has seen her foot doctor and the man who does the splints was not there but was in [place], so her appointment with him is not until next Thursday. She was calling about uncertainty if her insurance would cover him making it or [facility].  I have advised her to call Monday to see if their office could give her that information or not. She has the Rx for [facility] if there is a problem.

## 2018-07-22 ENCOUNTER — Telehealth: Payer: Self-pay | Admitting: Podiatry

## 2018-07-22 ENCOUNTER — Telehealth: Payer: Self-pay | Admitting: Physical Medicine & Rehabilitation

## 2018-07-22 NOTE — Telephone Encounter (Signed)
This was handled on Friday. See previous call note.

## 2018-07-22 NOTE — Telephone Encounter (Signed)
Patient returned phone call to our office.  Please call patient back.

## 2018-07-22 NOTE — Telephone Encounter (Signed)
Pt called ans was scheduled to see Liliane Channel for possible drop foot brace/orthotics and asked if insurance would cover.Pt has [facility] and medicaid.I explained that medicare would cover 80% but medicaid does not cover DME in our office so she would have a balance.She stated she could not pay this and cxled the appt but will call if later she is able to afford the 20% coinsurance.

## 2018-07-25 ENCOUNTER — Other Ambulatory Visit: Payer: Medicare Other | Admitting: Orthotics

## 2018-10-17 ENCOUNTER — Other Ambulatory Visit: Payer: Self-pay | Admitting: Family Medicine

## 2018-10-17 DIAGNOSIS — Z1231 Encounter for screening mammogram for malignant neoplasm of breast: Secondary | ICD-10-CM

## 2018-10-23 ENCOUNTER — Encounter: Payer: Self-pay | Admitting: Podiatry

## 2018-10-23 ENCOUNTER — Ambulatory Visit (INDEPENDENT_AMBULATORY_CARE_PROVIDER_SITE_OTHER): Payer: Medicare Other | Admitting: Podiatry

## 2018-10-23 ENCOUNTER — Other Ambulatory Visit: Payer: Self-pay

## 2018-10-23 DIAGNOSIS — B351 Tinea unguium: Secondary | ICD-10-CM | POA: Diagnosis not present

## 2018-10-23 DIAGNOSIS — M79675 Pain in left toe(s): Secondary | ICD-10-CM

## 2018-10-23 DIAGNOSIS — E119 Type 2 diabetes mellitus without complications: Secondary | ICD-10-CM | POA: Diagnosis not present

## 2018-10-23 DIAGNOSIS — M79674 Pain in right toe(s): Secondary | ICD-10-CM

## 2018-10-23 NOTE — Progress Notes (Signed)
Complaint:  Visit Type: Patient returns to my office for continued preventative foot care services. Complaint: Patient states" my nails have grown long and thick and become painful to walk and wear shoes" Patient has been diagnosed with DM with neuropathy.. The patient presents for preventative foot care services. No changes to ROS  Podiatric Exam: Vascular: dorsalis pedis and posterior tibial pulses are palpable bilateral. Capillary return is immediate. Temperature gradient is WNL. Skin turgor WNL  Sensorium: Normal Semmes Weinstein monofilament test. Normal tactile sensation bilaterally. Nail Exam: Pt has thick disfigured discolored nails with subungual debris noted bilateral entire nail hallux through fifth toenails Ulcer Exam: There is no evidence of ulcer or pre-ulcerative changes or infection. Orthopedic Exam: Muscle tone and strength are WNL. No limitations in general ROM. No crepitus or effusions noted. Foot type and digits show no abnormalities. Bony prominences are unremarkable. Skin: No Porokeratosis. No infection or ulcers.    Diagnosis:  Onychomycosis, , Pain in right toe, pain in left toes,   Treatment & Plan Procedures and Treatment: Consent by patient was obtained for treatment procedures.   Debridement of mycotic and hypertrophic toenails, 1 through 5 bilateral and clearing of subungual debris. No ulceration, no infection noted. Debride  HD. Return Visit-Office Procedure: Patient instructed to return to the office for a follow up visit 3 months for continued evaluation and treatment.    Olivya Sobol DPM 

## 2018-12-05 ENCOUNTER — Ambulatory Visit
Admission: RE | Admit: 2018-12-05 | Discharge: 2018-12-05 | Disposition: A | Discharge status: Home / Self Care | Payer: Medicare Other | Source: Ambulatory Visit | Attending: Family Medicine | Admitting: Family Medicine

## 2018-12-05 ENCOUNTER — Other Ambulatory Visit: Payer: Self-pay

## 2018-12-05 DIAGNOSIS — Z1231 Encounter for screening mammogram for malignant neoplasm of breast: Secondary | ICD-10-CM

## 2019-01-16 ENCOUNTER — Encounter: Payer: Medicare Other | Attending: Registered Nurse | Admitting: Registered Nurse

## 2019-01-16 ENCOUNTER — Other Ambulatory Visit: Payer: Self-pay

## 2019-01-16 ENCOUNTER — Encounter: Payer: Self-pay | Admitting: Registered Nurse

## 2019-01-16 VITALS — BP 114/76 | HR 74 | Temp 97.7°F | Ht <= 58 in | Wt 118.0 lb

## 2019-01-16 DIAGNOSIS — M4317 Spondylolisthesis, lumbosacral region: Secondary | ICD-10-CM | POA: Insufficient documentation

## 2019-01-16 DIAGNOSIS — Z5181 Encounter for therapeutic drug level monitoring: Secondary | ICD-10-CM | POA: Diagnosis present

## 2019-01-16 DIAGNOSIS — G894 Chronic pain syndrome: Secondary | ICD-10-CM | POA: Diagnosis present

## 2019-01-16 DIAGNOSIS — M21371 Foot drop, right foot: Secondary | ICD-10-CM | POA: Insufficient documentation

## 2019-01-16 DIAGNOSIS — E1142 Type 2 diabetes mellitus with diabetic polyneuropathy: Secondary | ICD-10-CM | POA: Insufficient documentation

## 2019-01-16 DIAGNOSIS — Z79899 Other long term (current) drug therapy: Secondary | ICD-10-CM | POA: Diagnosis present

## 2019-01-16 MED ORDER — TIZANIDINE HCL 2 MG PO TABS: 2.0000 mg | ORAL_TABLET | Freq: Two times a day (BID) | ORAL | 1 refills | Status: DC

## 2019-01-16 MED ORDER — TRAMADOL HCL 50 MG PO TABS: 50.0000 mg | ORAL_TABLET | Freq: Three times a day (TID) | ORAL | 5 refills | Status: DC | PRN

## 2019-01-16 NOTE — Progress Notes (Signed)
Subjective:    Patient ID: Kylie Hurst, female    DOB: Jun 18, 1945, 73 y.o.   MRN: [id number]  HPI: Kylie Hurst is a 73 y.o. female who returns for follow up appointment for chronic pain and medication refill. She states her pain is located in her lower back and bilateral feet with numbness. She rates her  Pain 7. Her  current exercise regime is walking and performing stretching exercises.  Kylie Hurst Morphine equivalent is 15.00 MME.  Last Oral Swab was Performed on 07/02/2018, it was consistent.   Pain Inventory Average Pain 8 Pain Right Now 7 My pain is sharp and tingling  In the last 24 hours, has pain interfered with the following? General activity 2 Relation with others 0 Enjoyment of life 2 What TIME of day is your pain at its worst? night Sleep (in general) Fair  Pain is worse with: bending and sitting Pain improves with: rest and medication Relief from Meds: 9  Mobility walk without assistance how many minutes can you walk? 10 ability to climb steps?  yes do you drive?  yes  Function retired  Neuro/Psych bladder control problems bowel control problems weakness numbness tingling spasms  Prior Studies Any changes since last visit?  yes x-rays  Physicians involved in your care Any changes since last visit?  no   Family History  Problem Relation Age of Onset  . Diabetes Mother   . Cancer Sister   . Epilepsy Brother    Social History   Socioeconomic History  . Marital status: Divorced    Spouse name: Not on file  . Number of children: Not on file  . Years of education: Not on file  . Highest education level: Not on file  Occupational History  . Not on file  Tobacco Use  . Smoking status: Current Every Day Smoker    Packs/day: 1.00    Years: 52.00    Pack years: 52.00    Types: Cigarettes  . Smokeless tobacco: Never Used  . Tobacco comment: Currently on Chantix  Substance and Sexual Activity  . Alcohol use: No  . Drug use: No    . Sexual activity: Not on file  Other Topics Concern  . Not on file  Social History Narrative  . Not on file   Social Determinants of Health   Financial Resource Strain:   . Difficulty of Paying Living Expenses: Not on file  Food Insecurity:   . Worried About Charity fundraiser in the Last Year: Not on file  . Ran Out of Food in the Last Year: Not on file  Transportation Needs:   . Lack of Transportation (Medical): Not on file  . Lack of Transportation (Non-Medical): Not on file  Physical Activity:   . Days of Exercise per Week: Not on file  . Minutes of Exercise per Session: Not on file  Stress:   . Feeling of Stress : Not on file  Social Connections:   . Frequency of Communication with Friends and Family: Not on file  . Frequency of Social Gatherings with Friends and Family: Not on file  . Attends Religious Services: Not on file  . Active Member of Clubs or Organizations: Not on file  . Attends Archivist Meetings: Not on file  . Marital Status: Not on file   Past Surgical History:  Procedure Laterality Date  . ANTERIOR CERVICAL DECOMP/DISCECTOMY FUSION N/A 12/18/2012   Procedure: ANTERIOR CERVICAL DECOMPRESSION/DISCECTOMY FUSION 1 LEVEL;  Surgeon:  Sinclair Ship, MD;  Location: [place];  Service: Orthopedics;  Laterality: N/A;  Anterior cervical decompression fusion, cervical 7 - thoracic 1 with instrumentation, allograft  . COLONOSCOPY    . INSERTION OF MESH Left 05/14/2014   Procedure: INSERTION OF MESH;  Surgeon: Donnie Mesa, MD;  Location: [place];  Service: General;  Laterality: Left;  . NECK SURGERY    . SPIGELIAN HERNIA  05/14/2014  . VENTRAL HERNIA REPAIR Left 05/14/2014   Procedure: ATTEMPTED LAPAROSCOPIC REPAIR LEFT SPIGLIAN HERINA CONVERTED TO OPEN REPAIR;  Surgeon: Donnie Mesa, MD;  Location: [place];  Service: General;  Laterality: Left;   Past Medical History:  Diagnosis Date  . Aortic aneurysm ([place]) 2011   3.51mm  . Arthritis   . Back  spasm   . Bulging discs   . Chronic back pain   . Depression   . Diabetes mellitus without complication (HCC)    fasting 90-110  . Emphysema   . Environmental and seasonal allergies   . Hyperlipidemia   . Hypertension   . Neuromuscular disorder ([place])   . Onycholysis of toenail   . Osteoporosis   . Ovarian cyst 06/01/2009   5 cm  . Restless legs   . Spigelian hernia    left ventral  . Wears glasses    BP 114/76   Pulse 74   Temp 97.7 F (36.5 C)   Ht 4\' 9"  (1.448 m)   Wt 118 lb (53.5 kg)   SpO2 94%   BMI 25.53 kg/m   Opioid Risk Score:   Fall Risk Score:  `1  Depression screen PHQ 2/9  Depression screen [facility] 2/9 07/02/2018 07/03/2017 07/05/2015  Decreased Interest 0 0 0  Down, Depressed, Hopeless 0 0 0  PHQ - 2 Score 0 0 0  Altered sleeping - - 0  Tired, decreased energy - - 0  Change in appetite - - 0  Feeling bad or failure about yourself  - - 0  Trouble concentrating - - 0  Moving slowly or fidgety/restless - - 0  Suicidal thoughts - - 0  PHQ-9 Score - - 0  Difficult doing work/chores - - Not difficult at all    Review of Systems     Objective:   Physical Exam Vitals and nursing note reviewed.  Constitutional:      Appearance: Normal appearance.  Cardiovascular:     Rate and Rhythm: Normal rate and regular rhythm.     Pulses: Normal pulses.     Heart sounds: Normal heart sounds.  Musculoskeletal:     Cervical back: Normal range of motion and neck supple.     Comments: Normal Muscle Bulk and Muscle Testing Reveals:  Upper Extremities: Full ROM and Muscle Strength 5/5  Lower Extremities: Full ROM and Muscle Strength 5/5 Arises from chair with ease Narrow Based  Gait   Skin:    General: Skin is warm and dry.  Neurological:     Mental Status: She is alert and oriented to person, place, and time.  Psychiatric:        Mood and Affect: Mood normal.        Behavior: Behavior normal.           Assessment & Plan:  1. Chronic lumbar radiculopathy in a  patient with spondylolisthesis L5-S1. Radiculopathies at L5-S1. 01/16/2019 Refilled: Tramadol 50 mg one tablet every 8hours as needed,no more than three a day. #90.  We will continue the opioid monitoring program, this consists of regular  clinic visits, examinations, urine drug screen, as well as use of [place] Controlled Substance Reporting System. 2. Diabetic peripheral neuropathy: RefusesGabapentin, continue to monitor, Continue Tight Glucose Control. Continue to Monitor.01/16/2019. 3. Chronic Bilateral Shoulder Pain: No complaints Today. Continue current medication regimen. Alternate Heat and Ice Therapy.01/16/2019. 4. Acquired Right Foot Drop: Awaiting AFO from [facility].   15 minutes of face to face patient care time was spent during this visit. All questions were encouraged and answered.   F/U in 6 months

## 2019-01-22 ENCOUNTER — Encounter: Payer: Self-pay | Admitting: Podiatry

## 2019-01-22 ENCOUNTER — Ambulatory Visit (INDEPENDENT_AMBULATORY_CARE_PROVIDER_SITE_OTHER): Payer: Medicare Other | Admitting: Podiatry

## 2019-01-22 ENCOUNTER — Other Ambulatory Visit: Payer: Self-pay

## 2019-01-22 DIAGNOSIS — E119 Type 2 diabetes mellitus without complications: Secondary | ICD-10-CM | POA: Diagnosis not present

## 2019-01-22 DIAGNOSIS — M79675 Pain in left toe(s): Secondary | ICD-10-CM | POA: Diagnosis not present

## 2019-01-22 DIAGNOSIS — B351 Tinea unguium: Secondary | ICD-10-CM | POA: Diagnosis not present

## 2019-01-22 DIAGNOSIS — M79674 Pain in right toe(s): Secondary | ICD-10-CM | POA: Diagnosis not present

## 2019-01-22 NOTE — Progress Notes (Signed)
Complaint:  Visit Type: Patient returns to my office for continued preventative foot care services. Complaint: Patient states" my nails have grown long and thick and become painful to walk and wear shoes" Patient has been diagnosed with DM with neuropathy.. The patient presents for preventative foot care services. No changes to ROS  Podiatric Exam: Vascular: dorsalis pedis and posterior tibial pulses are palpable bilateral. Capillary return is immediate. Temperature gradient is WNL. Skin turgor WNL  Sensorium: Normal Semmes Weinstein monofilament test. Normal tactile sensation bilaterally. Nail Exam: Pt has thick disfigured discolored nails with subungual debris noted bilateral entire nail hallux through fifth toenails Ulcer Exam: There is no evidence of ulcer or pre-ulcerative changes or infection. Orthopedic Exam: Muscle tone and strength are WNL. No limitations in general ROM. No crepitus or effusions noted. Foot type and digits show no abnormalities. Bony prominences are unremarkable. Skin: No Porokeratosis. No infection or ulcers.    Diagnosis:  Onychomycosis, , Pain in right toe, pain in left toes,   Treatment & Plan Procedures and Treatment: Consent by patient was obtained for treatment procedures.   Debridement of mycotic and hypertrophic toenails, 1 through 5 bilateral and clearing of subungual debris. No ulceration, no infection noted. Debride  HD with dremel. Return Visit-Office Procedure: Patient instructed to return to the office for a follow up visit 3 months for continued evaluation and treatment.    Gardiner Barefoot DPM

## 2019-01-28 ENCOUNTER — Ambulatory Visit
Admission: RE | Admit: 2019-01-28 | Discharge: 2019-01-28 | Disposition: A | Discharge status: Home / Self Care | Payer: Medicare Other | Source: Ambulatory Visit | Attending: Acute Care | Admitting: Acute Care

## 2019-01-28 DIAGNOSIS — Z122 Encounter for screening for malignant neoplasm of respiratory organs: Secondary | ICD-10-CM

## 2019-01-28 DIAGNOSIS — Z87891 Personal history of nicotine dependence: Secondary | ICD-10-CM

## 2019-01-28 DIAGNOSIS — F17210 Nicotine dependence, cigarettes, uncomplicated: Secondary | ICD-10-CM

## 2019-02-03 ENCOUNTER — Other Ambulatory Visit: Payer: Self-pay | Admitting: *Deleted

## 2019-02-03 DIAGNOSIS — Z87891 Personal history of nicotine dependence: Secondary | ICD-10-CM

## 2019-02-03 DIAGNOSIS — F17210 Nicotine dependence, cigarettes, uncomplicated: Secondary | ICD-10-CM

## 2019-04-23 ENCOUNTER — Other Ambulatory Visit: Payer: Self-pay

## 2019-04-23 ENCOUNTER — Encounter: Payer: Self-pay | Admitting: Podiatry

## 2019-04-23 ENCOUNTER — Ambulatory Visit (INDEPENDENT_AMBULATORY_CARE_PROVIDER_SITE_OTHER): Payer: Medicare Other | Admitting: Podiatry

## 2019-04-23 VITALS — Temp 97.9°F

## 2019-04-23 DIAGNOSIS — E1142 Type 2 diabetes mellitus with diabetic polyneuropathy: Secondary | ICD-10-CM

## 2019-04-23 DIAGNOSIS — M79675 Pain in left toe(s): Secondary | ICD-10-CM

## 2019-04-23 DIAGNOSIS — M79674 Pain in right toe(s): Secondary | ICD-10-CM | POA: Diagnosis not present

## 2019-04-23 DIAGNOSIS — B351 Tinea unguium: Secondary | ICD-10-CM

## 2019-04-23 DIAGNOSIS — L84 Corns and callosities: Secondary | ICD-10-CM

## 2019-04-23 NOTE — Progress Notes (Signed)
This patient returns to my office for at risk foot care.  This patient requires this care by a professional since this patient will be at risk due to having diabetes with neuropathy.  This patient is unable to cut nails herself since the patient cannot reach her nails.These nails are painful walking and wearing shoes.  This patient presents for at risk foot care today.  General Appearance  Alert, conversant and in no acute stress.  Vascular  Dorsalis pedis and posterior tibial  pulses are palpable  bilaterally.  Capillary return is within normal limits  bilaterally. Temperature is within normal limits  bilaterally.  Neurologic  Senn-Weinstein monofilament wire test within normal limits  bilaterally. Muscle power within normal limits bilaterally.  Nails Thick disfigured discolored nails with subungual debris  from hallux to fifth toes bilaterally. No evidence of bacterial infection or drainage bilaterally.  Orthopedic  No limitations of motion  feet .  No crepitus or effusions noted.  No bony pathology or digital deformities noted.  Skin  normotropic skin with no porokeratosis noted bilaterally.  No signs of infections or ulcers noted.     Onychomycosis  Pain in right toes  Pain in left toes  Consent was obtained for treatment procedures.   Mechanical debridement of nails 1-5  bilaterally performed with a nail nipper.  Filed with dremel without incident. No infection or ulcer.     Return office visit   3 months                  Told patient to return for periodic foot care and evaluation due to potential at risk complications.   Iria Jamerson DPM  

## 2019-06-12 ENCOUNTER — Encounter: Payer: Medicare Other | Attending: Physical Medicine & Rehabilitation | Admitting: Physical Medicine & Rehabilitation

## 2019-06-12 ENCOUNTER — Encounter: Payer: Self-pay | Admitting: Physical Medicine & Rehabilitation

## 2019-06-12 ENCOUNTER — Other Ambulatory Visit: Payer: Self-pay

## 2019-06-12 VITALS — BP 136/76 | HR 78 | Temp 97.2°F | Ht <= 58 in | Wt 114.0 lb

## 2019-06-12 DIAGNOSIS — G894 Chronic pain syndrome: Secondary | ICD-10-CM

## 2019-06-12 DIAGNOSIS — M4317 Spondylolisthesis, lumbosacral region: Secondary | ICD-10-CM | POA: Diagnosis present

## 2019-06-12 DIAGNOSIS — M5416 Radiculopathy, lumbar region: Secondary | ICD-10-CM | POA: Diagnosis present

## 2019-06-12 DIAGNOSIS — Z5181 Encounter for therapeutic drug level monitoring: Secondary | ICD-10-CM

## 2019-06-12 DIAGNOSIS — Z79899 Other long term (current) drug therapy: Secondary | ICD-10-CM

## 2019-06-12 DIAGNOSIS — E0849 Diabetes mellitus due to underlying condition with other diabetic neurological complication: Secondary | ICD-10-CM

## 2019-06-12 MED ORDER — TRAMADOL HCL 50 MG PO TABS: 50.0000 mg | ORAL_TABLET | Freq: Three times a day (TID) | ORAL | 5 refills | Status: DC | PRN

## 2019-06-12 MED ORDER — TIZANIDINE HCL 2 MG PO TABS: 2.0000 mg | ORAL_TABLET | Freq: Two times a day (BID) | ORAL | 1 refills | Status: DC

## 2019-06-12 NOTE — Patient Instructions (Signed)

## 2019-06-12 NOTE — Progress Notes (Signed)
Subjective:    Patient ID: Kylie Hurst, female    DOB: 07/05/1945, 74 y.o.   MRN: [id number]  HPI Right foot drop, chronic but does not want to get a brace for this  Has foot pain radiating to legs, her last Hgb A1 C improved and her PCP stopped Metformin  The patient is thinking about getting on the virus vaccination and asked which vaccines have the highest incidence of blood clots.  Sees podiatry for toenail care  Pt has 100 plants , takes care of cats and birds Pain Inventory Average Pain 7 Pain Right Now 8 My pain is constant and tingling  In the last 24 hours, has pain interfered with the following? General activity 1 Relation with others 1 Enjoyment of life 0 What TIME of day is your pain at its worst? evening Sleep (in general) Fair  Pain is worse with: walking, bending, sitting and some activites Pain improves with: rest and medication Relief from Meds: 8  Mobility walk without assistance how many minutes can you walk? 10 ability to climb steps?  yes do you drive?  yes  Function retired  Neuro/Psych bladder control problems bowel control problems weakness numbness tingling dizziness  Prior Studies Any changes since last visit?  no  Physicians involved in your care Any changes since last visit?  no   Family History  Problem Relation Age of Onset  . Diabetes Mother   . Cancer Sister   . Epilepsy Brother    Social History   Socioeconomic History  . Marital status: Divorced    Spouse name: Not on file  . Number of children: Not on file  . Years of education: Not on file  . Highest education level: Not on file  Occupational History  . Not on file  Tobacco Use  . Smoking status: Current Every Day Smoker    Packs/day: 1.00    Years: 52.00    Pack years: 52.00    Types: Cigarettes  . Smokeless tobacco: Never Used  . Tobacco comment: Currently on Chantix  Substance and Sexual Activity  . Alcohol use: No  . Drug use: No  . Sexual  activity: Not on file  Other Topics Concern  . Not on file  Social History Narrative  . Not on file   Social Determinants of Health   Financial Resource Strain:   . Difficulty of Paying Living Expenses:   Food Insecurity:   . Worried About Charity fundraiser in the Last Year:   . Arboriculturist in the Last Year:   Transportation Needs:   . Film/video editor (Medical):   Marland Kitchen Lack of Transportation (Non-Medical):   Physical Activity:   . Days of Exercise per Week:   . Minutes of Exercise per Session:   Stress:   . Feeling of Stress :   Social Connections:   . Frequency of Communication with Friends and Family:   . Frequency of Social Gatherings with Friends and Family:   . Attends Religious Services:   . Active Member of Clubs or Organizations:   . Attends Archivist Meetings:   Marland Kitchen Marital Status:    Past Surgical History:  Procedure Laterality Date  . ANTERIOR CERVICAL DECOMP/DISCECTOMY FUSION N/A 12/18/2012   Procedure: ANTERIOR CERVICAL DECOMPRESSION/DISCECTOMY FUSION 1 LEVEL;  Surgeon: Sinclair Ship, MD;  Location: [facility];  Service: Orthopedics;  Laterality: N/A;  Anterior cervical decompression fusion, cervical 7 - thoracic 1 with instrumentation, allograft  .  COLONOSCOPY    . INSERTION OF MESH Left 05/14/2014   Procedure: INSERTION OF MESH;  Surgeon: Donnie Mesa, MD;  Location: [facility];  Service: General;  Laterality: Left;  . NECK SURGERY    . SPIGELIAN HERNIA  05/14/2014  . VENTRAL HERNIA REPAIR Left 05/14/2014   Procedure: ATTEMPTED LAPAROSCOPIC REPAIR LEFT SPIGLIAN HERINA CONVERTED TO OPEN REPAIR;  Surgeon: Donnie Mesa, MD;  Location: [facility];  Service: General;  Laterality: Left;   Past Medical History:  Diagnosis Date  . Aortic aneurysm ([place]) 2011   3.25mm  . Arthritis   . Back spasm   . Bulging discs   . Chronic back pain   . Depression   . Diabetes mellitus without complication (HCC)    fasting 90-110  . Emphysema   . Environmental and  seasonal allergies   . Hyperlipidemia   . Hypertension   . Neuromuscular disorder ([place])   . Onycholysis of toenail   . Osteoporosis   . Ovarian cyst 06/01/2009   5 cm  . Restless legs   . Spigelian hernia    left ventral  . Wears glasses    BP 136/76   Pulse 78   Temp (!) 97.2 F (36.2 C)   Ht 4\' 9"  (1.448 m)   Wt 114 lb (51.7 kg)   SpO2 91%   BMI 24.67 kg/m   Opioid Risk Score:   Fall Risk Score:  `1  Depression screen PHQ 2/9  Depression screen [facility] 2/9 06/12/2019 07/02/2018 07/03/2017 07/05/2015  Decreased Interest 0 0 0 0  Down, Depressed, Hopeless 0 0 0 0  PHQ - 2 Score 0 0 0 0  Altered sleeping - - - 0  Tired, decreased energy - - - 0  Change in appetite - - - 0  Feeling bad or failure about yourself  - - - 0  Trouble concentrating - - - 0  Moving slowly or fidgety/restless - - - 0  Suicidal thoughts - - - 0  PHQ-9 Score - - - 0  Difficult doing work/chores - - - Not difficult at all    Review of Systems  Constitutional: Negative.   HENT: Negative.   Eyes: Negative.   Respiratory: Positive for shortness of breath.   Cardiovascular: Negative.   Gastrointestinal: Positive for constipation.  Endocrine: Negative.   Genitourinary: Negative.   Musculoskeletal:       Spasms  Skin: Negative.   Allergic/Immunologic: Negative.   Neurological: Positive for weakness and numbness.       Tingling  Hematological: Negative.   Psychiatric/Behavioral: Negative.   All other systems reviewed and are negative.      Objective:   Physical Exam Vitals and nursing note reviewed.  Constitutional:      Appearance: Normal appearance.  HENT:     Head: Normocephalic and atraumatic.  Eyes:     Extraocular Movements: Extraocular movements intact.     Conjunctiva/sclera: Conjunctivae normal.     Pupils: Pupils are equal, round, and reactive to light.  Musculoskeletal:     Right foot: Normal range of motion. Bunion and foot drop present.     Left foot: Normal range of motion.  No deformity.     Comments: Mild dextroscoliosis  No leg length discrepancy    Feet:     Right foot:     Skin integrity: Callus and dry skin present. No ulcer, blister or skin breakdown.     Toenail Condition: Right toenails are abnormally thick and long.  Left foot:     Skin integrity: Dry skin present. No ulcer, blister or skin breakdown.     Toenail Condition: Left toenails are abnormally thick and long.  Skin:    General: Skin is warm and dry.  Neurological:     General: No focal deficit present.     Mental Status: She is alert and oriented to person, place, and time.     Comments: Strength is 5/5 bilateral hip flexor knee extensor bilateral plantar flexors 3+ in the right ankle dorsiflexor and great toe extensor 5 in the left ankle dorsiflexors great toe extensor Sensation reduced in the right great toe and right long toe Negative straight leg raising Ambulates without assistive device wide-based support Feet were inspected  Psychiatric:        Mood and Affect: Mood normal.        Behavior: Behavior normal.           Assessment & Plan:  #1.  Painful diabetic neuropathy her diabetes however is well controlled.  We did discuss that neuropathy can persist even if  hemoglobin A1c has improved.  We will continue gabapentin   2.  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  Patient does not wish to wear an AFO.  She also does not wish to see a surgeon for this  Continue tramadol 50 mg 3 times daily, UDS today Continue tizanidine 2 mg twice daily Follow-up in 6 months

## 2019-06-16 LAB — DRUG TOX MONITOR 1 W/CONF, ORAL FLD
Amphetamines: NEGATIVE ng/mL (ref <10)
Barbiturates: NEGATIVE ng/mL (ref <10)
Benzodiazepines: NEGATIVE ng/mL (ref <0.50)
Buprenorphine: NEGATIVE ng/mL (ref <0.10)
Cocaine: NEGATIVE ng/mL (ref <5.0)
Cotinine: 177.5 ng/mL — ABNORMAL HIGH (ref <5.0)
Fentanyl: NEGATIVE ng/mL (ref <0.10)
Heroin Metabolite: NEGATIVE ng/mL (ref <1.0)
MARIJUANA: NEGATIVE ng/mL (ref <2.5)
MDMA: NEGATIVE ng/mL (ref <10)
Meprobamate: NEGATIVE ng/mL (ref <2.5)
Methadone: NEGATIVE ng/mL (ref <5.0)
Nicotine Metabolite: POSITIVE ng/mL — AB (ref <5.0)
Opiates: NEGATIVE ng/mL (ref <2.5)
Phencyclidine: NEGATIVE ng/mL (ref <10)
Tapentadol: NEGATIVE ng/mL (ref <5.0)
Tramadol: >500 ng/mL — ABNORMAL HIGH (ref <5.0)
Tramadol: POSITIVE ng/mL — AB (ref <5.0)
Zolpidem: NEGATIVE ng/mL (ref <5.0)

## 2019-06-16 LAB — DRUG TOX ALC METAB W/CON, ORAL FLD: Alcohol Metabolite: NEGATIVE ng/mL (ref <25)

## 2019-06-24 ENCOUNTER — Other Ambulatory Visit: Payer: Self-pay | Admitting: Family Medicine

## 2019-06-24 DIAGNOSIS — M810 Age-related osteoporosis without current pathological fracture: Secondary | ICD-10-CM

## 2019-06-24 DIAGNOSIS — E2839 Other primary ovarian failure: Secondary | ICD-10-CM

## 2019-07-29 ENCOUNTER — Encounter: Payer: Self-pay | Admitting: Podiatry

## 2019-07-29 ENCOUNTER — Other Ambulatory Visit: Payer: Self-pay

## 2019-07-29 ENCOUNTER — Other Ambulatory Visit: Payer: Medicare Other | Admitting: Orthotics

## 2019-07-29 ENCOUNTER — Ambulatory Visit (INDEPENDENT_AMBULATORY_CARE_PROVIDER_SITE_OTHER): Payer: Medicare Other | Admitting: Podiatry

## 2019-07-29 DIAGNOSIS — M79674 Pain in right toe(s): Secondary | ICD-10-CM | POA: Diagnosis not present

## 2019-07-29 DIAGNOSIS — B351 Tinea unguium: Secondary | ICD-10-CM | POA: Diagnosis not present

## 2019-07-29 DIAGNOSIS — M79675 Pain in left toe(s): Secondary | ICD-10-CM

## 2019-07-29 DIAGNOSIS — E1142 Type 2 diabetes mellitus with diabetic polyneuropathy: Secondary | ICD-10-CM | POA: Diagnosis not present

## 2019-07-29 NOTE — Progress Notes (Signed)
This patient returns to my office for at risk foot care.  This patient requires this care by a professional since this patient will be at risk due to having diabetes with neuropathy.  This patient is unable to cut nails herself since the patient cannot reach her nails.These nails are painful walking and wearing shoes.  This patient presents for at risk foot care today.  General Appearance  Alert, conversant and in no acute stress.  Vascular  Dorsalis pedis and posterior tibial  pulses are palpable  bilaterally.  Capillary return is within normal limits  bilaterally. Temperature is within normal limits  bilaterally.  Neurologic  Senn-Weinstein monofilament wire test within normal limits  bilaterally. Muscle power within normal limits bilaterally.  Nails Thick disfigured discolored nails with subungual debris  from hallux to fifth toes bilaterally. No evidence of bacterial infection or drainage bilaterally.  Orthopedic  No limitations of motion  feet .  No crepitus or effusions noted.  No bony pathology or digital deformities noted.  Skin  normotropic skin with no porokeratosis noted bilaterally.  No signs of infections or ulcers noted.     Onychomycosis  Pain in right toes  Pain in left toes  Consent was obtained for treatment procedures.   Mechanical debridement of nails 1-5  bilaterally performed with a nail nipper.  Filed with dremel without incident. No infection or ulcer.     Return office visit   3 months                  Told patient to return for periodic foot care and evaluation due to potential at risk complications.   Kathrynne Kulinski DPM  

## 2019-09-08 ENCOUNTER — Other Ambulatory Visit: Payer: Self-pay | Admitting: Family Medicine

## 2019-09-08 DIAGNOSIS — Z1231 Encounter for screening mammogram for malignant neoplasm of breast: Secondary | ICD-10-CM

## 2019-10-23 ENCOUNTER — Other Ambulatory Visit: Payer: Self-pay

## 2019-10-23 DIAGNOSIS — I714 Abdominal aortic aneurysm, without rupture, unspecified: Secondary | ICD-10-CM

## 2019-10-29 ENCOUNTER — Ambulatory Visit (INDEPENDENT_AMBULATORY_CARE_PROVIDER_SITE_OTHER): Payer: Medicare Other | Admitting: Podiatry

## 2019-10-29 ENCOUNTER — Other Ambulatory Visit: Payer: Self-pay

## 2019-10-29 ENCOUNTER — Encounter: Payer: Self-pay | Admitting: Podiatry

## 2019-10-29 DIAGNOSIS — B351 Tinea unguium: Secondary | ICD-10-CM

## 2019-10-29 DIAGNOSIS — M79675 Pain in left toe(s): Secondary | ICD-10-CM

## 2019-10-29 DIAGNOSIS — M79674 Pain in right toe(s): Secondary | ICD-10-CM

## 2019-10-29 DIAGNOSIS — E1142 Type 2 diabetes mellitus with diabetic polyneuropathy: Secondary | ICD-10-CM | POA: Diagnosis not present

## 2019-10-29 NOTE — Progress Notes (Signed)
This patient returns to my office for at risk foot care.  This patient requires this care by a professional since this patient will be at risk due to having diabetes with neuropathy.  This patient is unable to cut nails herself since the patient cannot reach her nails.These nails are painful walking and wearing shoes.  This patient presents for at risk foot care today.  General Appearance  Alert, conversant and in no acute stress.  Vascular  Dorsalis pedis and posterior tibial  pulses are palpable  bilaterally.  Capillary return is within normal limits  bilaterally. Temperature is within normal limits  bilaterally.  Neurologic  Senn-Weinstein monofilament wire test within normal limits  bilaterally. Muscle power within normal limits bilaterally.  Nails Thick disfigured discolored nails with subungual debris  from hallux to fifth toes bilaterally. No evidence of bacterial infection or drainage bilaterally.  Orthopedic  No limitations of motion  feet .  No crepitus or effusions noted.  No bony pathology or digital deformities noted.  Skin  normotropic skin with no porokeratosis noted bilaterally.  No signs of infections or ulcers noted.     Onychomycosis  Pain in right toes  Pain in left toes  Consent was obtained for treatment procedures.   Mechanical debridement of nails 1-5  bilaterally performed with a nail nipper.  Filed with dremel without incident. No infection or ulcer.     Return office visit   3 months                  Told patient to return for periodic foot care and evaluation due to potential at risk complications.   Gardiner Barefoot DPM

## 2019-11-03 ENCOUNTER — Telehealth: Payer: Self-pay

## 2019-11-03 NOTE — Telephone Encounter (Signed)
[employer] called on behalf of the patient.  Patient wants to make sure her AAA duplex is approved by her insurance.  I advised her that our office only precerts Medicaid Ultrasounds and MCR should be approved.  She will call us back if she has any other concerns.  Thurston Hole., LPN

## 2019-11-07 ENCOUNTER — Ambulatory Visit (HOSPITAL_COMMUNITY)
Admission: RE | Admit: 2019-11-07 | Discharge: 2019-11-07 | Disposition: A | Discharge status: Home / Self Care | Payer: Medicare Other | Source: Ambulatory Visit | Attending: Vascular Surgery | Admitting: Vascular Surgery

## 2019-11-07 ENCOUNTER — Ambulatory Visit (INDEPENDENT_AMBULATORY_CARE_PROVIDER_SITE_OTHER): Payer: Medicare Other | Admitting: Physician Assistant

## 2019-11-07 ENCOUNTER — Other Ambulatory Visit: Payer: Self-pay

## 2019-11-07 VITALS — BP 120/81 | HR 68 | Temp 98.0°F | Resp 20 | Ht <= 58 in | Wt 109.6 lb

## 2019-11-07 DIAGNOSIS — F17200 Nicotine dependence, unspecified, uncomplicated: Secondary | ICD-10-CM

## 2019-11-07 DIAGNOSIS — I714 Abdominal aortic aneurysm, without rupture, unspecified: Secondary | ICD-10-CM

## 2019-11-07 DIAGNOSIS — I10 Essential (primary) hypertension: Secondary | ICD-10-CM | POA: Diagnosis not present

## 2019-11-07 NOTE — Progress Notes (Signed)
Office Note     CC:  follow up Requesting Provider:  Carol Ada, MD  HPI: Kylie Hurst is a 74 y.o. (October 03, 1945) female here for follow-up.  Her last visit here was with Dr. Donnetta Hutching on Jun 27, 2016.  The maximal diameter of her aneurysm was 3.8 cm and had shown no growth over 4 years at that time.  2-year surveillance was recommended.  Was seen by Dr. Kellie Simmering in 2014 for evaluation of his small infrarenal abdominal aortic aneurysm found incidentally.  She did have a study in 2016 which was not our office. This revealed no change with a maximal diameter 3.7 cm.   She has no symptoms referable to her aneurysm.  She denies changes in her chronic back pain.  She denies claudication.  Has multiple medical issues as described below. No history of peripheral vascular occlusive disease, CAD or stroke. States she has bilateral lower extremity neuropathy.  She has chronic back pain controlled with Tramadol and is under pain management.  Compliant with aspirin and pravastatin. Previously diagnosed with DM but A1C is less than 7% so not on meds. On ACEI and HCTZ for hypertension. One-half pack per day cigarette use. Has not had success with Chantix in the past.   Past Medical History:  Diagnosis Date  . Aortic aneurysm ([place]) 2011   3.61mm  . Arthritis   . Back spasm   . Bulging discs   . Chronic back pain   . Depression   . Diabetes mellitus without complication (HCC)    fasting 90-110  . Emphysema   . Environmental and seasonal allergies   . Hyperlipidemia   . Hypertension   . Neuromuscular disorder ([place])   . Onycholysis of toenail   . Osteoporosis   . Ovarian cyst 06/01/2009   5 cm  . Restless legs   . Spigelian hernia    left ventral  . Wears glasses     Past Surgical History:  Procedure Laterality Date  . ANTERIOR CERVICAL DECOMP/DISCECTOMY FUSION N/A 12/18/2012   Procedure: ANTERIOR CERVICAL DECOMPRESSION/DISCECTOMY FUSION 1 LEVEL;  Surgeon: Sinclair Ship, MD;   Location: [facility];  Service: Orthopedics;  Laterality: N/A;  Anterior cervical decompression fusion, cervical 7 - thoracic 1 with instrumentation, allograft  . COLONOSCOPY    . INSERTION OF MESH Left 05/14/2014   Procedure: INSERTION OF MESH;  Surgeon: Donnie Mesa, MD;  Location: [facility];  Service: General;  Laterality: Left;  . NECK SURGERY    . SPIGELIAN HERNIA  05/14/2014  . VENTRAL HERNIA REPAIR Left 05/14/2014   Procedure: ATTEMPTED LAPAROSCOPIC REPAIR LEFT SPIGLIAN HERINA CONVERTED TO OPEN REPAIR;  Surgeon: Donnie Mesa, MD;  Location: [facility];  Service: General;  Laterality: Left;    Social History   Socioeconomic History  . Marital status: Divorced    Spouse name: Not on file  . Number of children: Not on file  . Years of education: Not on file  . Highest education level: Not on file  Occupational History  . Not on file  Tobacco Use  . Smoking status: Current Every Day Smoker    Packs/day: 1.00    Years: 52.00    Pack years: 52.00    Types: Cigarettes  . Smokeless tobacco: Never Used  . Tobacco comment: Currently on Chantix  Vaping Use  . Vaping Use: Never used  Substance and Sexual Activity  . Alcohol use: No  . Drug use: No  . Sexual activity: Not on file  Other Topics  Concern  . Not on file  Social History Narrative  . Not on file   Social Determinants of Health   Financial Resource Strain:   . Difficulty of Paying Living Expenses: Not on file  Food Insecurity:   . Worried About Charity fundraiser in the Last Year: Not on file  . Ran Out of Food in the Last Year: Not on file  Transportation Needs:   . Lack of Transportation (Medical): Not on file  . Lack of Transportation (Non-Medical): Not on file  Physical Activity:   . Days of Exercise per Week: Not on file  . Minutes of Exercise per Session: Not on file  Stress:   . Feeling of Stress : Not on file  Social Connections:   . Frequency of Communication with Friends and Family: Not on file  . Frequency of  Social Gatherings with Friends and Family: Not on file  . Attends Religious Services: Not on file  . Active Member of Clubs or Organizations: Not on file  . Attends Archivist Meetings: Not on file  . Marital Status: Not on file  Intimate Partner Violence:   . Fear of Current or Ex-Partner: Not on file  . Emotionally Abused: Not on file  . Physically Abused: Not on file  . Sexually Abused: Not on file   Family History  Problem Relation Age of Onset  . Diabetes Mother   . Cancer Sister   . Epilepsy Brother     Current Outpatient Medications  Medication Sig Dispense Refill  . ACCU-CHEK AVIVA PLUS test strip USE AS DIRECTED TO TEST BLOOD SUGAR DAILY    . ANORO ELLIPTA 62.5-25 MCG/INH AEPB     . Ascorbic Acid (VITAMIN C) 1000 MG tablet Take by mouth 2 (two) times daily. Patient takes one tablet (1000 mg) daily during Spring, Summer, and Fall. Patient takes one tablet (1000 mg) twice daily in the Winter.    Marland Kitchen aspirin (ASPIRIN CHILDRENS) 81 MG chewable tablet Chew 1 tablet (81 mg total) by mouth daily. 30 tablet 0  . b complex vitamins tablet Take 1 tablet by mouth daily.    Marland Kitchen BIOTIN PO Take 500 mg by mouth daily.     . Calcium Carbonate-Vitamin D 600-400 MG-UNIT per tablet Take 1 tablet by mouth daily.     . cetirizine (ZYRTEC) 10 MG tablet Take 10 mg by mouth at bedtime.    Marland Kitchen denosumab (PROLIA) 60 MG/ML SOSY injection Inject 60 mg into the skin every 6 (six) months.    . fluticasone (FLONASE) 50 MCG/ACT nasal spray Place 2 sprays into both nostrils daily.    . hydrochlorothiazide (MICROZIDE) 12.5 MG capsule Take 12.5 mg by mouth daily.    Marland Kitchen KLOR-CON M20 20 MEQ tablet Take 20 mEq daily by mouth.    Marland Kitchen lisinopril (PRINIVIL,ZESTRIL) 5 MG tablet Take 5 mg by mouth at bedtime.     . Multiple Vitamins-Minerals (MULTIVITAMIN WITH MINERALS) tablet Take 1 tablet by mouth every evening.     . Omega-3 Fatty Acids (FISH OIL) 1000 MG CAPS Take by mouth. Fish oil 1000mg /300mg  Omega 3    .  Potassium Chloride ER 20 MEQ TBCR TAKE 1 TABLET BY MOUTH ONCE DAILY WITH FOOD FOR 90 DAYS  1  . pravastatin (PRAVACHOL) 20 MG tablet Take 20 mg by mouth at bedtime.     Marland Kitchen SHINGRIX injection     . tiZANidine (ZANAFLEX) 2 MG tablet Take 1 tablet (2 mg total) by mouth 2 (  two) times daily. 180 tablet 1  . traMADol (ULTRAM) 50 MG tablet Take 1 tablet (50 mg total) by mouth every 8 (eight) hours as needed. no more than 3 per day 90 tablet 5   No current facility-administered medications for this visit.    No Known Allergies   REVIEW OF SYSTEMS:   [X]  denotes positive finding, [ ]  denotes negative finding Cardiac  Comments:  Chest pain or chest pressure:    Shortness of breath upon exertion:    Short of breath when lying flat:    Irregular heart rhythm:        Vascular    Pain in calf, thigh, or hip brought on by ambulation:    Pain in feet at night that wakes you up from your sleep:     Blood clot in your veins:    Leg swelling:         Pulmonary    Oxygen at home:    Productive cough:     Wheezing:         Neurologic    Sudden weakness in arms or legs:     Sudden numbness in arms or legs:     Sudden onset of difficulty speaking or slurred speech:    Temporary loss of vision in one eye:     Problems with dizziness:         Gastrointestinal    Blood in stool:     Vomited blood:         Genitourinary    Burning when urinating:     Blood in urine:        Psychiatric    Major depression:         Hematologic    Bleeding problems:    Problems with blood clotting too easily:        Skin    Rashes or ulcers:        Constitutional    Fever or chills:      PHYSICAL EXAMINATION: Vitals:   11/07/19 0949  BP: 120/81  Pulse: 68  Resp: 20  Temp: 98 F (36.7 C)  SpO2: 99%   General:  WDWN in NAD; vital signs documented above Gait: Unaided.  No ataxia HENT: WNL, normocephalic Pulmonary: normal non-labored breathing , without Rales, rhonchi,  wheezing Cardiac:  regular HR, without  Murmurs without carotid bruit Abdomen: soft, NT, no masses Skin: without rashes Vascular Exam/Pulses: The patient has 2+ brachial, radial, femoral, popliteal and dorsalis pedis pulses bilaterally.  1+ posterior tibial pulses bilaterally Extremities: without ischemic changes, without Gangrene , without cellulitis; without open wounds;  Musculoskeletal: no muscle wasting or atrophy  Neurologic: A&O X 3;  No focal weakness or paresthesias are detected Psychiatric:  The pt has Normal affect.   Non-Invasive Vascular Imaging:   11/07/2019 Maximal aorta diameter: 4.4 cm No evidence of iliac artery aneurysm.   ASSESSMENT/PLAN:: 74 y.o. female here for follow up for infrarenal abdominal aortic aneurysm.  Her aneurysm has increased 6 mm in size over the last 3 years.  Due to her interval of follow-up and the interval increase in size of the maximal aortic diameter, recommend follow-up in 6 months with ultrasound of the aorta to assist in assessment of aneurysmal growth.    Continue efforts at smoking cessation.  Continue medications as prescribed.  Advised to seek immediate medical attention for sudden onset of severe abdominal or back pain.  Barbie Banner, PA-C [facility] [phone]  Clinic MD:  Donzetta Matters

## 2019-11-11 ENCOUNTER — Other Ambulatory Visit: Payer: Self-pay | Admitting: *Deleted

## 2019-11-11 DIAGNOSIS — I714 Abdominal aortic aneurysm, without rupture, unspecified: Secondary | ICD-10-CM

## 2019-12-12 ENCOUNTER — Encounter: Payer: Self-pay | Admitting: Registered Nurse

## 2019-12-12 ENCOUNTER — Other Ambulatory Visit: Payer: Self-pay

## 2019-12-12 ENCOUNTER — Encounter: Payer: Medicare Other | Attending: Registered Nurse | Admitting: Registered Nurse

## 2019-12-12 VITALS — BP 125/81 | HR 98 | Temp 98.4°F | Ht <= 58 in | Wt 111.8 lb

## 2019-12-12 DIAGNOSIS — M4317 Spondylolisthesis, lumbosacral region: Secondary | ICD-10-CM | POA: Insufficient documentation

## 2019-12-12 DIAGNOSIS — E1142 Type 2 diabetes mellitus with diabetic polyneuropathy: Secondary | ICD-10-CM | POA: Diagnosis not present

## 2019-12-12 DIAGNOSIS — Z5181 Encounter for therapeutic drug level monitoring: Secondary | ICD-10-CM | POA: Diagnosis present

## 2019-12-12 DIAGNOSIS — G894 Chronic pain syndrome: Secondary | ICD-10-CM | POA: Insufficient documentation

## 2019-12-12 DIAGNOSIS — Z79899 Other long term (current) drug therapy: Secondary | ICD-10-CM | POA: Insufficient documentation

## 2019-12-12 MED ORDER — TRAMADOL HCL 50 MG PO TABS: 50.0000 mg | ORAL_TABLET | Freq: Three times a day (TID) | ORAL | 5 refills | Status: DC | PRN

## 2019-12-12 MED ORDER — TIZANIDINE HCL 2 MG PO TABS: 2.0000 mg | ORAL_TABLET | Freq: Two times a day (BID) | ORAL | 1 refills | Status: DC

## 2019-12-12 NOTE — Progress Notes (Signed)
Subjective:    Patient ID: Kylie Hurst, female    DOB: April 29, 1945, 74 y.o.   MRN: [id number]  HPI: Kylie Hurst is a 74 y.o. female who returns for follow up appointment for chronic pain and medication refill. She states her pain is located in her lower back and bilateral feet with numbness. She rates her pain 8. Her current exercise regime is walking and gardening.   [name] Morphine equivalent is 15.00 MME.    Oral Swab was Performed on 06/12/2019, it was consistent.  Pain Inventory Average Pain 8 Pain Right Now 8 My pain is burning and tingling  In the last 24 hours, has pain interfered with the following? General activity 4 Relation with others 0 Enjoyment of life 2 What TIME of day is your pain at its worst? night Sleep (in general) Fair  Pain is worse with: walking, bending, standing and some activites Pain improves with: rest and medication Relief from Meds: 8  Family History  Problem Relation Age of Onset  . Diabetes Mother   . Cancer Sister   . Epilepsy Brother    Social History   Socioeconomic History  . Marital status: Divorced    Spouse name: Not on file  . Number of children: Not on file  . Years of education: Not on file  . Highest education level: Not on file  Occupational History  . Not on file  Tobacco Use  . Smoking status: Current Every Day Smoker    Packs/day: 1.00    Years: 52.00    Pack years: 52.00    Types: Cigarettes  . Smokeless tobacco: Never Used  . Tobacco comment: Currently on Chantix  Vaping Use  . Vaping Use: Never used  Substance and Sexual Activity  . Alcohol use: No  . Drug use: No  . Sexual activity: Not on file  Other Topics Concern  . Not on file  Social History Narrative  . Not on file   Social Determinants of Health   Financial Resource Strain:   . Difficulty of Paying Living Expenses: Not on file  Food Insecurity:   . Worried About Charity fundraiser in the Last Year: Not on file  . Ran Out of  Food in the Last Year: Not on file  Transportation Needs:   . Lack of Transportation (Medical): Not on file  . Lack of Transportation (Non-Medical): Not on file  Physical Activity:   . Days of Exercise per Week: Not on file  . Minutes of Exercise per Session: Not on file  Stress:   . Feeling of Stress : Not on file  Social Connections:   . Frequency of Communication with Friends and Family: Not on file  . Frequency of Social Gatherings with Friends and Family: Not on file  . Attends Religious Services: Not on file  . Active Member of Clubs or Organizations: Not on file  . Attends Archivist Meetings: Not on file  . Marital Status: Not on file   Past Surgical History:  Procedure Laterality Date  . ANTERIOR CERVICAL DECOMP/DISCECTOMY FUSION N/A 12/18/2012   Procedure: ANTERIOR CERVICAL DECOMPRESSION/DISCECTOMY FUSION 1 LEVEL;  Surgeon: Sinclair Ship, MD;  Location: [place];  Service: Orthopedics;  Laterality: N/A;  Anterior cervical decompression fusion, cervical 7 - thoracic 1 with instrumentation, allograft  . COLONOSCOPY    . INSERTION OF MESH Left 05/14/2014   Procedure: INSERTION OF MESH;  Surgeon: Donnie Mesa, MD;  Location: [place];  Service: General;  Laterality: Left;  . NECK SURGERY    . SPIGELIAN HERNIA  05/14/2014  . VENTRAL HERNIA REPAIR Left 05/14/2014   Procedure: ATTEMPTED LAPAROSCOPIC REPAIR LEFT SPIGLIAN HERINA CONVERTED TO OPEN REPAIR;  Surgeon: Donnie Mesa, MD;  Location: [place];  Service: General;  Laterality: Left;   Past Surgical History:  Procedure Laterality Date  . ANTERIOR CERVICAL DECOMP/DISCECTOMY FUSION N/A 12/18/2012   Procedure: ANTERIOR CERVICAL DECOMPRESSION/DISCECTOMY FUSION 1 LEVEL;  Surgeon: Sinclair Ship, MD;  Location: [place];  Service: Orthopedics;  Laterality: N/A;  Anterior cervical decompression fusion, cervical 7 - thoracic 1 with instrumentation, allograft  . COLONOSCOPY    . INSERTION OF MESH Left 05/14/2014    Procedure: INSERTION OF MESH;  Surgeon: Donnie Mesa, MD;  Location: [place] [facility];  Service: General;  Laterality: Left;  . NECK SURGERY    . SPIGELIAN HERNIA  05/14/2014  . VENTRAL HERNIA REPAIR Left 05/14/2014   Procedure: ATTEMPTED LAPAROSCOPIC REPAIR LEFT SPIGLIAN HERINA CONVERTED TO OPEN REPAIR;  Surgeon: Donnie Mesa, MD;  Location: [place];  Service: General;  Laterality: Left;   Past Medical History:  Diagnosis Date  . Aortic aneurysm ([place]) 2011   3.87mm  . Arthritis   . Back spasm   . Bulging discs   . Chronic back pain   . Depression   . Diabetes mellitus without complication (HCC)    fasting 90-110  . Emphysema   . Environmental and seasonal allergies   . Hyperlipidemia   . Hypertension   . Neuromuscular disorder ([place])   . Onycholysis of toenail   . Osteoporosis   . Ovarian cyst 06/01/2009   5 cm  . Restless legs   . Spigelian hernia    left ventral  . Wears glasses    BP 125/81   Pulse 98   Temp 98.4 F (36.9 C)   Ht 4\' 9"  (1.448 m)   Wt 111 lb 12.8 oz (50.7 kg)   SpO2 94%   BMI 24.19 kg/m   Opioid Risk Score:   Fall Risk Score:  `1  Depression screen PHQ 2/9  Depression screen [facility] 2/9 06/12/2019 07/02/2018 07/03/2017 07/05/2015  Decreased Interest 0 0 0 0  Down, Depressed, Hopeless 0 0 0 0  PHQ - 2 Score 0 0 0 0  Altered sleeping - - - 0  Tired, decreased energy - - - 0  Change in appetite - - - 0  Feeling bad or failure about yourself  - - - 0  Trouble concentrating - - - 0  Moving slowly or fidgety/restless - - - 0  Suicidal thoughts - - - 0  PHQ-9 Score - - - 0  Difficult doing work/chores - - - Not difficult at all     Review of Systems  Musculoskeletal: Positive for back pain.       Shoulder pain  Leg pain       Objective:   Physical Exam Vitals and nursing note reviewed.  Constitutional:      Appearance: Normal appearance.  Cardiovascular:     Rate and Rhythm: Normal rate and regular rhythm.     Pulses: Normal pulses.     Heart sounds:  Normal heart sounds.  Pulmonary:     Effort: Pulmonary effort is normal.     Breath sounds: Normal breath sounds.  Musculoskeletal:     Cervical back: Normal range of motion and neck supple.     Comments: Normal Muscle Bulk and Muscle Testing Reveals:  Upper  Extremities: Full ROM and Muscle Strength 5/5  Lower Extremities: Full ROM and Muscle Strength 5/5 Arises from Table with ease Narrow Based Gait   Skin:    General: Skin is warm and dry.  Neurological:     Mental Status: She is alert and oriented to person, place, and time.  Psychiatric:        Mood and Affect: Mood normal.        Behavior: Behavior normal.           Assessment & Plan:  1. 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 Radiculopathies at L5-S1.12/12/2019 Refilled: Tramadol 50 mg one tablet every 8hours as needed,no more than three a day. #90.  We will continue the opioid monitoring program, this consists of regular clinic visits, examinations, urine drug screen, pill counts as well as use of [place] Controlled Substance Reporting system. A 12 month History has been reviewed on the [place] Controlled Substance Reporting System on 12/12/2019 2. Diabetic peripheral neuropathy: RefusesGabapentin, continue to monitor, Continue Tight Glucose Control. Continue to Monitor.12/12/2019. 3. Chronic Bilateral Shoulder Pain:No complaints Today.Continue current medication regimen. Alternate Heat and Ice Therapy.12/12/2019.    20 minutes of face to face patient care time was spent during this visit. All questions were encouraged and answered.   F/U in 6 months

## 2019-12-19 ENCOUNTER — Ambulatory Visit
Admission: RE | Admit: 2019-12-19 | Discharge: 2019-12-19 | Disposition: A | Discharge status: Home / Self Care | Payer: Medicare Other | Source: Ambulatory Visit | Attending: Family Medicine | Admitting: Family Medicine

## 2019-12-19 ENCOUNTER — Other Ambulatory Visit: Payer: Self-pay

## 2019-12-19 DIAGNOSIS — E2839 Other primary ovarian failure: Secondary | ICD-10-CM

## 2019-12-19 DIAGNOSIS — M810 Age-related osteoporosis without current pathological fracture: Secondary | ICD-10-CM

## 2019-12-19 DIAGNOSIS — Z1231 Encounter for screening mammogram for malignant neoplasm of breast: Secondary | ICD-10-CM

## 2020-01-22 ENCOUNTER — Other Ambulatory Visit: Payer: Self-pay | Admitting: Physical Medicine & Rehabilitation

## 2020-02-04 ENCOUNTER — Encounter: Payer: Self-pay | Admitting: Podiatry

## 2020-02-04 ENCOUNTER — Other Ambulatory Visit: Payer: Self-pay

## 2020-02-04 ENCOUNTER — Ambulatory Visit (INDEPENDENT_AMBULATORY_CARE_PROVIDER_SITE_OTHER): Payer: Medicare Other | Admitting: Podiatry

## 2020-02-04 DIAGNOSIS — B351 Tinea unguium: Secondary | ICD-10-CM | POA: Diagnosis not present

## 2020-02-04 DIAGNOSIS — M79675 Pain in left toe(s): Secondary | ICD-10-CM

## 2020-02-04 DIAGNOSIS — L84 Corns and callosities: Secondary | ICD-10-CM

## 2020-02-04 DIAGNOSIS — E1142 Type 2 diabetes mellitus with diabetic polyneuropathy: Secondary | ICD-10-CM | POA: Diagnosis not present

## 2020-02-04 DIAGNOSIS — M79674 Pain in right toe(s): Secondary | ICD-10-CM | POA: Diagnosis not present

## 2020-02-04 DIAGNOSIS — E119 Type 2 diabetes mellitus without complications: Secondary | ICD-10-CM

## 2020-02-04 NOTE — Progress Notes (Signed)
This patient returns to my office for at risk foot care.  This patient requires this care by a professional since this patient will be at risk due to having diabetes with neuropathy.  This patient is unable to cut nails herself since the patient cannot reach her nails.These nails are painful walking and wearing shoes.  This patient presents for at risk foot care today.  General Appearance  Alert, conversant and in no acute stress.  Vascular  Dorsalis pedis and posterior tibial  pulses are palpable  bilaterally.  Capillary return is within normal limits  bilaterally. Temperature is within normal limits  bilaterally.  Neurologic  Senn-Weinstein monofilament wire test within normal limits  bilaterally. Muscle power within normal limits bilaterally.  Nails Thick disfigured discolored nails with subungual debris  from hallux to fifth toes bilaterally. No evidence of bacterial infection or drainage bilaterally.  Orthopedic  No limitations of motion  feet .  No crepitus or effusions noted.  No bony pathology or digital deformities noted.  Skin  normotropic skin with no porokeratosis noted bilaterally.  No signs of infections or ulcers noted.   HD 5th right.  Onychomycosis  Pain in right toes  Pain in left toes  HD 5th right  Consent was obtained for treatment procedures.   Mechanical debridement of nails 1-5  bilaterally performed with a nail nipper.  Filed with dremel without incident. No infection or ulcer.  Debride HD 5th right.   Return office visit   3 months                  Told patient to return for periodic foot care and evaluation due to potential at risk complications.   Helane Gunther DPM

## 2020-02-06 ENCOUNTER — Ambulatory Visit: Payer: Medicare Other

## 2020-02-10 DIAGNOSIS — Z1159 Encounter for screening for other viral diseases: Secondary | ICD-10-CM | POA: Diagnosis not present

## 2020-02-20 ENCOUNTER — Ambulatory Visit: Payer: Medicare Other

## 2020-03-04 ENCOUNTER — Inpatient Hospital Stay: Admission: RE | Admit: 2020-03-04 | Payer: Medicare Other | Source: Ambulatory Visit

## 2020-04-26 ENCOUNTER — Other Ambulatory Visit: Payer: Self-pay | Admitting: *Deleted

## 2020-04-26 DIAGNOSIS — F17210 Nicotine dependence, cigarettes, uncomplicated: Secondary | ICD-10-CM

## 2020-04-26 DIAGNOSIS — Z87891 Personal history of nicotine dependence: Secondary | ICD-10-CM

## 2020-05-05 ENCOUNTER — Encounter: Payer: Self-pay | Admitting: Podiatry

## 2020-05-05 ENCOUNTER — Ambulatory Visit (INDEPENDENT_AMBULATORY_CARE_PROVIDER_SITE_OTHER): Payer: Medicare Other | Admitting: Podiatry

## 2020-05-05 ENCOUNTER — Other Ambulatory Visit: Payer: Self-pay

## 2020-05-05 DIAGNOSIS — B351 Tinea unguium: Secondary | ICD-10-CM | POA: Diagnosis not present

## 2020-05-05 DIAGNOSIS — M79674 Pain in right toe(s): Secondary | ICD-10-CM

## 2020-05-05 DIAGNOSIS — E1142 Type 2 diabetes mellitus with diabetic polyneuropathy: Secondary | ICD-10-CM

## 2020-05-05 DIAGNOSIS — M79675 Pain in left toe(s): Secondary | ICD-10-CM

## 2020-05-05 NOTE — Progress Notes (Signed)
This patient returns to my office for at risk foot care.  This patient requires this care by a professional since this patient will be at risk due to having diabetes with neuropathy.  This patient is unable to cut nails herself since the patient cannot reach her nails.These nails are painful walking and wearing shoes.  This patient presents for at risk foot care today.  General Appearance  Alert, conversant and in no acute stress.  Vascular  Dorsalis pedis and posterior tibial  pulses are palpable  bilaterally.  Capillary return is within normal limits  bilaterally. Temperature is within normal limits  bilaterally.  Neurologic  Senn-Weinstein monofilament wire test within normal limits  bilaterally. Muscle power within normal limits bilaterally.  Nails Thick disfigured discolored nails with subungual debris  from hallux to fifth toes bilaterally. No evidence of bacterial infection or drainage bilaterally.  Orthopedic  No limitations of motion  feet .  No crepitus or effusions noted.  No bony pathology or digital deformities noted.  Skin  normotropic skin with no porokeratosis noted bilaterally.  No signs of infections or ulcers noted.   HD 5th right resolved.  Onychomycosis  Pain in right toes  Pain in left toes    Consent was obtained for treatment procedures.   Mechanical debridement of nails 1-5  bilaterally performed with a nail nipper.  Filed with dremel without incident. No infection or ulcer.    Return office visit   3 months                  Told patient to return for periodic foot care and evaluation due to potential at risk complications.   Gardiner Barefoot DPM

## 2020-05-13 DIAGNOSIS — M810 Age-related osteoporosis without current pathological fracture: Secondary | ICD-10-CM | POA: Diagnosis not present

## 2020-05-14 ENCOUNTER — Ambulatory Visit: Payer: Medicare Other

## 2020-05-14 ENCOUNTER — Inpatient Hospital Stay: Admission: RE | Admit: 2020-05-14 | Payer: Medicare Other | Source: Ambulatory Visit

## 2020-05-26 ENCOUNTER — Ambulatory Visit (INDEPENDENT_AMBULATORY_CARE_PROVIDER_SITE_OTHER): Payer: Medicare Other | Admitting: Physician Assistant

## 2020-05-26 ENCOUNTER — Encounter: Payer: Self-pay | Admitting: Physician Assistant

## 2020-05-26 ENCOUNTER — Other Ambulatory Visit: Payer: Self-pay

## 2020-05-26 ENCOUNTER — Ambulatory Visit (HOSPITAL_COMMUNITY)
Admission: RE | Admit: 2020-05-26 | Discharge: 2020-05-26 | Disposition: A | Discharge status: Home / Self Care | Payer: Medicare Other | Source: Ambulatory Visit | Attending: Vascular Surgery | Admitting: Vascular Surgery

## 2020-05-26 VITALS — BP 115/74 | HR 74 | Temp 98.3°F | Resp 20 | Ht <= 58 in | Wt 107.9 lb

## 2020-05-26 DIAGNOSIS — K5900 Constipation, unspecified: Secondary | ICD-10-CM | POA: Insufficient documentation

## 2020-05-26 DIAGNOSIS — E785 Hyperlipidemia, unspecified: Secondary | ICD-10-CM | POA: Insufficient documentation

## 2020-05-26 DIAGNOSIS — N939 Abnormal uterine and vaginal bleeding, unspecified: Secondary | ICD-10-CM

## 2020-05-26 DIAGNOSIS — I700 Atherosclerosis of aorta: Secondary | ICD-10-CM

## 2020-05-26 DIAGNOSIS — N816 Rectocele: Secondary | ICD-10-CM

## 2020-05-26 DIAGNOSIS — I7090 Unspecified atherosclerosis: Secondary | ICD-10-CM | POA: Insufficient documentation

## 2020-05-26 DIAGNOSIS — I714 Abdominal aortic aneurysm, without rupture, unspecified: Secondary | ICD-10-CM

## 2020-05-26 DIAGNOSIS — E2839 Other primary ovarian failure: Secondary | ICD-10-CM

## 2020-05-26 DIAGNOSIS — E876 Hypokalemia: Secondary | ICD-10-CM | POA: Insufficient documentation

## 2020-05-26 DIAGNOSIS — J301 Allergic rhinitis due to pollen: Secondary | ICD-10-CM

## 2020-05-26 DIAGNOSIS — F17200 Nicotine dependence, unspecified, uncomplicated: Secondary | ICD-10-CM

## 2020-05-26 DIAGNOSIS — D126 Benign neoplasm of colon, unspecified: Secondary | ICD-10-CM | POA: Insufficient documentation

## 2020-05-26 DIAGNOSIS — Z860100 Personal history of colon polyps, unspecified: Secondary | ICD-10-CM

## 2020-05-26 DIAGNOSIS — J449 Chronic obstructive pulmonary disease, unspecified: Secondary | ICD-10-CM | POA: Insufficient documentation

## 2020-05-26 DIAGNOSIS — I10 Essential (primary) hypertension: Secondary | ICD-10-CM | POA: Insufficient documentation

## 2020-05-26 DIAGNOSIS — M810 Age-related osteoporosis without current pathological fracture: Secondary | ICD-10-CM | POA: Insufficient documentation

## 2020-05-26 DIAGNOSIS — M2042 Other hammer toe(s) (acquired), left foot: Secondary | ICD-10-CM

## 2020-05-26 DIAGNOSIS — R209 Unspecified disturbances of skin sensation: Secondary | ICD-10-CM

## 2020-05-26 HISTORY — DX: Hypercalcemia: E83.52

## 2020-05-26 HISTORY — DX: Benign neoplasm of colon, unspecified: D12.6

## 2020-05-26 HISTORY — DX: Constipation, unspecified: K59.00

## 2020-05-26 HISTORY — DX: Allergic rhinitis due to pollen: J30.1

## 2020-05-26 HISTORY — DX: Personal history of colon polyps, unspecified: Z86.0100

## 2020-05-26 HISTORY — DX: Unspecified atherosclerosis: I70.90

## 2020-05-26 HISTORY — DX: Other hammer toe(s) (acquired), left foot: M20.42

## 2020-05-26 HISTORY — DX: Nicotine dependence, unspecified, uncomplicated: F17.200

## 2020-05-26 HISTORY — DX: Atherosclerosis of aorta: I70.0

## 2020-05-26 HISTORY — DX: Unspecified disturbances of skin sensation: R20.9

## 2020-05-26 HISTORY — DX: Hypokalemia: E87.6

## 2020-05-26 HISTORY — DX: Abnormal uterine and vaginal bleeding, unspecified: N93.9

## 2020-05-26 HISTORY — DX: Other primary ovarian failure: E28.39

## 2020-05-26 HISTORY — DX: Rectocele: N81.6

## 2020-05-26 HISTORY — DX: Chronic obstructive pulmonary disease, unspecified: J44.9

## 2020-05-26 NOTE — Progress Notes (Signed)
[facility] HISTORY AND PHYSICAL   History of Present Illness:  Patient is a 75 y.o. year old female who presents for evaluation of abdominal aortic aneurysm.  The aneurysm is currently 4.4cm in diameter by US/CT performed at [facility] on 11/07/19.  The patient denies abdominal pain.  The patient denies back pain.    Compliant with aspirin and pravastatin.  She does continue to use tobacco daily.  Past Medical History:  Diagnosis Date  . Aortic aneurysm ([place]) 2011   3.58mm  . Arthritis   . Back spasm   . Bulging discs   . Chronic back pain   . Depression   . Diabetes mellitus without complication (HCC)    fasting 90-110  . Emphysema   . Environmental and seasonal allergies   . Hyperlipidemia   . Hypertension   . Neuromuscular disorder ([place])   . Onycholysis of toenail   . Osteoporosis   . Ovarian cyst 06/01/2009   5 cm  . Restless legs   . Spigelian hernia    left ventral  . Wears glasses     Past Surgical History:  Procedure Laterality Date  . ANTERIOR CERVICAL DECOMP/DISCECTOMY FUSION N/A 12/18/2012   Procedure: ANTERIOR CERVICAL DECOMPRESSION/DISCECTOMY FUSION 1 LEVEL;  Surgeon: Sinclair Ship, MD;  Location: [place];  Service: Orthopedics;  Laterality: N/A;  Anterior cervical decompression fusion, cervical 7 - thoracic 1 with instrumentation, allograft  . COLONOSCOPY    . INSERTION OF MESH Left 05/14/2014   Procedure: INSERTION OF MESH;  Surgeon: Donnie Mesa, MD;  Location: [place];  Service: General;  Laterality: Left;  . NECK SURGERY    . SPIGELIAN HERNIA  05/14/2014  . VENTRAL HERNIA REPAIR Left 05/14/2014   Procedure: ATTEMPTED LAPAROSCOPIC REPAIR LEFT SPIGLIAN HERINA CONVERTED TO OPEN REPAIR;  Surgeon: Donnie Mesa, MD;  Location: [facility];  Service: General;  Laterality: Left;     Social History Social History   Tobacco Use  . Smoking status: Current Every Day Smoker    Packs/day: 1.00    Years: 52.00    Pack years: 52.00    Types:  Cigarettes  . Smokeless tobacco: Never Used  . Tobacco comment: Currently on Chantix  Vaping Use  . Vaping Use: Never used  Substance Use Topics  . Alcohol use: No  . Drug use: No    Family History Family History  Problem Relation Age of Onset  . Diabetes Mother   . Cancer Sister   . Epilepsy Brother     Allergies  Allergies  Allergen Reactions  . Other     Other reaction(s): Unknown     Current Outpatient Medications  Medication Sig Dispense Refill  . ACCU-CHEK AVIVA PLUS test strip USE AS DIRECTED TO TEST BLOOD SUGAR DAILY    . albuterol (VENTOLIN HFA) 108 (90 Base) MCG/ACT inhaler SMARTSIG:1 Puff(s) By Mouth Every 4 Hours PRN    . ANORO ELLIPTA 62.5-25 MCG/INH AEPB     . Ascorbic Acid (VITAMIN C) 1000 MG tablet Take by mouth 2 (two) times daily. Patient takes one tablet (1000 mg) daily during Spring, Summer, and Fall. Patient takes one tablet (1000 mg) twice daily in the Winter.    Marland Kitchen aspirin (ASPIRIN CHILDRENS) 81 MG chewable tablet Chew 1 tablet (81 mg total) by mouth daily. 30 tablet 0  . b complex vitamins tablet Take 1 tablet by mouth daily.    Marland Kitchen BIOTIN PO Take 500 mg by mouth daily.     Marland Kitchen  Calcium Carbonate-Vitamin D 600-400 MG-UNIT per tablet Take 1 tablet by mouth daily.     . cetirizine (ZYRTEC) 10 MG tablet Take 10 mg by mouth at bedtime.    Marland Kitchen denosumab (PROLIA) 60 MG/ML SOSY injection Inject 60 mg into the skin every 6 (six) months.    . fexofenadine (ALLEGRA) 180 MG tablet 1 tablet as needed    . fluticasone (FLONASE) 50 MCG/ACT nasal spray Place 2 sprays into both nostrils daily.    . hydrochlorothiazide (MICROZIDE) 12.5 MG capsule Take 12.5 mg by mouth daily.    Marland Kitchen KLOR-CON M20 20 MEQ tablet Take 20 mEq daily by mouth.    Marland Kitchen lisinopril (PRINIVIL,ZESTRIL) 5 MG tablet Take 5 mg by mouth at bedtime.     . Multiple Vitamins-Minerals (MULTIVITAMIN WITH MINERALS) tablet Take 1 tablet by mouth every evening.     . Omega-3 Fatty Acids (FISH OIL) 1000 MG CAPS Take by  mouth. Fish oil 1000mg /300mg  Omega 3    . polyethylene glycol-electrolytes (NULYTELY) 420 g solution See admin instructions.    . pravastatin (PRAVACHOL) 20 MG tablet Take 20 mg by mouth at bedtime.     Marland Kitchen SHINGRIX injection     . tiZANidine (ZANAFLEX) 2 MG tablet Take 1 tablet by mouth twice daily 180 tablet 0  . traMADol (ULTRAM) 50 MG tablet Take 1 tablet (50 mg total) by mouth every 8 (eight) hours as needed. no more than 3 per day 90 tablet 5   No current facility-administered medications for this visit.    ROS:   General:  No weight loss, Fever, chills  HEENT: No recent headaches, no nasal bleeding, no visual changes, no sore throat  Neurologic: No dizziness, blackouts, seizures. No recent symptoms of stroke or mini- stroke. No recent episodes of slurred speech, or temporary blindness.  Cardiac: No recent episodes of chest pain/pressure, no shortness of breath at rest.  No shortness of breath with exertion.  Denies history of atrial fibrillation or irregular heartbeat  Vascular: No history of rest pain in feet.  No history of claudication.  No history of non-healing ulcer, No history of DVT   Pulmonary: No home oxygen, no productive cough, no hemoptysis,  No asthma or wheezing  Musculoskeletal:  [ ]  Arthritis, [ ]  Low back pain,  [ ]  Joint pain  Hematologic:No history of hypercoagulable state.  No history of easy bleeding.  No history of anemia  Gastrointestinal: No hematochezia or melena,  No gastroesophageal reflux, no trouble swallowing  Urinary: [ ]  chronic Kidney disease, [ ]  on HD - [ ]  MWF or [ ]  TTHS, [ ]  Burning with urination, [ ]  Frequent urination, [ ]  Difficulty urinating;   Skin: No rashes  Psychological: No history of anxiety,  No history of depression   Physical Examination  Vitals:   05/26/20 1453  BP: 110/70  Resp: 20  Weight: 107 lb 14.4 oz (48.9 kg)  Height: 4\' 9"  (1.448 m)    Body mass index is 23.35 kg/m.  General:  Alert and oriented, no  acute distress HEENT: Normal Neck: No bruit or JVD Pulmonary: Clear to auscultation bilaterally Cardiac: Regular Rate and Rhythm without murmur Gastrointestinal: Soft, non-tender, non-distended, no mass, no scars Skin: No rash Extremity Pulses:  2+ radial, brachial, femoral, dorsalis pedis, posterior tibial pulses bilaterally Musculoskeletal: No deformity or edema  Neurologic: Upper and lower extremity motor 5/5 and symmetric  DATA:     Abdominal Aorta Findings:  +-----------+-------+----------+----------+--------+--------+--------+  Location  AP (cm)Trans (cm)PSV (cm/s)WaveformThrombusComments  +-----------+-------+----------+----------+--------+--------+--------+  Proximal  1.94  1.83   56                  +-----------+-------+----------+----------+--------+--------+--------+  Mid    4.60  4.65   49                  +-----------+-------+----------+----------+--------+--------+--------+  Distal   2.07  2.00   94                  +-----------+-------+----------+----------+--------+--------+--------+  RT CIA Prox1.0  1.0    101                  +-----------+-------+----------+----------+--------+--------+--------+  LT CIA Prox1.3  1.3    102                  +-----------+-------+----------+----------+--------+--------+--------+   Summary:  Abdominal Aorta: There is evidence of abnormal dilatation of the mid  Abdominal aorta. The largest aortic measurement is 4.6 cm. The largest  aortic diameter has increased compared to prior exam. Previous diameter  measurement was 4.4 cm obtained on  11/07/2019.    ASSESSMENT/PLAN: AAA  Her aneurysm has increased 2.5 mm in the last 6 months.  She remains asymptomatic without abdominal or lumbar pain.  The mid diameter is now 4.65.  We will repeat the duplex in 6 months.  If she develops  symptoms as above she will call 911.     Roxy Horseman PA-C [facility] Office: [phone]  MD in clinic [place]

## 2020-06-02 ENCOUNTER — Other Ambulatory Visit: Payer: Self-pay

## 2020-06-02 DIAGNOSIS — I714 Abdominal aortic aneurysm, without rupture, unspecified: Secondary | ICD-10-CM

## 2020-06-07 ENCOUNTER — Telehealth: Payer: Self-pay

## 2020-06-07 NOTE — Telephone Encounter (Signed)
Patient called to see about the "air in her lungs." Gave her the spO2 reading of 95% at her last appt. She says she checks it at home and it reads anywhere from 100 to 60s. Advised patient if she was not having difficulty breathing, the very low readings were probably inaccurate and could be due to cold fingers, nail polish etc. She says her PCP told her to go to ED if readings were below 90% - I advised her this was a good idea if she was SOB/having any difficulty breathing. She is also going to try and fax [facility] a living will.

## 2020-06-09 DIAGNOSIS — E1142 Type 2 diabetes mellitus with diabetic polyneuropathy: Secondary | ICD-10-CM | POA: Diagnosis not present

## 2020-06-09 DIAGNOSIS — I10 Essential (primary) hypertension: Secondary | ICD-10-CM | POA: Diagnosis not present

## 2020-06-09 DIAGNOSIS — E785 Hyperlipidemia, unspecified: Secondary | ICD-10-CM | POA: Diagnosis not present

## 2020-06-09 DIAGNOSIS — E1169 Type 2 diabetes mellitus with other specified complication: Secondary | ICD-10-CM | POA: Diagnosis not present

## 2020-06-09 DIAGNOSIS — M810 Age-related osteoporosis without current pathological fracture: Secondary | ICD-10-CM | POA: Diagnosis not present

## 2020-06-09 DIAGNOSIS — J449 Chronic obstructive pulmonary disease, unspecified: Secondary | ICD-10-CM | POA: Diagnosis not present

## 2020-06-10 ENCOUNTER — Other Ambulatory Visit: Payer: Self-pay

## 2020-06-10 ENCOUNTER — Emergency Department (HOSPITAL_COMMUNITY)
Admission: EM | Admit: 2020-06-10 | Discharge: 2020-06-10 | Disposition: A | Discharge status: Home / Self Care | Payer: Medicare Other | Attending: Emergency Medicine | Admitting: Emergency Medicine

## 2020-06-10 ENCOUNTER — Encounter (HOSPITAL_COMMUNITY): Payer: Self-pay

## 2020-06-10 ENCOUNTER — Emergency Department (HOSPITAL_COMMUNITY): Payer: Medicare Other

## 2020-06-10 DIAGNOSIS — F17210 Nicotine dependence, cigarettes, uncomplicated: Secondary | ICD-10-CM | POA: Diagnosis not present

## 2020-06-10 DIAGNOSIS — R531 Weakness: Secondary | ICD-10-CM | POA: Diagnosis not present

## 2020-06-10 DIAGNOSIS — G8929 Other chronic pain: Secondary | ICD-10-CM | POA: Diagnosis not present

## 2020-06-10 DIAGNOSIS — E1169 Type 2 diabetes mellitus with other specified complication: Secondary | ICD-10-CM | POA: Insufficient documentation

## 2020-06-10 DIAGNOSIS — Z85038 Personal history of other malignant neoplasm of large intestine: Secondary | ICD-10-CM | POA: Diagnosis not present

## 2020-06-10 DIAGNOSIS — M545 Low back pain, unspecified: Secondary | ICD-10-CM | POA: Diagnosis not present

## 2020-06-10 DIAGNOSIS — Z7982 Long term (current) use of aspirin: Secondary | ICD-10-CM | POA: Diagnosis not present

## 2020-06-10 DIAGNOSIS — E1140 Type 2 diabetes mellitus with diabetic neuropathy, unspecified: Secondary | ICD-10-CM | POA: Insufficient documentation

## 2020-06-10 DIAGNOSIS — M5459 Other low back pain: Secondary | ICD-10-CM | POA: Diagnosis not present

## 2020-06-10 DIAGNOSIS — E785 Hyperlipidemia, unspecified: Secondary | ICD-10-CM | POA: Insufficient documentation

## 2020-06-10 DIAGNOSIS — Z743 Need for continuous supervision: Secondary | ICD-10-CM | POA: Diagnosis not present

## 2020-06-10 DIAGNOSIS — Z79899 Other long term (current) drug therapy: Secondary | ICD-10-CM | POA: Insufficient documentation

## 2020-06-10 DIAGNOSIS — I10 Essential (primary) hypertension: Secondary | ICD-10-CM | POA: Diagnosis not present

## 2020-06-10 DIAGNOSIS — R410 Disorientation, unspecified: Secondary | ICD-10-CM | POA: Diagnosis not present

## 2020-06-10 DIAGNOSIS — R519 Headache, unspecified: Secondary | ICD-10-CM | POA: Diagnosis not present

## 2020-06-10 MED ORDER — DICLOFENAC SODIUM 1 % EX GEL: 4.0000 g | Freq: Four times a day (QID) | CUTANEOUS | 0 refills | Status: DC

## 2020-06-10 MED ORDER — LIDOCAINE 5 % EX PTCH: 1.0000 | MEDICATED_PATCH | CUTANEOUS | 0 refills | Status: DC

## 2020-06-10 MED ORDER — ACETAMINOPHEN 500 MG PO TABS
1000.0000 mg | ORAL_TABLET | Freq: Once | ORAL | Status: DC
  Filled 2020-06-10: qty 2

## 2020-06-10 MED ORDER — LIDOCAINE 5 % EX PTCH
2.0000 | MEDICATED_PATCH | CUTANEOUS | Status: DC
  Filled 2020-06-10: qty 2

## 2020-06-10 NOTE — ED Provider Notes (Addendum)
[facility] DEPT Provider Note   CSN: [id number] Arrival date & time: 06/10/20  0148     History Chief Complaint  Patient presents with  . Back Pain    Kylie Hurst is a 75 y.o. female.  The history is provided by the patient.  Back Pain Location:  Lumbar spine and sacro-iliac joint Quality:  Cramping Radiates to:  Does not radiate Pain severity:  Severe Pain is:  Same all the time Onset quality:  Gradual Duration:  96 months Timing:  Constant Progression:  Worsening Chronicity:  Chronic Context: not physical stress, not recent illness, not recent injury and not twisting   Relieved by:  Nothing Worsened by:  Nothing Ineffective treatments: ultram, sees pain management  Associated symptoms: no abdominal pain, no abdominal swelling, no bladder incontinence, no bowel incontinence, no chest pain, no dysuria, no fever, no headaches, no leg pain, no numbness, no paresthesias, no pelvic pain, no perianal numbness, no tingling, no weakness and no weight loss   Risk factors: no hx of cancer   Patient with chronic back pain followed by pain management states her chronic narcotics are no longer working and she does not want to wait until Friday to have them changed.  No abdominal pain.  No urinary symptoms.  No dysuria, no hematuria.  No flank pain.  No bowel symptoms.  No weakness no numbness.  Also has had a pain over her eye for some time.  No rashes.  No changes in vision or speech.  No neck pain.  Patient would like all her ongoing health issues looked into tonight.      Past Medical History:  Diagnosis Date  . Aortic aneurysm ([place]) 2011   3.41mm  . Arthritis   . Back spasm   . Bulging discs   . Chronic back pain   . Depression   . Diabetes mellitus without complication (HCC)    fasting 90-110  . Emphysema   . Environmental and seasonal allergies   . Hyperlipidemia   . Hypertension   . Neuromuscular disorder ([place])   . Onycholysis of  toenail   . Osteoporosis   . Ovarian cyst 06/01/2009   5 cm  . Restless legs   . Spigelian hernia    left ventral  . Wears glasses     Patient Active Problem List   Diagnosis Date Noted  . Acquired hammer toe of left foot 05/26/2020  . Allergic rhinitis due to pollen 05/26/2020  . Atherosclerosis 05/26/2020  . Benign neoplasm of colon 05/26/2020  . Chronic obstructive pulmonary disease, unspecified ([place]) 05/26/2020  . Constipation 05/26/2020  . Decreased estrogen level 05/26/2020  . Hardening of the aorta (main artery of the heart) ([place]) 05/26/2020  . Hypercalcemia 05/26/2020  . Hyperlipidemia 05/26/2020  . Hypertension 05/26/2020  . Hypokalemia 05/26/2020  . Osteoporosis 05/26/2020  . Personal history of colonic polyps 05/26/2020  . Rectocele 05/26/2020  . Skin sensation disturbance 05/26/2020  . Tobacco dependence 05/26/2020  . Vaginal bleeding 05/26/2020  . Pain due to onychomycosis of toenails of both feet 07/17/2018  . Diabetes mellitus without complication ([place]) 08/65/7846  . 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 Right lumbar radiculopathy 05/30/2013  . Diabetic neuropathy ([place]) 05/30/2013  . Cervical spondylosis without myelopathy 12/02/2012  . Lumbosacral spondylosis without myelopathy 12/02/2012  . Spondylolisthesis at L5-S1 level 12/02/2012  . AAA (abdominal aortic aneurysm) without rupture ([place]) 07/03/2012    Past Surgical History:  Procedure Laterality Date  . ANTERIOR CERVICAL DECOMP/DISCECTOMY  FUSION N/A 12/18/2012   Procedure: ANTERIOR CERVICAL DECOMPRESSION/DISCECTOMY FUSION 1 LEVEL;  Surgeon: Sinclair Ship, MD;  Location: [facility];  Service: Orthopedics;  Laterality: N/A;  Anterior cervical decompression fusion, cervical 7 - thoracic 1 with instrumentation, allograft  . COLONOSCOPY    . INSERTION OF MESH Left 05/14/2014   Procedure: INSERTION OF MESH;  Surgeon: Donnie Mesa, MD;  Location: [facility];  Service: General;  Laterality: Left;  . NECK SURGERY    .  SPIGELIAN HERNIA  05/14/2014  . VENTRAL HERNIA REPAIR Left 05/14/2014   Procedure: ATTEMPTED LAPAROSCOPIC REPAIR LEFT SPIGLIAN HERINA CONVERTED TO OPEN REPAIR;  Surgeon: Donnie Mesa, MD;  Location: [facility];  Service: General;  Laterality: Left;     OB History   No obstetric history on file.     Family History  Problem Relation Age of Onset  . Diabetes Mother   . Cancer Sister   . Epilepsy Brother     Social History   Tobacco Use  . Smoking status: Current Every Day Smoker    Packs/day: 1.00    Years: 52.00    Pack years: 52.00    Types: Cigarettes  . Smokeless tobacco: Never Used  . Tobacco comment: Currently on Chantix  Vaping Use  . Vaping Use: Never used  Substance Use Topics  . Alcohol use: No  . Drug use: No    Home Medications Prior to Admission medications   Medication Sig Start Date End Date Taking? Authorizing Provider  diclofenac Sodium (VOLTAREN) 1 % GEL Apply 4 g topically 4 (four) times daily. 06/10/20  Yes Areyanna Figeroa, MD  lidocaine (LIDODERM) 5 % Place 1 patch onto the skin daily. Remove & Discard patch within 12 hours or as directed by MD 06/10/20  Yes Amaani Guilbault, MD  ACCU-CHEK AVIVA PLUS test strip USE AS DIRECTED TO TEST BLOOD SUGAR DAILY 02/21/18   [provider]  albuterol (VENTOLIN HFA) 108 (90 Base) MCG/ACT inhaler SMARTSIG:1 Puff(s) By Mouth Every 4 Hours PRN 02/10/20   [provider]  Celedonio Miyamoto 62.5-25 MCG/INH AEPB  08/11/16   [provider]  Ascorbic Acid (VITAMIN C) 1000 MG tablet Take by mouth 2 (two) times daily. Patient takes one tablet (1000 mg) daily during Spring, Summer, and Fall. Patient takes one tablet (1000 mg) twice daily in the Winter.    [provider]  aspirin (ASPIRIN CHILDRENS) 81 MG chewable tablet Chew 1 tablet (81 mg total) by mouth daily. 07/03/14   Kirsteins, Luanna Salk, MD  b complex vitamins tablet Take 1 tablet by mouth daily.    [provider]  BIOTIN PO Take 500 mg by  mouth daily.     [provider]  Calcium Carbonate-Vitamin D 600-400 MG-UNIT per tablet Take 1 tablet by mouth daily.     [provider]  cetirizine (ZYRTEC) 10 MG tablet Take 10 mg by mouth at bedtime.    [provider]  denosumab (PROLIA) 60 MG/ML SOSY injection Inject 60 mg into the skin every 6 (six) months.    [provider]  fexofenadine (ALLEGRA) 180 MG tablet 1 tablet as needed    [provider]  fluticasone (FLONASE) 50 MCG/ACT nasal spray Place 2 sprays into both nostrils daily.    [provider]  hydrochlorothiazide (MICROZIDE) 12.5 MG capsule Take 12.5 mg by mouth daily. 05/12/15   [provider]  KLOR-CON M20 20 MEQ tablet Take 20 mEq daily by mouth. 12/07/16   [provider]  lisinopril (PRINIVIL,ZESTRIL) 5 MG tablet Take 5 mg by mouth at bedtime.     [provider]  Multiple Vitamins-Minerals (MULTIVITAMIN WITH MINERALS) tablet Take 1 tablet by mouth every evening.     [provider]  Omega-3 Fatty Acids (FISH OIL) 1000 MG CAPS Take by mouth. Fish oil 1000mg /300mg  Omega 3    [provider]  polyethylene glycol-electrolytes (NULYTELY) 420 g solution See admin instructions. 02/03/20   [provider]  pravastatin (PRAVACHOL) 20 MG tablet Take 20 mg by mouth at bedtime.     [provider]  [employer] injection  08/08/16   [provider]  tiZANidine (ZANAFLEX) 2 MG tablet Take 1 tablet by mouth twice daily 01/23/20   Kirsteins, Luanna Salk, MD  traMADol (ULTRAM) 50 MG tablet Take 1 tablet (50 mg total) by mouth every 8 (eight) hours as needed. no more than 3 per day 12/12/19   Bayard Hugger, NP    Allergies    Other  Review of Systems   Review of Systems  Constitutional: Negative for fever and weight loss.  Cardiovascular: Negative for chest pain.  Gastrointestinal: Negative for abdominal pain and bowel incontinence.  Genitourinary: Negative for  bladder incontinence, dysuria and pelvic pain.  Musculoskeletal: Positive for back pain.  Neurological: Negative for tingling, weakness, numbness, headaches and paresthesias.    Physical Exam Updated Vital Signs BP 131/77 (BP Location: Right Arm)   Pulse 78   Temp 98.5 F (36.9 C) (Oral)   Resp 16   SpO2 99%   Physical Exam Vitals and nursing note reviewed.  Constitutional:      General: She is not in acute distress.    Appearance: Normal appearance.  HENT:     Head: Normocephalic and atraumatic.     Right Ear: Tympanic membrane normal.     Left Ear: Tympanic membrane normal.     Nose: Nose normal.     Mouth/Throat:     Mouth: Mucous membranes are moist.     Pharynx: Oropharynx is clear.  Eyes:     General: No scleral icterus.    Extraocular Movements: Extraocular movements intact.     Conjunctiva/sclera: Conjunctivae normal.     Pupils: Pupils are equal, round, and reactive to light.  Cardiovascular:     Rate and Rhythm: Normal rate and regular rhythm.     Pulses: Normal pulses.     Heart sounds: Normal heart sounds.  Pulmonary:     Effort: Pulmonary effort is normal.     Breath sounds: Normal breath sounds.  Abdominal:     General: Abdomen is flat. Bowel sounds are normal.     Palpations: Abdomen is soft.     Tenderness: There is no abdominal tenderness. There is no guarding.  Musculoskeletal:        General: Normal range of motion.     Cervical back: Normal, normal range of motion and neck supple. No rigidity or tenderness.     Thoracic back: Normal. No spasms or tenderness.     Lumbar back: Normal. No spasms.     Comments: 5/5 throughout  Lymphadenopathy:     Cervical: No cervical adenopathy.  Skin:    General: Skin is warm and dry.     Capillary Refill: Capillary refill takes less than 2 seconds.  Neurological:     General: No focal deficit present.     Mental Status: She is alert and oriented to person, place, and time.     Cranial Nerves: No  cranial  nerve deficit.     Sensory: No sensory deficit.     Deep Tendon Reflexes: Reflexes normal.  Psychiatric:        Mood and Affect: Mood normal.     ED Results / Procedures / Treatments   Labs (all labs ordered are listed, but only abnormal results are displayed) Labs Reviewed - No data to display  EKG None  Radiology DG Lumbar Spine Complete  Addendum Date: 06/10/2020   ADDENDUM REPORT: 06/10/2020 06:07 ADDENDUM: Per Dr. Randal Buba, patient is followed by vascular surgery and had a recent ultrasound of the abdominal aorta on which the aortic aneurysm measured up to 4.4 cm which is consistent with a size noted on x-ray lumbar spine given differences in technique. No CTA indicated (unlike mentioned above). These results were called by telephone at the time of interpretation on 06/10/2020 at 6:05 am to provider [facility] , who verbally acknowledged these results. Electronically Signed   By: Iven Finn M.D.   On: 06/10/2020 06:07   Result Date: 06/10/2020 CLINICAL DATA:  Pain.  No injury. EXAM: LUMBAR SPINE - COMPLETE 4+ VIEW COMPARISON:  CT pelvis 04/30/2012. FINDINGS: Five non-rib-bearing lumbar vertebral bodies. Multilevel severe degenerative changes of the spine. There is no evidence of lumbar spine fracture. Grade 1 anterolisthesis of L5 on S1 stable, associated L5 pars interarticularis defects. Atherosclerotic plaque. Infrarenal abdominal aorta aneurysm possibly increased in size. IMPRESSION: 1. Likely interval increase in size of an infrarenal abdominal aorta aneurysm. Aortic aneurysm NOS (ICD10-I71.9). Aortic Atherosclerosis (ICD10-I70.0). Recommend CTA for further evaluation. 2. No definite acute displaced fracture or traumatic listhesis with limited evaluation of the spine in a patient with multilevel severe degenerative changes. These results will be called to the ordering clinician or representative by the Radiologist Assistant, and communication documented in the PACS or [employer]. Electronically Signed: By: Iven Finn M.D. On: 06/10/2020 06:02   CT Head Wo Contrast  Result Date: 06/10/2020 CLINICAL DATA:  Maxillofacial pain.  Confusion and headache. EXAM: CT HEAD WITHOUT CONTRAST TECHNIQUE: Contiguous axial images were obtained from the base of the skull through the vertex without intravenous contrast. COMPARISON:  None. FINDINGS: Brain: Cerebral ventricle sizes are concordant with the degree of cerebral volume loss. Patchy and confluent areas of decreased attenuation are noted throughout the deep and periventricular white matter of the cerebral hemispheres bilaterally, compatible with chronic microvascular ischemic disease. No evidence of large-territorial acute infarction. No parenchymal hemorrhage. No mass lesion. No extra-axial collection. No mass effect or midline shift. No hydrocephalus. Basilar cisterns are patent. Vascular: No hyperdense vessel. Atherosclerotic calcifications are present within the cavernous internal carotid arteries. Skull: No acute fracture or focal lesion. Sinuses/Orbits: Paranasal sinuses and mastoid air cells are clear. The orbits are unremarkable. Other: None. IMPRESSION: No acute intracranial abnormality in a patient with diffuse atrophy and chronic microvascular ischemic changes. Electronically Signed   By: Iven Finn M.D.   On: 06/10/2020 05:35    Procedures Procedures   Medications Ordered in ED Medications  lidocaine (LIDODERM) 5 % 2 patch (has no administration in time range)  acetaminophen (TYLENOL) tablet 1,000 mg (has no administration in time range)    ED Course  I have reviewed the triage vital signs and the nursing notes.  Pertinent labs & imaging results that were available during my care of the patient were reviewed by me and considered in my medical decision making (see chart for details).    Patient is followed by vascular, closely for her  AAA, she had duplex of the AAA done on 06/02/20.  She is not  complaining of change in pain since that time.  There is no pulsatile mass. I do not believe this requires repeat imaging at this time.  Patient has refused all medication in the ED.  I have instructed her to follow up with her PMD and pain management team to discuss her regimen for chronic back pain.  No acute findings on head CT.  No rashes on the skin.  No signs of shingles.  Patient has refused all reasonable interventions and only wants changes to her narcotics and someone to fix all her issues because she "cannot wait to see my doctor because my appointments are in 6 months and I don't want to call them." EDP (with nurse present) politely explained that the ED is for life threatening emergencies and that we do not change people's chronic pain medications.  I explained that the patient can call her doctors to request sooner appointments.  She was not interested in this.  I suspect there is a component of drug seeking.    Kylie Hurst was evaluated in Emergency Department on 06/10/2020 for the symptoms described in the history of present illness. She was evaluated in the context of the global COVID-19 pandemic, which necessitated consideration that the patient might be at risk for infection with the SARS-CoV-2 virus that causes COVID-19. Institutional protocols and algorithms that pertain to the evaluation of patients at risk for COVID-19 are in a state of rapid change based on information released by regulatory bodies including the CDC and federal and state organizations. These policies and algorithms were followed during the patient's care in the ED.  Final Clinical Impression(s) / ED Diagnoses Final diagnoses:  Chronic low back pain without sciatica, unspecified back pain laterality   Return for intractable cough, coughing up blood, fevers >100.4 unrelieved by medication, shortness of breath, intractable vomiting, chest pain, shortness of breath, weakness, numbness, changes in speech, facial  asymmetry, abdominal pain, passing out, Inability to tolerate liquids or food, cough, altered mental status or any concerns. No signs of systemic illness or infection. The patient is nontoxic-appearing on exam and vital signs are within normal limits.  I have reviewed the triage vital signs and the nursing notes. Pertinent labs & imaging results that were available during my care of the patient were reviewed by me and considered in my medical decision making (see chart for details). After history, exam, and medical workup I feel the patient has been appropriately medically screened and is safe for discharge home. Pertinent diagnoses were discussed with the patient. Patient was given return precautions.  Rx / DC Orders ED Discharge Orders         Ordered    diclofenac Sodium (VOLTAREN) 1 % GEL  4 times daily        06/10/20 0517    lidocaine (LIDODERM) 5 %  Every 24 hours        06/10/20 Parsons, Dorrien Grunder, MD 06/10/20 Barry, Ashawn Rinehart, MD 06/10/20 [id number]

## 2020-06-10 NOTE — ED Notes (Signed)
Pt refused tylenol and lidocaine patch. MD aware.

## 2020-06-10 NOTE — ED Triage Notes (Signed)
Pt arrives EMS from home with c/o lower back pain and leg numbness that has been on-going since 2014.

## 2020-06-11 ENCOUNTER — Encounter: Payer: Self-pay | Admitting: Physical Medicine & Rehabilitation

## 2020-06-11 ENCOUNTER — Encounter: Payer: Medicare Other | Attending: Physical Medicine & Rehabilitation | Admitting: Physical Medicine & Rehabilitation

## 2020-06-11 VITALS — BP 123/75 | HR 80 | Temp 98.8°F | Ht <= 58 in | Wt 107.8 lb

## 2020-06-11 DIAGNOSIS — Z79899 Other long term (current) drug therapy: Secondary | ICD-10-CM | POA: Diagnosis not present

## 2020-06-11 DIAGNOSIS — Z5181 Encounter for therapeutic drug level monitoring: Secondary | ICD-10-CM | POA: Diagnosis not present

## 2020-06-11 DIAGNOSIS — G894 Chronic pain syndrome: Secondary | ICD-10-CM | POA: Diagnosis not present

## 2020-06-11 MED ORDER — TRAMADOL HCL 50 MG PO TABS: 50.0000 mg | ORAL_TABLET | Freq: Three times a day (TID) | ORAL | 5 refills | Status: DC | PRN

## 2020-06-11 MED ORDER — TIZANIDINE HCL 2 MG PO TABS: 2.0000 mg | ORAL_TABLET | Freq: Two times a day (BID) | ORAL | 0 refills | Status: DC

## 2020-06-11 NOTE — Progress Notes (Signed)
Subjective:    Patient ID: Kylie Hurst, female    DOB: May 30, 1945, 75 y.o.   MRN: [id number]  HPI  75 year old female with spondylolisthesis at L5-S1 with chronic low back pain.  In addition she has diabetic neuropathy.  She has burning pain of her feet as well as decreased sensation.  She has reduced balance.  She has had some increased pain and she went for x-ray of her lumbar spine which did not show any significant changes compared to prior imaging studies she did have incidental finding of AAA ~4 cm which was already identified by vascular surgery and she follows with them. The patient states that her main concern is that she is having more problem with her short-term memory.  She cannot give me examples of what concerns her.  She is paying her bills on time.  She is managing her household.  She lives in an apartment.  She is concerned that she may have Alzheimer's disease just like her aunt in [place].  She took care of her aunt for about 4 years.  The patient has what sounds like a Mini-Mental status exam set up with her primary care physician on Monday. Pt planning on going to a nursing home  Pt has increased pain with bending, no new lower extremity symptoms no new bowel or bladder symptoms.  She remains independent with all self-care and mobility. We discussed the potential sedating effect as well as memory or cognitive effects related to both tizanidine and tramadol.  She states she would like to try something stronger with for her pain however we discussed that stronger medications would also cause more cognitive impairment. On further questioning she thinks her memory problems worsened about a week ago and her balance problems worsened a couple weeks ago.  We discussed that she still smokes and she could have a small strokes or other reasons for her decline.  She plans to discuss this with her primary doctor on Monday Pain Inventory Average Pain 8 Pain Right Now 8 My pain is  burning and tingling  In the last 24 hours, has pain interfered with the following? General activity 7 Relation with others 5 Enjoyment of life 7 What TIME of day is your pain at its worst? evening and night Sleep (in general) Fair  Pain is worse with: walking, bending, sitting and standing Pain improves with: rest and medication Relief from Meds: 8  Family History  Problem Relation Age of Onset  . Diabetes Mother   . Cancer Sister   . Epilepsy Brother    Social History   Socioeconomic History  . Marital status: Divorced    Spouse name: Not on file  . Number of children: Not on file  . Years of education: Not on file  . Highest education level: Not on file  Occupational History  . Not on file  Tobacco Use  . Smoking status: Current Every Day Smoker    Packs/day: 1.00    Years: 52.00    Pack years: 52.00    Types: Cigarettes  . Smokeless tobacco: Never Used  . Tobacco comment: Currently on Chantix  Vaping Use  . Vaping Use: Never used  Substance and Sexual Activity  . Alcohol use: No  . Drug use: No  . Sexual activity: Not on file  Other Topics Concern  . Not on file  Social History Narrative  . Not on file   Social Determinants of Health   Financial Resource Strain: Not on file  Food Insecurity: Not on file  Transportation Needs: Not on file  Physical Activity: Not on file  Stress: Not on file  Social Connections: Not on file   Past Surgical History:  Procedure Laterality Date  . ANTERIOR CERVICAL DECOMP/DISCECTOMY FUSION N/A 12/18/2012   Procedure: ANTERIOR CERVICAL DECOMPRESSION/DISCECTOMY FUSION 1 LEVEL;  Surgeon: Sinclair Ship, MD;  Location: [facility];  Service: Orthopedics;  Laterality: N/A;  Anterior cervical decompression fusion, cervical 7 - thoracic 1 with instrumentation, allograft  . COLONOSCOPY    . INSERTION OF MESH Left 05/14/2014   Procedure: INSERTION OF MESH;  Surgeon: Donnie Mesa, MD;  Location: [facility];  Service: General;   Laterality: Left;  . NECK SURGERY    . SPIGELIAN HERNIA  05/14/2014  . VENTRAL HERNIA REPAIR Left 05/14/2014   Procedure: ATTEMPTED LAPAROSCOPIC REPAIR LEFT SPIGLIAN HERINA CONVERTED TO OPEN REPAIR;  Surgeon: Donnie Mesa, MD;  Location: [facility];  Service: General;  Laterality: Left;   Past Surgical History:  Procedure Laterality Date  . ANTERIOR CERVICAL DECOMP/DISCECTOMY FUSION N/A 12/18/2012   Procedure: ANTERIOR CERVICAL DECOMPRESSION/DISCECTOMY FUSION 1 LEVEL;  Surgeon: Sinclair Ship, MD;  Location: [facility];  Service: Orthopedics;  Laterality: N/A;  Anterior cervical decompression fusion, cervical 7 - thoracic 1 with instrumentation, allograft  . COLONOSCOPY    . INSERTION OF MESH Left 05/14/2014   Procedure: INSERTION OF MESH;  Surgeon: Donnie Mesa, MD;  Location: [facility];  Service: General;  Laterality: Left;  . NECK SURGERY    . SPIGELIAN HERNIA  05/14/2014  . VENTRAL HERNIA REPAIR Left 05/14/2014   Procedure: ATTEMPTED LAPAROSCOPIC REPAIR LEFT SPIGLIAN HERINA CONVERTED TO OPEN REPAIR;  Surgeon: Donnie Mesa, MD;  Location: [facility];  Service: General;  Laterality: Left;   Past Medical History:  Diagnosis Date  . Aortic aneurysm ([place]) 2011   3.62mm  . Arthritis   . Back spasm   . Bulging discs   . Chronic back pain   . Depression   . Diabetes mellitus without complication (HCC)    fasting 90-110  . Emphysema   . Environmental and seasonal allergies   . Hyperlipidemia   . Hypertension   . Neuromuscular disorder ([place])   . Onycholysis of toenail   . Osteoporosis   . Ovarian cyst 06/01/2009   5 cm  . Restless legs   . Spigelian hernia    left ventral  . Wears glasses    BP 123/75   Pulse 80   Temp 98.8 F (37.1 C)   Ht 4\' 10"  (1.473 m)   Wt 107 lb 12.8 oz (48.9 kg)   SpO2 94%   BMI 22.53 kg/m   Opioid Risk Score:   Fall Risk Score:  `1  Depression screen PHQ 2/9  Depression screen [facility] 2/9 06/11/2020 06/12/2019 07/02/2018 07/03/2017 07/05/2015  Decreased Interest 0 0  0 0 0  Down, Depressed, Hopeless 0 0 0 0 0  PHQ - 2 Score 0 0 0 0 0  Altered sleeping - - - - 0  Tired, decreased energy - - - - 0  Change in appetite - - - - 0  Feeling bad or failure about yourself  - - - - 0  Trouble concentrating - - - - 0  Moving slowly or fidgety/restless - - - - 0  Suicidal thoughts - - - - 0  PHQ-9 Score - - - - 0  Difficult doing work/chores - - - - Not difficult at all  Review of Systems  Constitutional: Negative.   HENT: Negative.   Eyes: Negative.   Respiratory:       Reports emphysema  Cardiovascular: Negative.   Gastrointestinal: Negative.   Endocrine: Negative.   Genitourinary: Negative.   Musculoskeletal: Positive for arthralgias and back pain.  Skin: Negative.   Neurological:       Reports she has alzheimers and needs to be in a facility  Hematological: Negative.   Psychiatric/Behavioral: Negative.   All other systems reviewed and are negative.      Objective:   Physical Exam Vitals reviewed.  Constitutional:      Appearance: She is normal weight.  HENT:     Head: Normocephalic and atraumatic.  Eyes:     Extraocular Movements: Extraocular movements intact.     Conjunctiva/sclera: Conjunctivae normal.     Pupils: Pupils are equal, round, and reactive to light.  Skin:    General: Skin is warm and dry.  Neurological:     Mental Status: She is alert and oriented to person, place, and time.     Comments: Motor strength is 5/5 bilateral deltoid bicep tricep grip hip flexor knee extensor ankle dorsiflexor Negative straight leg raising test bilaterally   Psychiatric:        Behavior: Behavior normal.    Tone is normal bilateral upper and lower limbs There is no evidence of facial droop Extraocular muscles intact Ambulates without assistive device no evidence of toe drag or knee instability.       Assessment & Plan:  Impression 1.  Chronic low back pain with history of lumbar spondylolisthesis no evidence of progression  radiologically.  No signs of neurogenic claudication.  As discussed with patient I do not want to increase any of her medications would continue tramadol 50 3 times daily as needed as well as tizanidine 2 mg twice daily as needed.  We discussed that she would be a good candidate for physical therapy however she does not wish to go because she would have to hire somebody to drive her. 2.  History of memory loss we discussed that there is many potential possibilities especially if it was recent onset, the patient plans to discuss this with her primary care physician on Monday.  Patient feels like she may be going to a skilled nursing facility in the near future.  We discussed that if this is the case that the physician at the facility would prescribe all of her medications.  If she remains living independently at home, she should follow-up in physical medicine rehab clinic in 6 months

## 2020-06-11 NOTE — Patient Instructions (Signed)
If you go to a nursing home, the doctor there can prescribe your medications

## 2020-06-14 DIAGNOSIS — R410 Disorientation, unspecified: Secondary | ICD-10-CM | POA: Diagnosis not present

## 2020-06-17 LAB — DRUG TOX MONITOR 1 W/CONF, ORAL FLD
Amphetamines: NEGATIVE ng/mL (ref <10)
Barbiturates: NEGATIVE ng/mL (ref <10)
Benzodiazepines: NEGATIVE ng/mL (ref <0.50)
Buprenorphine: NEGATIVE ng/mL (ref <0.10)
Cocaine: NEGATIVE ng/mL (ref <5.0)
Cotinine: 115 ng/mL — ABNORMAL HIGH (ref <5.0)
Fentanyl: NEGATIVE ng/mL (ref <0.10)
Heroin Metabolite: NEGATIVE ng/mL (ref <1.0)
MARIJUANA: NEGATIVE ng/mL (ref <2.5)
MDMA: NEGATIVE ng/mL (ref <10)
Meprobamate: NEGATIVE ng/mL (ref <2.5)
Methadone: NEGATIVE ng/mL (ref <5.0)
Nicotine Metabolite: POSITIVE ng/mL — AB (ref <5.0)
Opiates: NEGATIVE ng/mL (ref <2.5)
Phencyclidine: NEGATIVE ng/mL (ref <10)
Tapentadol: NEGATIVE ng/mL (ref <5.0)
Tramadol: >500 ng/mL — ABNORMAL HIGH (ref <5.0)
Tramadol: POSITIVE ng/mL — AB (ref <5.0)
Zolpidem: NEGATIVE ng/mL (ref <5.0)

## 2020-06-17 LAB — DRUG TOX ALC METAB W/CON, ORAL FLD: Alcohol Metabolite: NEGATIVE ng/mL (ref <25)

## 2020-06-18 ENCOUNTER — Emergency Department (HOSPITAL_COMMUNITY): Payer: Medicare Other

## 2020-06-18 ENCOUNTER — Encounter (HOSPITAL_COMMUNITY): Payer: Self-pay | Admitting: Emergency Medicine

## 2020-06-18 ENCOUNTER — Other Ambulatory Visit: Payer: Self-pay

## 2020-06-18 ENCOUNTER — Emergency Department (HOSPITAL_COMMUNITY)
Admission: EM | Admit: 2020-06-18 | Discharge: 2020-06-18 | Disposition: A | Discharge status: Home / Self Care | Payer: Medicare Other | Attending: Emergency Medicine | Admitting: Emergency Medicine

## 2020-06-18 DIAGNOSIS — G319 Degenerative disease of nervous system, unspecified: Secondary | ICD-10-CM | POA: Diagnosis not present

## 2020-06-18 DIAGNOSIS — R410 Disorientation, unspecified: Secondary | ICD-10-CM | POA: Diagnosis not present

## 2020-06-18 DIAGNOSIS — Z743 Need for continuous supervision: Secondary | ICD-10-CM | POA: Diagnosis not present

## 2020-06-18 DIAGNOSIS — R531 Weakness: Secondary | ICD-10-CM | POA: Diagnosis not present

## 2020-06-18 DIAGNOSIS — F17210 Nicotine dependence, cigarettes, uncomplicated: Secondary | ICD-10-CM | POA: Insufficient documentation

## 2020-06-18 DIAGNOSIS — E1140 Type 2 diabetes mellitus with diabetic neuropathy, unspecified: Secondary | ICD-10-CM | POA: Diagnosis not present

## 2020-06-18 DIAGNOSIS — Z79899 Other long term (current) drug therapy: Secondary | ICD-10-CM | POA: Insufficient documentation

## 2020-06-18 DIAGNOSIS — Z85038 Personal history of other malignant neoplasm of large intestine: Secondary | ICD-10-CM | POA: Diagnosis not present

## 2020-06-18 DIAGNOSIS — Z7951 Long term (current) use of inhaled steroids: Secondary | ICD-10-CM | POA: Diagnosis not present

## 2020-06-18 DIAGNOSIS — F0390 Unspecified dementia without behavioral disturbance: Secondary | ICD-10-CM | POA: Diagnosis not present

## 2020-06-18 DIAGNOSIS — J449 Chronic obstructive pulmonary disease, unspecified: Secondary | ICD-10-CM | POA: Insufficient documentation

## 2020-06-18 DIAGNOSIS — R6889 Other general symptoms and signs: Secondary | ICD-10-CM | POA: Diagnosis not present

## 2020-06-18 DIAGNOSIS — I10 Essential (primary) hypertension: Secondary | ICD-10-CM | POA: Insufficient documentation

## 2020-06-18 DIAGNOSIS — Z7982 Long term (current) use of aspirin: Secondary | ICD-10-CM | POA: Insufficient documentation

## 2020-06-18 DIAGNOSIS — J439 Emphysema, unspecified: Secondary | ICD-10-CM | POA: Diagnosis not present

## 2020-06-18 DIAGNOSIS — R413 Other amnesia: Secondary | ICD-10-CM | POA: Insufficient documentation

## 2020-06-18 HISTORY — DX: Unspecified dementia, unspecified severity, without behavioral disturbance, psychotic disturbance, mood disturbance, and anxiety: F03.90

## 2020-06-18 LAB — CBC WITH DIFFERENTIAL/PLATELET
Abs Immature Granulocytes: 0.02 10*3/uL (ref 0.00–0.07)
Basophils Absolute: 0.1 10*3/uL (ref 0.0–0.1)
Basophils Relative: 1 %
Eosinophils Absolute: 0.1 10*3/uL (ref 0.0–0.5)
Eosinophils Relative: 1 %
HCT: 47.4 % — ABNORMAL HIGH (ref 36.0–46.0)
Hemoglobin: 16.2 g/dL — ABNORMAL HIGH (ref 12.0–15.0)
Immature Granulocytes: 0 %
Lymphocytes Relative: 35 %
Lymphs Abs: 2.8 10*3/uL (ref 0.7–4.0)
MCH: 33.2 pg (ref 26.0–34.0)
MCHC: 34.2 g/dL (ref 30.0–36.0)
MCV: 97.1 fL (ref 80.0–100.0)
Monocytes Absolute: 0.5 10*3/uL (ref 0.1–1.0)
Monocytes Relative: 7 %
Neutro Abs: 4.5 10*3/uL (ref 1.7–7.7)
Neutrophils Relative %: 56 %
Platelets: 175 10*3/uL (ref 150–400)
RBC: 4.88 MIL/uL (ref 3.87–5.11)
RDW: 12.8 % (ref 11.5–15.5)
WBC: 8 10*3/uL (ref 4.0–10.5)
nRBC: 0 % (ref 0.0–0.2)

## 2020-06-18 LAB — COMPREHENSIVE METABOLIC PANEL
ALT: 22 U/L (ref 0–44)
AST: 24 U/L (ref 15–41)
Albumin: 4.2 g/dL (ref 3.5–5.0)
Alkaline Phosphatase: 41 U/L (ref 38–126)
Anion gap: 8 (ref 5–15)
BUN: 17 mg/dL (ref 8–23)
CO2: 27 mmol/L (ref 22–32)
Calcium: 10 mg/dL (ref 8.9–10.3)
Chloride: 102 mmol/L (ref 98–111)
Creatinine, Ser: 0.78 mg/dL (ref 0.44–1.00)
GFR, Estimated: >60 mL/min (ref >60)
Glucose, Bld: 95 mg/dL (ref 70–99)
Potassium: 3.6 mmol/L (ref 3.5–5.1)
Sodium: 137 mmol/L (ref 135–145)
Total Bilirubin: 0.7 mg/dL (ref 0.3–1.2)
Total Protein: 7.1 g/dL (ref 6.5–8.1)

## 2020-06-18 LAB — TSH: TSH: 1.526 u[IU]/mL (ref 0.350–4.500)

## 2020-06-18 LAB — URINALYSIS, ROUTINE W REFLEX MICROSCOPIC
Bilirubin Urine: NEGATIVE
Glucose, UA: NEGATIVE mg/dL
Hgb urine dipstick: NEGATIVE
Ketones, ur: NEGATIVE mg/dL
Nitrite: NEGATIVE
Protein, ur: NEGATIVE mg/dL
Specific Gravity, Urine: 1.011 (ref 1.005–1.030)
pH: 6 (ref 5.0–8.0)

## 2020-06-18 LAB — RAPID URINE DRUG SCREEN, HOSP PERFORMED
Amphetamines: NOT DETECTED
Barbiturates: NOT DETECTED
Benzodiazepines: NOT DETECTED
Cocaine: NOT DETECTED
Opiates: NOT DETECTED
Tetrahydrocannabinol: NOT DETECTED

## 2020-06-18 LAB — ETHANOL: Alcohol, Ethyl (B): <10 mg/dL (ref <10)

## 2020-06-18 MED ORDER — SODIUM CHLORIDE 0.9 % IV BOLUS: 500.0000 mL | Freq: Once | INTRAVENOUS | Status: DC

## 2020-06-18 NOTE — Discharge Planning (Signed)
RNCM consulted regarding memory loss resources.  RNCM provided list of assisted living facilities and memory care facilities on AVS.

## 2020-06-18 NOTE — ED Notes (Signed)
Patient came into the ED via wheelchair.  Standing at bedside during triage when the patient mentioned that she felt like she was having a stroke.  Asked when the symptoms started, patient states that she doesn't remember.  Patient asked what are the symptoms of a stroke.  Tried to perform a stroke scale on the patient after the patient sat down on the bed by herself.  Patient states that she is not able to move her legs or arms.

## 2020-06-18 NOTE — ED Triage Notes (Signed)
Patient arrived with EMS from home , patient stated "my doctor diagnosed me with dementia" , she added frequent short tem memory loss , flights of ideas during encounter , denies SOB , mild neck pain .

## 2020-06-18 NOTE — ED Provider Notes (Signed)
[facility] EMERGENCY DEPARTMENT Provider Note   CSN: [id number] Arrival date & time: 06/18/20  0409     History Chief Complaint  Patient presents with  . Memory Problem    Kylie Hurst is a 75 y.o. female.  Patient with history of aortic aneurysm, back pain chronic from disc disease, emphysema, diabetes, lives alone, tobacco dependence presents with memory loss concerns and confusion.  Patient unsure when this started however has seen the patient's primary doctor for this recently.  Patient has history of depression.  Patient has been on tramadol and muscle relaxant for back pain recently.  Patient feels generally weak.  Denies unilateral signs or symptoms, denies stroke history.  Difficulty obtaining detailed history so utilized medical record as well.        Past Medical History:  Diagnosis Date  . Aortic aneurysm ([place]) 2011   3.72mm  . Arthritis   . Back spasm   . Bulging discs   . Chronic back pain   . Dementia ([place])   . Depression   . Diabetes mellitus without complication (HCC)    fasting 90-110  . Emphysema   . Environmental and seasonal allergies   . Hyperlipidemia   . Hypertension   . Neuromuscular disorder ([place])   . Onycholysis of toenail   . Osteoporosis   . Ovarian cyst 06/01/2009   5 cm  . Restless legs   . Spigelian hernia    left ventral  . Wears glasses     Patient Active Problem List   Diagnosis Date Noted  . Acquired hammer toe of left foot 05/26/2020  . Allergic rhinitis due to pollen 05/26/2020  . Atherosclerosis 05/26/2020  . Benign neoplasm of colon 05/26/2020  . Chronic obstructive pulmonary disease, unspecified ([place]) 05/26/2020  . Constipation 05/26/2020  . Decreased estrogen level 05/26/2020  . Hardening of the aorta (main artery of the heart) ([place]) 05/26/2020  . Hypercalcemia 05/26/2020  . Hyperlipidemia 05/26/2020  . Hypertension 05/26/2020  . Hypokalemia 05/26/2020  . Osteoporosis 05/26/2020  . Personal  history of colonic polyps 05/26/2020  . Rectocele 05/26/2020  . Skin sensation disturbance 05/26/2020  . Tobacco dependence 05/26/2020  . Vaginal bleeding 05/26/2020  . Pain due to onychomycosis of toenails of both feet 07/17/2018  . Diabetes mellitus ([place]) 07/17/2018  . 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 Right lumbar radiculopathy 05/30/2013  . Diabetic neuropathy ([place]) 05/30/2013  . Cervical spondylosis without myelopathy 12/02/2012  . Lumbosacral spondylosis without myelopathy 12/02/2012  . Spondylolisthesis at L5-S1 level 12/02/2012  . AAA (abdominal aortic aneurysm) without rupture ([place]) 07/03/2012    Past Surgical History:  Procedure Laterality Date  . ANTERIOR CERVICAL DECOMP/DISCECTOMY FUSION N/A 12/18/2012   Procedure: ANTERIOR CERVICAL DECOMPRESSION/DISCECTOMY FUSION 1 LEVEL;  Surgeon: Sinclair Ship, MD;  Location: [facility];  Service: Orthopedics;  Laterality: N/A;  Anterior cervical decompression fusion, cervical 7 - thoracic 1 with instrumentation, allograft  . COLONOSCOPY    . INSERTION OF MESH Left 05/14/2014   Procedure: INSERTION OF MESH;  Surgeon: Donnie Mesa, MD;  Location: [facility];  Service: General;  Laterality: Left;  . NECK SURGERY    . SPIGELIAN HERNIA  05/14/2014  . VENTRAL HERNIA REPAIR Left 05/14/2014   Procedure: ATTEMPTED LAPAROSCOPIC REPAIR LEFT SPIGLIAN HERINA CONVERTED TO OPEN REPAIR;  Surgeon: Donnie Mesa, MD;  Location: [facility];  Service: General;  Laterality: Left;     OB History   No obstetric history on file.     Family History  Problem Relation Age of Onset  . Diabetes Mother   . Cancer Sister   . Epilepsy Brother     Social History   Tobacco Use  . Smoking status: Current Every Day Smoker    Packs/day: 1.00    Years: 52.00    Pack years: 52.00    Types: Cigarettes  . Smokeless tobacco: Never Used  . Tobacco comment: Currently on Chantix  Vaping Use  . Vaping Use: Never used  Substance Use Topics  . Alcohol use: No  . Drug use:  No    Home Medications Prior to Admission medications   Medication Sig Start Date End Date Taking? Authorizing Provider  ACCU-CHEK AVIVA PLUS test strip USE AS DIRECTED TO TEST BLOOD SUGAR DAILY 02/21/18   [provider]  albuterol (VENTOLIN HFA) 108 (90 Base) MCG/ACT inhaler SMARTSIG:1 Puff(s) By Mouth Every 4 Hours PRN 02/10/20   [provider]  Celedonio Miyamoto 62.5-25 MCG/INH AEPB  08/11/16   [provider]  Ascorbic Acid (VITAMIN C) 1000 MG tablet Take by mouth 2 (two) times daily. Patient takes one tablet (1000 mg) daily during Spring, Summer, and Fall. Patient takes one tablet (1000 mg) twice daily in the Winter.    [provider]  aspirin (ASPIRIN CHILDRENS) 81 MG chewable tablet Chew 1 tablet (81 mg total) by mouth daily. 07/03/14   Kirsteins, Luanna Salk, MD  b complex vitamins tablet Take 1 tablet by mouth daily.    [provider]  BIOTIN PO Take 500 mg by mouth daily.     [provider]  Calcium Carbonate-Vitamin D 600-400 MG-UNIT per tablet Take 1 tablet by mouth daily.     [provider]  cetirizine (ZYRTEC) 10 MG tablet Take 10 mg by mouth at bedtime.    [provider]  denosumab (PROLIA) 60 MG/ML SOSY injection Inject 60 mg into the skin every 6 (six) months.    [provider]  diclofenac Sodium (VOLTAREN) 1 % GEL Apply 4 g topically 4 (four) times daily. 06/10/20   Palumbo, April, MD  fexofenadine (ALLEGRA) 180 MG tablet 1 tablet as needed    [provider]  fluticasone (FLONASE) 50 MCG/ACT nasal spray Place 2 sprays into both nostrils daily.    [provider]  hydrochlorothiazide (MICROZIDE) 12.5 MG capsule Take 12.5 mg by mouth daily. 05/12/15   [provider]  KLOR-CON M20 20 MEQ tablet Take 20 mEq daily by mouth. 12/07/16   [provider]  lidocaine (LIDODERM) 5 % Place 1 patch onto the skin daily. Remove & Discard patch within 12 hours or as directed by MD  06/10/20   Randal Buba, April, MD  lisinopril (PRINIVIL,ZESTRIL) 5 MG tablet Take 5 mg by mouth at bedtime.     [provider]  Multiple Vitamins-Minerals (MULTIVITAMIN WITH MINERALS) tablet Take 1 tablet by mouth every evening.     [provider]  Omega-3 Fatty Acids (FISH OIL) 1000 MG CAPS Take by mouth. Fish oil 1000mg /300mg  Omega 3    [provider]  polyethylene glycol-electrolytes (NULYTELY) 420 g solution See admin instructions. 02/03/20   [provider]  pravastatin (PRAVACHOL) 20 MG tablet Take 20 mg by mouth at bedtime.     [provider]  [facility] injection  08/08/16   [provider]  tiZANidine (ZANAFLEX) 2 MG tablet Take 1 tablet (2 mg total) by mouth 2 (two) times daily. 06/11/20   Kirsteins, Luanna Salk, MD  traMADol (ULTRAM) 50 MG tablet  Take 1 tablet (50 mg total) by mouth every 8 (eight) hours as needed. no more than 3 per day 06/11/20   Erick ColaceKirsteins, Andrew E, MD    Allergies    Other  Review of Systems   Review of Systems  Constitutional: Negative for chills and fever.  HENT: Negative for congestion.   Eyes: Negative for visual disturbance.  Respiratory: Negative for shortness of breath.   Cardiovascular: Negative for chest pain.  Gastrointestinal: Negative for abdominal pain and vomiting.  Genitourinary: Negative for dysuria and flank pain.  Musculoskeletal: Negative for back pain, neck pain and neck stiffness.  Skin: Negative for rash.  Neurological: Positive for weakness. Negative for light-headedness and headaches.  Psychiatric/Behavioral: Positive for confusion.    Physical Exam Updated Vital Signs BP 103/72 (BP Location: Right Arm)   Pulse 88   Temp 98 F (36.7 C) (Oral)   Resp 15   Ht 4\' 9"  (1.448 m)   Wt 48.5 kg   SpO2 95%   BMI 23.15 kg/m   Physical Exam Vitals and nursing note reviewed.  Constitutional:      Appearance: She is well-developed.  HENT:     Head: Normocephalic and atraumatic.  Eyes:      General:        Right eye: No discharge.        Left eye: No discharge.     Conjunctiva/sclera: Conjunctivae normal.  Neck:     Trachea: No tracheal deviation.  Cardiovascular:     Rate and Rhythm: Normal rate and regular rhythm.  Pulmonary:     Effort: Pulmonary effort is normal.     Breath sounds: Normal breath sounds.  Abdominal:     General: There is no distension.     Palpations: Abdomen is soft.     Tenderness: There is no abdominal tenderness. There is no guarding.  Musculoskeletal:        General: Normal range of motion.     Cervical back: Normal range of motion and neck supple.  Skin:    General: Skin is warm.     Findings: No rash.  Neurological:     Mental Status: She is alert and oriented to person, place, and time.  Psychiatric:        Mood and Affect: Affect is blunt. Affect is not tearful.        Speech: Speech is not rapid and pressured.        Cognition and Memory: She exhibits impaired recent memory.     ED Results / Procedures / Treatments   Labs (all labs ordered are listed, but only abnormal results are displayed) Labs Reviewed  CBC WITH DIFFERENTIAL/PLATELET - Abnormal; Notable for the following components:      Result Value   Hemoglobin 16.2 (*)    HCT 47.4 (*)    All other components within normal limits  URINALYSIS, ROUTINE W REFLEX MICROSCOPIC - Abnormal; Notable for the following components:   Leukocytes,Ua LARGE (*)    Bacteria, UA FEW (*)    All other components within normal limits  URINE CULTURE  COMPREHENSIVE METABOLIC PANEL  RAPID URINE DRUG SCREEN, HOSP PERFORMED  TSH  ETHANOL    EKG None  Radiology MR BRAIN WO CONTRAST  Result Date: 06/18/2020 CLINICAL DATA:  Memory loss; confusion, memory loss. EXAM: MRI HEAD WITHOUT CONTRAST TECHNIQUE: Multiplanar, multiecho pulse sequences of the brain and surrounding structures were obtained without intravenous contrast. COMPARISON:  Prior head CT 06/10/2020. FINDINGS: Brain:  Intermittently motion degraded  examination. Most notably, there is moderate/severe motion degradation of the coronal T2 weighted sequence. Moderate cerebral atrophy.  Comparatively mild cerebellar atrophy. Advanced patchy and confluent T2/FLAIR hyperintensity within the cerebral white matter, nonspecific but compatible with chronic small vessel ischemic disease. Foci of T2 hyperintensity within the basal ganglia bilaterally, likely reflecting a combination of prominent perivascular spaces and chronic lacunar infarcts. Chronic lacunar infarct within the left thalamus. There is no acute infarct. No evidence of intracranial mass. No chronic intracranial blood products. No extra-axial fluid collection. No midline shift. Partially empty sella turcica. Vascular: Expected proximal arterial flow voids. Non dominant intracranial left vertebral artery. Skull and upper cervical spine: No focal marrow lesion. Sinuses/Orbits: Visualized orbits show no acute finding. Mild bilateral ethmoid sinus mucosal thickening. IMPRESSION: Moderate/severe motion degradation of the coronal T2 sequence. No more than mild motion degradation of the remaining acquired sequences. No evidence of acute intracranial abnormality. Severe chronic small vessel ischemic disease within the cerebral white matter. Chronic lacunar infarcts within the bilateral basal ganglia and left thalamus. Moderate generalized cerebral atrophy. Comparatively mild cerebellar atrophy. Electronically Signed   By: Kellie Simmering DO   On: 06/18/2020 11:38   DG Chest Portable 1 View  Result Date: 06/18/2020 CLINICAL DATA:  75 year old female with altered mental status. COPD. EXAM: PORTABLE CHEST 1 VIEW COMPARISON:  Chest CT 01/28/2019 and earlier. FINDINGS: Portable AP semi upright view at 0848 hours. Emphysema on the comparison CT. Large lung volumes appear stable. Normal cardiac size and mediastinal contours. Visualized tracheal air column is within normal limits. No  pneumothorax, pulmonary edema, pleural effusion or confluent pulmonary opacity. Chronic cervical ACDF. No acute osseous abnormality identified. Paucity of bowel gas in the upper abdomen. IMPRESSION: Emphysema (ICD10-J43.9). No acute cardiopulmonary abnormality. Electronically Signed   By: Genevie Ann M.D.   On: 06/18/2020 09:03    Procedures Procedures   Medications Ordered in ED Medications - No data to display  ED Course  I have reviewed the triage vital signs and the nursing notes.  Pertinent labs & imaging results that were available during my care of the patient were reviewed by me and considered in my medical decision making (see chart for details).    MDM Rules/Calculators/A&P                          Patient presents for repeat visit for memory loss.  Reviewed medical records patient had recent CT scan of the head 1 week ago with unremarkable results.  Majority of history I am able to obtain from patient otherwise 67s medical record as at times patient says she does not remember and other times she has accurate description of medical history based on the record.  Patient was not cooperating with entire physical exam for example initially said she could not move her eyes to the right or left however with distraction and normal conversation she could move her eyes horizontally without difficulty.  Furthermore asking her to lift each leg she says she cannot but distraction she will move both legs.  Broad differential diagnosis including dementia various types, depression, medication induced, urine infection, psychiatric, metabolic, thyroid related, other.  Plan for blood work, MRI, urinalysis for further evaluation.  Case management/social worker consulted. Urinalysis reviewed mild leukocytes, no sign of significant infection, no urinary symptoms no fever we will send culture and follow-up outpatient.  MRI reviewed no acute stroke, old strokes and atrophy which is likely contributing to dementia  like presentation.  General blood work reviewed normal sodium normal electrolytes normal kidney function no significant anemia.  Patient interactive answering questions appropriately on reassessment, family arrived.  Social Financial controller assisted to make sure close outpatient follow-up/resources to call for assisted living placement.  Oral fluids given. Final Clinical Impression(s) / ED Diagnoses Final diagnoses:  Memory loss    Rx / DC Orders ED Discharge Orders    None       Elnora Morrison, MD 06/18/20 1600

## 2020-06-18 NOTE — ED Notes (Signed)
Patient transported to MRI 

## 2020-06-18 NOTE — Discharge Planning (Signed)
RNCM met with pt and niece at bedside regarding disposition plan.  RNCM educated niece and pt regarding memory care placement.  RNCM provided Assisted Living resources and Memory Care resources so they can determine which location will best fit the pt's needs.  Niece verbalized understanding of instructions.

## 2020-06-18 NOTE — ED Notes (Signed)
Pt ambulated to RR and back to room. Pt did well

## 2020-06-18 NOTE — Discharge Instructions (Signed)
Assisted Living Facilities  [address] [place], [place], [place] [address]  [phone]  [facility] [address], [place], [place] [address]  [phone]  [facility] [address] [place], [place] [address]  [phone]  Elder and Belinda Fisher Email [place] anytime at [email or web address] or call [phone].   [facility] [address] [place], [place] [address]  [phone]

## 2020-06-18 NOTE — ED Notes (Signed)
Pt ambulated to restroom with steady gait. One stand by assist for safety.

## 2020-06-19 LAB — URINE CULTURE

## 2020-06-22 ENCOUNTER — Telehealth: Payer: Self-pay | Admitting: *Deleted

## 2020-06-22 ENCOUNTER — Emergency Department (HOSPITAL_COMMUNITY): Payer: Medicare Other

## 2020-06-22 ENCOUNTER — Encounter (HOSPITAL_COMMUNITY): Payer: Self-pay | Admitting: Student

## 2020-06-22 ENCOUNTER — Emergency Department (HOSPITAL_COMMUNITY)
Admission: EM | Admit: 2020-06-22 | Discharge: 2020-06-26 | Disposition: A | Discharge status: Home / Self Care | Payer: Medicare Other | Attending: Emergency Medicine | Admitting: Emergency Medicine

## 2020-06-22 ENCOUNTER — Other Ambulatory Visit: Payer: Self-pay

## 2020-06-22 DIAGNOSIS — F17210 Nicotine dependence, cigarettes, uncomplicated: Secondary | ICD-10-CM | POA: Diagnosis not present

## 2020-06-22 DIAGNOSIS — R262 Difficulty in walking, not elsewhere classified: Secondary | ICD-10-CM | POA: Diagnosis not present

## 2020-06-22 DIAGNOSIS — F0391 Unspecified dementia with behavioral disturbance: Secondary | ICD-10-CM

## 2020-06-22 DIAGNOSIS — K5900 Constipation, unspecified: Secondary | ICD-10-CM | POA: Diagnosis not present

## 2020-06-22 DIAGNOSIS — U071 COVID-19: Secondary | ICD-10-CM | POA: Insufficient documentation

## 2020-06-22 DIAGNOSIS — Z7982 Long term (current) use of aspirin: Secondary | ICD-10-CM | POA: Diagnosis not present

## 2020-06-22 DIAGNOSIS — E119 Type 2 diabetes mellitus without complications: Secondary | ICD-10-CM | POA: Diagnosis not present

## 2020-06-22 DIAGNOSIS — Z743 Need for continuous supervision: Secondary | ICD-10-CM | POA: Diagnosis not present

## 2020-06-22 DIAGNOSIS — K8020 Calculus of gallbladder without cholecystitis without obstruction: Secondary | ICD-10-CM | POA: Diagnosis not present

## 2020-06-22 DIAGNOSIS — Z79899 Other long term (current) drug therapy: Secondary | ICD-10-CM | POA: Insufficient documentation

## 2020-06-22 DIAGNOSIS — K8080 Other cholelithiasis without obstruction: Secondary | ICD-10-CM | POA: Diagnosis not present

## 2020-06-22 DIAGNOSIS — I714 Abdominal aortic aneurysm, without rupture: Secondary | ICD-10-CM | POA: Insufficient documentation

## 2020-06-22 DIAGNOSIS — R079 Chest pain, unspecified: Secondary | ICD-10-CM | POA: Diagnosis not present

## 2020-06-22 DIAGNOSIS — F0390 Unspecified dementia without behavioral disturbance: Secondary | ICD-10-CM | POA: Diagnosis not present

## 2020-06-22 DIAGNOSIS — I10 Essential (primary) hypertension: Secondary | ICD-10-CM | POA: Diagnosis not present

## 2020-06-22 DIAGNOSIS — K449 Diaphragmatic hernia without obstruction or gangrene: Secondary | ICD-10-CM | POA: Diagnosis not present

## 2020-06-22 DIAGNOSIS — H579 Unspecified disorder of eye and adnexa: Secondary | ICD-10-CM | POA: Diagnosis not present

## 2020-06-22 DIAGNOSIS — R404 Transient alteration of awareness: Secondary | ICD-10-CM | POA: Diagnosis not present

## 2020-06-22 LAB — RESP PANEL BY RT-PCR (FLU A&B, COVID) ARPGX2
Influenza A by PCR: NEGATIVE
Influenza B by PCR: NEGATIVE
SARS Coronavirus 2 by RT PCR: POSITIVE — AB

## 2020-06-22 LAB — CBC
HCT: 44.3 % (ref 36.0–46.0)
Hemoglobin: 15.2 g/dL — ABNORMAL HIGH (ref 12.0–15.0)
MCH: 33.2 pg (ref 26.0–34.0)
MCHC: 34.3 g/dL (ref 30.0–36.0)
MCV: 96.7 fL (ref 80.0–100.0)
Platelets: 174 10*3/uL (ref 150–400)
RBC: 4.58 MIL/uL (ref 3.87–5.11)
RDW: 13 % (ref 11.5–15.5)
WBC: 7 10*3/uL (ref 4.0–10.5)
nRBC: 0 % (ref 0.0–0.2)

## 2020-06-22 LAB — BASIC METABOLIC PANEL
Anion gap: 7 (ref 5–15)
BUN: 16 mg/dL (ref 8–23)
CO2: 29 mmol/L (ref 22–32)
Calcium: 10.2 mg/dL (ref 8.9–10.3)
Chloride: 103 mmol/L (ref 98–111)
Creatinine, Ser: 0.71 mg/dL (ref 0.44–1.00)
GFR, Estimated: >60 mL/min (ref >60)
Glucose, Bld: 128 mg/dL — ABNORMAL HIGH (ref 70–99)
Potassium: 3.9 mmol/L (ref 3.5–5.1)
Sodium: 139 mmol/L (ref 135–145)

## 2020-06-22 MED ORDER — SENNOSIDES-DOCUSATE SODIUM 8.6-50 MG PO TABS
2.0000 | ORAL_TABLET | Freq: Two times a day (BID) | ORAL | Status: DC
  Administered 2020-06-22 – 2020-06-23 (×2): 2 via ORAL
  Filled 2020-06-22 (×3): qty 2

## 2020-06-22 MED ORDER — ALBUTEROL SULFATE HFA 108 (90 BASE) MCG/ACT IN AERS
1.0000 | INHALATION_SPRAY | RESPIRATORY_TRACT | Status: DC | PRN
  Administered 2020-06-25: 2 via RESPIRATORY_TRACT
  Filled 2020-06-22: qty 6.7

## 2020-06-22 MED ORDER — POTASSIUM CHLORIDE CRYS ER 20 MEQ PO TBCR
20.0000 meq | EXTENDED_RELEASE_TABLET | Freq: Every day | ORAL | Status: DC
  Administered 2020-06-22 – 2020-06-25 (×4): 20 meq via ORAL
  Filled 2020-06-22 (×4): qty 1

## 2020-06-22 MED ORDER — LORATADINE 10 MG PO TABS
10.0000 mg | ORAL_TABLET | Freq: Every day | ORAL | Status: DC
  Administered 2020-06-22 – 2020-06-25 (×4): 10 mg via ORAL
  Filled 2020-06-22 (×4): qty 1

## 2020-06-22 MED ORDER — TRAMADOL HCL 50 MG PO TABS
50.0000 mg | ORAL_TABLET | Freq: Once | ORAL | Status: AC
  Administered 2020-06-22: 50 mg via ORAL
  Filled 2020-06-22: qty 1

## 2020-06-22 MED ORDER — NICOTINE 21 MG/24HR TD PT24
21.0000 mg | MEDICATED_PATCH | Freq: Once | TRANSDERMAL | Status: AC
  Administered 2020-06-22: 21 mg via TRANSDERMAL
  Filled 2020-06-22: qty 1

## 2020-06-22 MED ORDER — ASPIRIN 81 MG PO CHEW
81.0000 mg | CHEWABLE_TABLET | Freq: Every day | ORAL | Status: DC
  Administered 2020-06-22 – 2020-06-25 (×4): 81 mg via ORAL
  Filled 2020-06-22 (×4): qty 1

## 2020-06-22 MED ORDER — TRAMADOL HCL 50 MG PO TABS
50.0000 mg | ORAL_TABLET | Freq: Three times a day (TID) | ORAL | Status: DC | PRN
  Administered 2020-06-22 – 2020-06-26 (×8): 50 mg via ORAL
  Filled 2020-06-22 (×9): qty 1

## 2020-06-22 NOTE — Progress Notes (Addendum)
.   Transition of Care [facility]) - Emergency Department Mini Assessment   Patient Details  Name: Kylie Hurst MRN: [id number] Date of Birth: [birth year]  Transition of Care [facility]) CM/SW Contact:    Erenest Rasher, RN Phone Number: [phone] 06/22/2020, 3:58 PM   Clinical Narrative: TOC CM spoke to pt and states she lives alone. States she is weaker and is positive for COVID. She is agreeable to SNF rehab. Gave permission to create FL2 and fax referral to SNF. Waiting PT evaluation. TOC CM contacted PT for evaluation. Gave permission to speak to niece, Tammy. TOC CM contacted niece. States she contacted [facility] for [employer]. Pt has Medicaid. And explained she will need to contact DSS to have Medicaid switched to [place]. Pt reports she has a RW at home but does not know how to use. She is agreeable to Memory Care once her rehab is complete. Pt is currently not vaccinated.    TOC CM contacted niece, Lynelle Smoke and she spoke to [facility]. Explained FL2 is ready for Memory Care.    ED Mini Assessment: What brought you to the Emergency Department? : weakness  Barriers to Discharge: Continued Medical Work up  [employer] interventions: positive COVID, SNF rehab     Interventions which prevented an admission or readmission: SNF Placement    Patient Contact and Communications Key Contact 1: Tammy Jessup with: niece Contact Date: 06/22/20,   Contact time: 1500 Contact Phone Number: [phone] Call outcome: SNF rehab and she will work on [place] Medicare.gov Compare Post Acute Care list provided to:: Patient Choice offered to / list presented to : Patient  Admission diagnosis:  behavioral issues Patient Active Problem List   Diagnosis Date Noted  . Acquired hammer toe of left foot 05/26/2020  . Allergic rhinitis due to pollen 05/26/2020  . Atherosclerosis 05/26/2020  . Benign neoplasm of colon 05/26/2020  . Chronic obstructive pulmonary  disease, unspecified ([place]) 05/26/2020  . Constipation 05/26/2020  . Decreased estrogen level 05/26/2020  . Hardening of the aorta (main artery of the heart) ([place]) 05/26/2020  . Hypercalcemia 05/26/2020  . Hyperlipidemia 05/26/2020  . Hypertension 05/26/2020  . Hypokalemia 05/26/2020  . Osteoporosis 05/26/2020  . Personal history of colonic polyps 05/26/2020  . Rectocele 05/26/2020  . Skin sensation disturbance 05/26/2020  . Tobacco dependence 05/26/2020  . Vaginal bleeding 05/26/2020  . Pain due to onychomycosis of toenails of both feet 07/17/2018  . Diabetes mellitus ([place]) 07/17/2018  . 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 Right lumbar radiculopathy 05/30/2013  . Diabetic neuropathy ([place]) 05/30/2013  . Cervical spondylosis without myelopathy 12/02/2012  . Lumbosacral spondylosis without myelopathy 12/02/2012  . Spondylolisthesis at L5-S1 level 12/02/2012  . AAA (abdominal aortic aneurysm) without rupture ([place]) 07/03/2012   PCP:  Carol Ada, MD Pharmacy:   [facility], [place] [address] Phone: [phone] Fax: [phone]

## 2020-06-22 NOTE — ED Notes (Signed)
Pt placed on the purewic

## 2020-06-22 NOTE — ED Triage Notes (Signed)
Patient BIB [facility] from home alone with behavioral issues.  Patient recently diagnosed with dementia.  Patient called EMS for lack of bowel movement but refused to tell EMS when her last BM was.  She just keeps saying she has dementia and threatening to hit EMS.  Niece who was on the phone said this has been going on for 3 weeks. Patient displaying flight of ideas.    Vitals were  130/70 72-HR 20-RR  97% room air 97.7-temp 109-CBG

## 2020-06-22 NOTE — ED Notes (Signed)
Patient transported to CT 

## 2020-06-22 NOTE — ED Provider Notes (Addendum)
[facility] DEPT Provider Note   CSN: [id number] Arrival date & time: 06/22/20  4627     History Chief Complaint  Patient presents with  . Dementia    Kylie Hurst is a 75 y.o. female.  HPI Level 5 caveat due to dementia. Patient brought in reportedly for constipation.  States she has not had a bowel movement in 5 days.  States her abdomen is a little more swollen.  Also is states she has dementia.  Initially unsure why she was here.  Reportedly has been more confused at home.  Lives by herself.  No fevers.  Pain is dull.  Similar pain she is had previously.  Has history of AAA.  Seen in the ER 4 days ago.    Past Medical History:  Diagnosis Date  . Aortic aneurysm ([place]) 2011   3.9mm  . Arthritis   . Back spasm   . Bulging discs   . Chronic back pain   . Dementia ([place])   . Depression   . Diabetes mellitus without complication (HCC)    fasting 90-110  . Emphysema   . Environmental and seasonal allergies   . Hyperlipidemia   . Hypertension   . Neuromuscular disorder ([place])   . Onycholysis of toenail   . Osteoporosis   . Ovarian cyst 06/01/2009   5 cm  . Restless legs   . Spigelian hernia    left ventral  . Wears glasses     Patient Active Problem List   Diagnosis Date Noted  . Acquired hammer toe of left foot 05/26/2020  . Allergic rhinitis due to pollen 05/26/2020  . Atherosclerosis 05/26/2020  . Benign neoplasm of colon 05/26/2020  . Chronic obstructive pulmonary disease, unspecified ([place]) 05/26/2020  . Constipation 05/26/2020  . Decreased estrogen level 05/26/2020  . Hardening of the aorta (main artery of the heart) ([place]) 05/26/2020  . Hypercalcemia 05/26/2020  . Hyperlipidemia 05/26/2020  . Hypertension 05/26/2020  . Hypokalemia 05/26/2020  . Osteoporosis 05/26/2020  . Personal history of colonic polyps 05/26/2020  . Rectocele 05/26/2020  . Skin sensation disturbance 05/26/2020  . Tobacco dependence 05/26/2020  .  Vaginal bleeding 05/26/2020  . Pain due to onychomycosis of toenails of both feet 07/17/2018  . Diabetes mellitus ([place]) 07/17/2018  . 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 Right lumbar radiculopathy 05/30/2013  . Diabetic neuropathy ([place]) 05/30/2013  . Cervical spondylosis without myelopathy 12/02/2012  . Lumbosacral spondylosis without myelopathy 12/02/2012  . Spondylolisthesis at L5-S1 level 12/02/2012  . AAA (abdominal aortic aneurysm) without rupture ([place]) 07/03/2012    Past Surgical History:  Procedure Laterality Date  . ANTERIOR CERVICAL DECOMP/DISCECTOMY FUSION N/A 12/18/2012   Procedure: ANTERIOR CERVICAL DECOMPRESSION/DISCECTOMY FUSION 1 LEVEL;  Surgeon: Sinclair Ship, MD;  Location: [facility];  Service: Orthopedics;  Laterality: N/A;  Anterior cervical decompression fusion, cervical 7 - thoracic 1 with instrumentation, allograft  . COLONOSCOPY    . INSERTION OF MESH Left 05/14/2014   Procedure: INSERTION OF MESH;  Surgeon: Donnie Mesa, MD;  Location: [facility];  Service: General;  Laterality: Left;  . NECK SURGERY    . SPIGELIAN HERNIA  05/14/2014  . VENTRAL HERNIA REPAIR Left 05/14/2014   Procedure: ATTEMPTED LAPAROSCOPIC REPAIR LEFT SPIGLIAN HERINA CONVERTED TO OPEN REPAIR;  Surgeon: Donnie Mesa, MD;  Location: [facility];  Service: General;  Laterality: Left;     OB History   No obstetric history on file.     Family History  Problem Relation Age of  Onset  . Diabetes Mother   . Cancer Sister   . Epilepsy Brother     Social History   Tobacco Use  . Smoking status: Current Every Day Smoker    Packs/day: 1.00    Years: 52.00    Pack years: 52.00    Types: Cigarettes  . Smokeless tobacco: Never Used  . Tobacco comment: Currently on Chantix  Vaping Use  . Vaping Use: Never used  Substance Use Topics  . Alcohol use: No  . Drug use: No    Home Medications Prior to Admission medications   Medication Sig Start Date End Date Taking? Authorizing Provider  albuterol  (VENTOLIN HFA) 108 (90 Base) MCG/ACT inhaler Inhale 1 puff into the lungs every 4 (four) hours as needed for shortness of breath or wheezing. 02/10/20  Yes [provider]  ANORO ELLIPTA 62.5-25 MCG/INH AEPB Inhale 1 puff into the lungs in the morning. 08/11/16  Yes [provider]  Ascorbic Acid (VITAMIN C) 1000 MG tablet Take 1,000 mg by mouth See admin instructions. Take 1,000 mg by mouth once a day in the Spring, Summer, and Fall- increase to 1,000 mg two times a day in the Winter   Yes [provider]  aspirin (ASPIRIN CHILDRENS) 81 MG chewable tablet Chew 1 tablet (81 mg total) by mouth daily. 07/03/14  Yes Kirsteins, Luanna Salk, MD  b complex vitamins tablet Take 1 tablet by mouth daily.   Yes [provider]  BIOTIN PO Take 1,000 mg by mouth 2 (two) times daily.   Yes [provider]  Calcium Carbonate-Vitamin D 600-400 MG-UNIT per tablet Take 1 tablet by mouth daily.    Yes [provider]  cetirizine (ZYRTEC) 10 MG tablet Take 10 mg by mouth at bedtime.   Yes [provider]  denosumab (PROLIA) 60 MG/ML SOSY injection Inject 60 mg into the skin every 6 (six) months.   Yes [provider]  diclofenac Sodium (VOLTAREN) 1 % GEL Apply 4 g topically 4 (four) times daily. 06/10/20  Yes Palumbo, April, MD  fexofenadine (ALLEGRA) 180 MG tablet Take 180 mg by mouth daily as needed for rhinitis or allergies.   Yes [provider]  fluticasone (FLONASE) 50 MCG/ACT nasal spray Place 1-2 sprays into both nostrils daily.   Yes [provider]  guaiFENesin (MUCINEX) 600 MG 12 hr tablet Take 600 mg by mouth 2 (two) times daily.   Yes [provider]  hydrochlorothiazide (MICROZIDE) 12.5 MG capsule Take 12.5 mg by mouth every evening. 05/12/15  Yes [provider]  KLOR-CON M20 20 MEQ tablet Take 20 mEq by mouth every evening. 12/07/16  Yes [provider]  lisinopril (PRINIVIL,ZESTRIL) 5 MG tablet  Take 5 mg by mouth at bedtime.    Yes [provider]  Multiple Vitamins-Minerals (MULTIVITAMIN WITH MINERALS) tablet Take 1 tablet by mouth every evening.    Yes [provider]  Omega-3 Fatty Acids (FISH OIL) 1000 MG CAPS Take 1 capsule by mouth daily.   Yes [provider]  pravastatin (PRAVACHOL) 20 MG tablet Take 20 mg by mouth at bedtime.    Yes [provider]  tiZANidine (ZANAFLEX) 2 MG tablet Take 1 tablet (2 mg total) by mouth 2 (two) times daily. 06/11/20  Yes Kirsteins, Luanna Salk, MD  traMADol (ULTRAM) 50 MG tablet Take 1 tablet (50 mg total) by mouth every 8 (eight) hours as needed. no more than 3 per day Patient taking differently: Take 50 mg  by mouth 3 (three) times daily. 06/11/20  Yes Kirsteins, Luanna Salk, MD  ACCU-CHEK AVIVA PLUS test strip USE AS DIRECTED TO TEST BLOOD SUGAR DAILY 02/21/18   [provider]  lidocaine (LIDODERM) 5 % Place 1 patch onto the skin daily. Remove & Discard patch within 12 hours or as directed by MD 06/10/20   Randal Buba, April, MD  polyethylene glycol-electrolytes (NULYTELY) 420 g solution See admin instructions. Patient not taking: No sig reported 02/03/20   [provider]  [facility] injection  08/08/16   [provider]    Allergies    Other  Review of Systems   Review of Systems  Unable to perform ROS: Dementia    Physical Exam Updated Vital Signs BP 121/70 (BP Location: Right Arm)   Pulse 74   Temp 98.4 F (36.9 C) (Oral)   Resp 17   Ht 4\' 9"  (1.448 m)   Wt 108 kg   SpO2 92%   BMI 51.52 kg/m   Physical Exam Vitals and nursing note reviewed.  HENT:     Head: Normocephalic.  Eyes:     General: No scleral icterus. Cardiovascular:     Rate and Rhythm: Regular rhythm.  Pulmonary:     Effort: Pulmonary effort is normal.  Abdominal:     Tenderness: There is no abdominal tenderness.  Musculoskeletal:        General: No tenderness.  Skin:    General: Skin is warm.   Neurological:     Mental Status: She is alert.     Comments: somewhat pressured speech.  Also some confusion.       ED Results / Procedures / Treatments   Labs (all labs ordered are listed, but only abnormal results are displayed) Labs Reviewed  RESP PANEL BY RT-PCR (FLU A&B, COVID) ARPGX2 - Abnormal; Notable for the following components:      Result Value   SARS Coronavirus 2 by RT PCR POSITIVE (*)    All other components within normal limits  CBC - Abnormal; Notable for the following components:   Hemoglobin 15.2 (*)    All other components within normal limits  BASIC METABOLIC PANEL - Abnormal; Notable for the following components:   Glucose, Bld 128 (*)    All other components within normal limits    EKG None  Radiology CT ABDOMEN PELVIS WO CONTRAST  Result Date: 06/22/2020 CLINICAL DATA:  AAA, surveillance EXAM: CT ABDOMEN AND PELVIS WITHOUT CONTRAST TECHNIQUE: Multidetector CT imaging of the abdomen and pelvis was performed following the standard protocol without IV contrast. COMPARISON:  Same day ultrasound and CT pelvis April 30, 2012. FINDINGS: Lower chest: Bibasilar atelectasis versus scarring Hepatobiliary: Small calcified hepatic granulomata. Otherwise unremarkable noncontrast appearance of the hepatic parenchyma. Cholelithiasis measuring 1.4 cm without evidence of acute cholecystitis. No biliary ductal dilation. Pancreas: Within normal limits. Spleen: Splenic granulomata. Adrenals/Urinary Tract: Adrenal glands are unremarkable. Kidneys are normal, without renal calculi, contour deforming lesion, or hydronephrosis. Bladder is unremarkable. Stomach/Bowel: Small hiatal hernia otherwise the stomach is grossly unremarkable. No pathologic dilation of small bowel. Small volume of formed stool throughout the colon. The appendix is not definitely visualized however there is no pericecal inflammation. Vascular/Lymphatic: Aortic atherosclerosis. Infrarenal abdominal aortic aneurysm  measuring 4.5 cm. No pathologically enlarged abdominal or pelvic lymph nodes. Reproductive: Status post hysterectomy. Left adnexa is unremarkable. 1.6 cm right ovarian cyst. No follow-up imaging recommended. Note: This recommendation does not apply to premenarchal patients and to those with increased risk (genetic, family history,  elevated tumor markers or other high-risk factors) of ovarian cancer. Reference: JACR 2020 Feb; 17(2):248-254 Other: No abdominopelvic ascites. Musculoskeletal: Chronic bilateral L5 pars defects with grade 1 L5 on S1 anterolisthesis. Multilevel degenerative changes spine. No acute osseous abnormality. IMPRESSION: 1. No acute abnormality in the abdomen pelvis. Small volume of formed stool throughout the colon. 2. Unchanged size of the infrarenal abdominal aortic aneurysm measuring 4.7 cm, comparison to most recent surveillance ultrasound. Recommend follow-up ultrasound every 2 years. This recommendation follows ACR consensus guidelines: White Paper of the ACR Incidental Findings Committee II on Vascular Findings. J Am Coll Radiol 2013; 10:789-794. 3. Cholelithiasis without evidence of acute cholecystitis. 4. Aortic atherosclerosis. Aortic Atherosclerosis (ICD10-I70.0). Electronically Signed   By: Dahlia Bailiff MD   On: 06/22/2020 13:23   US Aorta  Result Date: 06/22/2020 CLINICAL DATA:  Back pain and known abdominal aortic aneurysm. EXAM: ULTRASOUND OF ABDOMINAL AORTA TECHNIQUE: Ultrasound examination of the abdominal aorta and proximal common iliac arteries was performed to evaluate for aneurysm. Additional color and Doppler images of the distal aorta were obtained to document patency. COMPARISON:  Report from a prior duplex ultrasound study of the abdominal aorta on 05/26/2020 FINDINGS: Abdominal aortic measurements as follows: Proximal:  1.8 x 1.8 cm Mid:  1.6 x 3.0 cm Distal:  4.2 x 4.7 cm Patent: Yes, peak systolic velocity is 46 cm/sec There likely is some mural thrombus at the  level of the aortic aneurysm. The aortic bifurcation and common iliac arteries are not visualized due to shadowing from bowel gas and other adjacent structures. No large retroperitoneal hemorrhage identified. IMPRESSION: Similar size of abdominal aortic aneurysm compared to the most recent duplex ultrasound study with maximal diameter of approximately 4.7 cm. There is likely mural thrombus in the aneurysm sac. Aortic bifurcation and iliac arteries are not visualized. This study cannot exclude leaking or ruptured aneurysm but a large retroperitoneal bleed is not visualized by ultrasound. Electronically Signed   By: Aletta Edouard M.D.   On: 06/22/2020 10:41   DG Abdomen Acute W/Chest  Result Date: 06/22/2020 CLINICAL DATA:  Constipation EXAM: DG ABDOMEN ACUTE WITH 1 VIEW CHEST COMPARISON:  03/26/2009 FINDINGS: Cardiac shadow is within normal limits. Aortic calcifications are noted. The lungs are well aerated bilaterally. No focal infiltrate or effusion is seen. Postsurgical changes in the cervical spine are noted. Scattered large and small bowel gas is noted. No free air is seen. Retained fecal material is noted within the colon consistent with a mild degree of constipation. No obstructive changes are seen. Degenerative changes of the lumbar spine are noted. Atherosclerotic calcifications of the abdominal aorta are noted suggesting and abdominal aneurysm. True measurement is difficult although a an aneurysm was seen on a prior CT from 2011. No other focal abnormality is noted. IMPRESSION: No acute abnormality in the chest. Changes of mild constipation. Findings consistent within abdominal aortic aneurysm although incompletely evaluated on this exam. Ultrasound may be helpful to assess size and follow-up options. Electronically Signed   By: Inez Catalina M.D.   On: 06/22/2020 09:41    Procedures Procedures   Medications Ordered in ED Medications  aspirin chewable tablet 81 mg (81 mg Oral Given 06/22/20  1702)  albuterol (VENTOLIN HFA) 108 (90 Base) MCG/ACT inhaler 1-2 puff (has no administration in time range)  loratadine (CLARITIN) tablet 10 mg (10 mg Oral Given 06/22/20 1702)  potassium chloride SA (KLOR-CON) CR tablet 20 mEq (20 mEq Oral Given 06/22/20 1703)  traMADol (ULTRAM) tablet 50 mg (has no  administration in time range)  senna-docusate (Senokot-S) tablet 2 tablet (2 tablets Oral Given 06/22/20 1710)  traMADol (ULTRAM) tablet 50 mg (50 mg Oral Given 06/22/20 1437)    ED Course  I have reviewed the triage vital signs and the nursing notes.  Pertinent labs & imaging results that were available during my care of the patient were reviewed by me and considered in my medical decision making (see chart for details).    MDM Rules/Calculators/A&P                          Patient presents with abdominal pain.  Some acute on chronic pain.  Has history of AAA.  AAA evaluated and appears stable.  I do not think this is causing pain.  However patient has been reportedly more confused at home.  History of dementia.  Discussed with the patient's niece, Margaretmary Lombard.  States does not feel as if the patient is all that safe at home.  Has been threatening EMS.  Patient lives by herself and patient cannot move in with the niece since all the beds are upstairs.  They have been looking at getting her to an assisted living/memory care.  However are not unable to get it done.  Patient reportedly canceled doctors visit that they had scheduled yesterday.  Patient does have positive COVID test.  Asymptomatic.  Patient is medically cleared.  Will have patient seen by TTS to help evaluate for placement.  Patient does have positive COVID test.  Asymptomatic.  Cycle number of 43.6.  I am contacting ID to see what this means for infectiousness. Final Clinical Impression(s) / ED Diagnoses Final diagnoses:  Dementia with behavioral disturbance, unspecified dementia type [facility])    Rx / DC Orders ED Discharge Orders     None       Davonna Belling, MD 06/22/20 1411    Davonna Belling, MD 06/22/20 1704    Davonna Belling, MD 06/22/20 1728

## 2020-06-22 NOTE — ED Notes (Signed)
Patient would like only her niece contacted

## 2020-06-22 NOTE — ED Notes (Signed)
Dr. Roderic Palau notified of pt request for sleeping medication for night time and for the need for a nicotine patch

## 2020-06-22 NOTE — Telephone Encounter (Signed)
Oral swab drug screen was consistent for prescribed medications.  ?

## 2020-06-22 NOTE — ED Notes (Signed)
US at bedside

## 2020-06-22 NOTE — ED Notes (Signed)
Patient had a very large soft BM.

## 2020-06-22 NOTE — ED Notes (Signed)
Snacks and fluids provided to pt

## 2020-06-22 NOTE — NC FL2 (Signed)
[place] LEVEL OF CARE SCREENING TOOL     IDENTIFICATION  Patient Name: Kylie Hurst Birthdate: 1945-08-26 Sex: female Admission Date (Current Location): 06/22/2020  [facility] and [place] Number:  Herbalist and Address:  [facility],  [place] [address] [place]      Provider Number: [id number]  Attending Physician Name and Address:  Davonna Belling, MD  Relative Name and Phone Number:  Melrose Nakayama # [phone]    Current Level of Care: Hospital Recommended Level of Care: Memory Care Prior Approval Number:    Date Approved/Denied:   PASRR Number: [id number]  Discharge Plan: Other (Comment) (Memory Care)    Current Diagnoses: Patient Active Problem List   Diagnosis Date Noted  . Acquired hammer toe of left foot 05/26/2020  . Allergic rhinitis due to pollen 05/26/2020  . Atherosclerosis 05/26/2020  . Benign neoplasm of colon 05/26/2020  . Chronic obstructive pulmonary disease, unspecified ([place]) 05/26/2020  . Constipation 05/26/2020  . Decreased estrogen level 05/26/2020  . Hardening of the aorta (main artery of the heart) ([place]) 05/26/2020  . Hypercalcemia 05/26/2020  . Hyperlipidemia 05/26/2020  . Hypertension 05/26/2020  . Hypokalemia 05/26/2020  . Osteoporosis 05/26/2020  . Personal history of colonic polyps 05/26/2020  . Rectocele 05/26/2020  . Skin sensation disturbance 05/26/2020  . Tobacco dependence 05/26/2020  . Vaginal bleeding 05/26/2020  . Pain due to onychomycosis of toenails of both feet 07/17/2018  . Diabetes mellitus ([place]) 07/17/2018  . 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 Right lumbar radiculopathy 05/30/2013  . Diabetic neuropathy ([place]) 05/30/2013  . Cervical spondylosis without myelopathy 12/02/2012  . Lumbosacral spondylosis without myelopathy 12/02/2012  . Spondylolisthesis at L5-S1 level 12/02/2012  . AAA (abdominal aortic aneurysm) without rupture ([place]) 07/03/2012    Orientation RESPIRATION  BLADDER Height & Weight     Self,Situation,Place  Normal Continent Weight: 108 kg Height:  4\' 9"  (144.8 cm)  BEHAVIORAL SYMPTOMS/MOOD NEUROLOGICAL BOWEL NUTRITION STATUS      Continent Diet (regular)  AMBULATORY STATUS COMMUNICATION OF NEEDS Skin   Extensive Assist Verbally Normal                       Personal Care Assistance Level of Assistance  Bathing,Dressing,Feeding Bathing Assistance: Limited assistance Feeding assistance: Limited assistance Dressing Assistance: Limited assistance     Functional Limitations Info  Sight,Hearing,Speech Sight Info: Impaired (glasses) Hearing Info: Adequate Speech Info: Adequate    SPECIAL CARE FACTORS FREQUENCY  PT (By licensed PT),OT (By licensed OT)     PT Frequency: 5x per week OT Frequency: 5x per week            Contractures Contractures Info: Not present    Additional Factors Info  Code Status,Allergies Code Status Info: Full Code Allergies Info: No Known Allergies           Current Medications (06/22/2020):  This is the current hospital active medication list No current facility-administered medications for this encounter.   Current Outpatient Medications  Medication Sig Dispense Refill  . ACCU-CHEK AVIVA PLUS test strip USE AS DIRECTED TO TEST BLOOD SUGAR DAILY    . albuterol (VENTOLIN HFA) 108 (90 Base) MCG/ACT inhaler SMARTSIG:1 Puff(s) By Mouth Every 4 Hours PRN    . ANORO ELLIPTA 62.5-25 MCG/INH AEPB     . Ascorbic Acid (VITAMIN C) 1000 MG tablet Take by mouth 2 (two) times daily. Patient takes one tablet (1000 mg) daily during Spring, Summer, and Fall. Patient  takes one tablet (1000 mg) twice daily in the Winter.    Marland Kitchen aspirin (ASPIRIN CHILDRENS) 81 MG chewable tablet Chew 1 tablet (81 mg total) by mouth daily. 30 tablet 0  . b complex vitamins tablet Take 1 tablet by mouth daily.    Marland Kitchen BIOTIN PO Take 500 mg by mouth daily.     . Calcium Carbonate-Vitamin D 600-400 MG-UNIT per tablet Take 1 tablet by mouth  daily.     . cetirizine (ZYRTEC) 10 MG tablet Take 10 mg by mouth at bedtime.    Marland Kitchen denosumab (PROLIA) 60 MG/ML SOSY injection Inject 60 mg into the skin every 6 (six) months.    . diclofenac Sodium (VOLTAREN) 1 % GEL Apply 4 g topically 4 (four) times daily. 100 g 0  . fexofenadine (ALLEGRA) 180 MG tablet 1 tablet as needed    . fluticasone (FLONASE) 50 MCG/ACT nasal spray Place 2 sprays into both nostrils daily.    . hydrochlorothiazide (MICROZIDE) 12.5 MG capsule Take 12.5 mg by mouth daily.    Marland Kitchen KLOR-CON M20 20 MEQ tablet Take 20 mEq daily by mouth.    . lidocaine (LIDODERM) 5 % Place 1 patch onto the skin daily. Remove & Discard patch within 12 hours or as directed by MD 30 patch 0  . lisinopril (PRINIVIL,ZESTRIL) 5 MG tablet Take 5 mg by mouth at bedtime.     . Multiple Vitamins-Minerals (MULTIVITAMIN WITH MINERALS) tablet Take 1 tablet by mouth every evening.     . Omega-3 Fatty Acids (FISH OIL) 1000 MG CAPS Take by mouth. Fish oil 1000mg /300mg  Omega 3    . polyethylene glycol-electrolytes (NULYTELY) 420 g solution See admin instructions.    . pravastatin (PRAVACHOL) 20 MG tablet Take 20 mg by mouth at bedtime.     Marland Kitchen SHINGRIX injection     . tiZANidine (ZANAFLEX) 2 MG tablet Take 1 tablet (2 mg total) by mouth 2 (two) times daily. 180 tablet 0  . traMADol (ULTRAM) 50 MG tablet Take 1 tablet (50 mg total) by mouth every 8 (eight) hours as needed. no more than 3 per day 90 tablet 5     Discharge Medications: Please see discharge summary for a list of discharge medications.  Relevant Imaging Results:  Relevant Lab Results:   Additional Information SS # [id number]  Erenest Rasher, RN

## 2020-06-22 NOTE — ED Notes (Signed)
Pt pushing call light requesting paper and pen and to speak with the doctor.

## 2020-06-22 NOTE — NC FL2 (Signed)
[place] LEVEL OF CARE SCREENING TOOL     IDENTIFICATION  Patient Name: Kylie Hurst Birthdate: 1946-01-27 Sex: female Admission Date (Current Location): 06/22/2020  [facility] and [place] Number:  Herbalist and Address:  [facility],  [place] [address], [place]      Provider Number: [id number]  Attending Physician Name and Address:  Davonna Belling, MD  Relative Name and Phone Number:  Melrose Nakayama # [phone]    Current Level of Care: Hospital Recommended Level of Care: [place] Prior Approval Number:    Date Approved/Denied:   PASRR Number: [id number]  Discharge Plan: SNF    Current Diagnoses: Patient Active Problem List   Diagnosis Date Noted  . Acquired hammer toe of left foot 05/26/2020  . Allergic rhinitis due to pollen 05/26/2020  . Atherosclerosis 05/26/2020  . Benign neoplasm of colon 05/26/2020  . Chronic obstructive pulmonary disease, unspecified ([place]) 05/26/2020  . Constipation 05/26/2020  . Decreased estrogen level 05/26/2020  . Hardening of the aorta (main artery of the heart) ([place]) 05/26/2020  . Hypercalcemia 05/26/2020  . Hyperlipidemia 05/26/2020  . Hypertension 05/26/2020  . Hypokalemia 05/26/2020  . Osteoporosis 05/26/2020  . Personal history of colonic polyps 05/26/2020  . Rectocele 05/26/2020  . Skin sensation disturbance 05/26/2020  . Tobacco dependence 05/26/2020  . Vaginal bleeding 05/26/2020  . Pain due to onychomycosis of toenails of both feet 07/17/2018  . Diabetes mellitus ([place]) 07/17/2018  . 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 Right lumbar radiculopathy 05/30/2013  . Diabetic neuropathy ([place]) 05/30/2013  . Cervical spondylosis without myelopathy 12/02/2012  . Lumbosacral spondylosis without myelopathy 12/02/2012  . Spondylolisthesis at L5-S1 level 12/02/2012  . AAA (abdominal aortic aneurysm) without rupture ([place]) 07/03/2012    Orientation RESPIRATION BLADDER Height  & Weight     Self,Situation,Place  Normal Continent Weight: 108 kg Height:  4\' 9"  (144.8 cm)  BEHAVIORAL SYMPTOMS/MOOD NEUROLOGICAL BOWEL NUTRITION STATUS      Continent Diet (regular)  AMBULATORY STATUS COMMUNICATION OF NEEDS Skin   Extensive Assist Verbally Normal                       Personal Care Assistance Level of Assistance  Bathing,Dressing,Feeding Bathing Assistance: Limited assistance Feeding assistance: Limited assistance Dressing Assistance: Limited assistance     Functional Limitations Info  Sight,Hearing,Speech Sight Info: Impaired (glasses) Hearing Info: Adequate Speech Info: Adequate    SPECIAL CARE FACTORS FREQUENCY  PT (By licensed PT),OT (By licensed OT)     PT Frequency: 5x per week OT Frequency: 5x per week            Contractures Contractures Info: Not present    Additional Factors Info  Code Status,Allergies Code Status Info: Full Code Allergies Info: No Known Allergies           Current Medications (06/22/2020):  This is the current hospital active medication list No current facility-administered medications for this encounter.   Current Outpatient Medications  Medication Sig Dispense Refill  . ACCU-CHEK AVIVA PLUS test strip USE AS DIRECTED TO TEST BLOOD SUGAR DAILY    . albuterol (VENTOLIN HFA) 108 (90 Base) MCG/ACT inhaler SMARTSIG:1 Puff(s) By Mouth Every 4 Hours PRN    . ANORO ELLIPTA 62.5-25 MCG/INH AEPB     . Ascorbic Acid (VITAMIN C) 1000 MG tablet Take by mouth 2 (two) times daily. Patient takes one tablet (1000 mg) daily during Spring, Summer, and Fall. Patient takes one  tablet (1000 mg) twice daily in the Winter.    Marland Kitchen aspirin (ASPIRIN CHILDRENS) 81 MG chewable tablet Chew 1 tablet (81 mg total) by mouth daily. 30 tablet 0  . b complex vitamins tablet Take 1 tablet by mouth daily.    Marland Kitchen BIOTIN PO Take 500 mg by mouth daily.     . Calcium Carbonate-Vitamin D 600-400 MG-UNIT per tablet Take 1 tablet by mouth daily.     .  cetirizine (ZYRTEC) 10 MG tablet Take 10 mg by mouth at bedtime.    Marland Kitchen denosumab (PROLIA) 60 MG/ML SOSY injection Inject 60 mg into the skin every 6 (six) months.    . diclofenac Sodium (VOLTAREN) 1 % GEL Apply 4 g topically 4 (four) times daily. 100 g 0  . fexofenadine (ALLEGRA) 180 MG tablet 1 tablet as needed    . fluticasone (FLONASE) 50 MCG/ACT nasal spray Place 2 sprays into both nostrils daily.    . hydrochlorothiazide (MICROZIDE) 12.5 MG capsule Take 12.5 mg by mouth daily.    Marland Kitchen KLOR-CON M20 20 MEQ tablet Take 20 mEq daily by mouth.    . lidocaine (LIDODERM) 5 % Place 1 patch onto the skin daily. Remove & Discard patch within 12 hours or as directed by MD 30 patch 0  . lisinopril (PRINIVIL,ZESTRIL) 5 MG tablet Take 5 mg by mouth at bedtime.     . Multiple Vitamins-Minerals (MULTIVITAMIN WITH MINERALS) tablet Take 1 tablet by mouth every evening.     . Omega-3 Fatty Acids (FISH OIL) 1000 MG CAPS Take by mouth. Fish oil 1000mg /300mg  Omega 3    . polyethylene glycol-electrolytes (NULYTELY) 420 g solution See admin instructions.    . pravastatin (PRAVACHOL) 20 MG tablet Take 20 mg by mouth at bedtime.     Marland Kitchen SHINGRIX injection     . tiZANidine (ZANAFLEX) 2 MG tablet Take 1 tablet (2 mg total) by mouth 2 (two) times daily. 180 tablet 0  . traMADol (ULTRAM) 50 MG tablet Take 1 tablet (50 mg total) by mouth every 8 (eight) hours as needed. no more than 3 per day 90 tablet 5     Discharge Medications: Please see discharge summary for a list of discharge medications.  Relevant Imaging Results:  Relevant Lab Results:   Additional Information SS # [id number]  Erenest Rasher, RN

## 2020-06-22 NOTE — ED Notes (Signed)
Niece, Melrose Nakayama would like the nurse to call her and give her an update. Phone number is in the chart.

## 2020-06-22 NOTE — ED Notes (Signed)
Pt requesting pain medication for back pain. Pt requesting snacks and refreshments. Requesting for extra blanket to come off of her bed. All needs addressed and MD notified about pain

## 2020-06-22 NOTE — Progress Notes (Signed)
TOC CM received call from [facility] per niece request. Spoke to [facility], # [phone], admission coordinator and states she will need to complete her SNF rehab before coming to Memory Care. Requested CM fax # [phone] FL2 and facesheet. She will be able to complete her face to face assessment at the SNF rehab. [place], [place] ED TOC CM [phone]

## 2020-06-22 NOTE — ED Notes (Signed)
Patient back from CT. Asking for tramadol and wanting to go home now. [place], EDP made aware.

## 2020-06-23 DIAGNOSIS — F0390 Unspecified dementia without behavioral disturbance: Secondary | ICD-10-CM | POA: Diagnosis not present

## 2020-06-23 DIAGNOSIS — U071 COVID-19: Secondary | ICD-10-CM | POA: Diagnosis not present

## 2020-06-23 MED ORDER — B COMPLEX-C PO TABS
1.0000 | ORAL_TABLET | Freq: Every day | ORAL | Status: DC
  Administered 2020-06-23 – 2020-06-25 (×3): 1 via ORAL
  Filled 2020-06-23 (×3): qty 1

## 2020-06-23 MED ORDER — NICOTINE 21 MG/24HR TD PT24
21.0000 mg | MEDICATED_PATCH | Freq: Once | TRANSDERMAL | Status: AC
  Administered 2020-06-24: 21 mg via TRANSDERMAL
  Filled 2020-06-23: qty 1

## 2020-06-23 MED ORDER — CALCIUM CARBONATE-VITAMIN D 500-200 MG-UNIT PO TABS
1.0000 | ORAL_TABLET | Freq: Every day | ORAL | Status: DC
  Administered 2020-06-24 – 2020-06-25 (×2): 1 via ORAL
  Filled 2020-06-23 (×4): qty 1

## 2020-06-23 MED ORDER — OMEGA-3-ACID ETHYL ESTERS 1 G PO CAPS
1.0000 g | ORAL_CAPSULE | Freq: Every day | ORAL | Status: DC
  Administered 2020-06-24 – 2020-06-25 (×2): 1 g via ORAL
  Filled 2020-06-23 (×2): qty 1

## 2020-06-23 MED ORDER — B COMPLEX PO TABS: 1.0000 | ORAL_TABLET | Freq: Every day | ORAL | Status: DC

## 2020-06-23 MED ORDER — GUAIFENESIN ER 600 MG PO TB12
600.0000 mg | ORAL_TABLET | Freq: Two times a day (BID) | ORAL | Status: DC
  Administered 2020-06-23 – 2020-06-25 (×6): 600 mg via ORAL
  Filled 2020-06-23 (×6): qty 1

## 2020-06-23 MED ORDER — CALCIUM CARBONATE-VITAMIN D 600-400 MG-UNIT PO TABS: 1.0000 | ORAL_TABLET | Freq: Every day | ORAL | Status: DC

## 2020-06-23 MED ORDER — HYDROXYZINE HCL 25 MG PO TABS
25.0000 mg | ORAL_TABLET | Freq: Every evening | ORAL | Status: DC | PRN
  Administered 2020-06-23 – 2020-06-24 (×2): 25 mg via ORAL
  Filled 2020-06-23 (×3): qty 1

## 2020-06-23 MED ORDER — FLUTICASONE PROPIONATE 50 MCG/ACT NA SUSP
1.0000 | Freq: Every day | NASAL | Status: DC
  Administered 2020-06-24 – 2020-06-25 (×3): 1 via NASAL
  Filled 2020-06-23 (×2): qty 16

## 2020-06-23 MED ORDER — SENNOSIDES-DOCUSATE SODIUM 8.6-50 MG PO TABS: 2.0000 | ORAL_TABLET | Freq: Every evening | ORAL | Status: DC | PRN

## 2020-06-23 MED ORDER — UMECLIDINIUM-VILANTEROL 62.5-25 MCG/INH IN AEPB
1.0000 | INHALATION_SPRAY | Freq: Every morning | RESPIRATORY_TRACT | Status: DC
  Filled 2020-06-23: qty 14

## 2020-06-23 MED ORDER — MULTI-VITAMIN/MINERALS PO TABS: 1.0000 | ORAL_TABLET | Freq: Every evening | ORAL | Status: DC

## 2020-06-23 MED ORDER — LISINOPRIL 10 MG PO TABS
5.0000 mg | ORAL_TABLET | Freq: Every day | ORAL | Status: DC
  Administered 2020-06-23 – 2020-06-25 (×3): 5 mg via ORAL
  Filled 2020-06-23 (×3): qty 1

## 2020-06-23 MED ORDER — FISH OIL 1000 MG PO CAPS: 1.0000 | ORAL_CAPSULE | Freq: Every day | ORAL | Status: DC

## 2020-06-23 MED ORDER — ASCORBIC ACID 500 MG PO TABS: 1000.0000 mg | ORAL_TABLET | ORAL | Status: DC

## 2020-06-23 MED ORDER — TRAMADOL HCL 50 MG PO TABS
50.0000 mg | ORAL_TABLET | Freq: Once | ORAL | Status: AC
  Administered 2020-06-23: 50 mg via ORAL
  Filled 2020-06-23: qty 1

## 2020-06-23 MED ORDER — ALUM & MAG HYDROXIDE-SIMETH 200-200-20 MG/5ML PO SUSP
30.0000 mL | Freq: Once | ORAL | Status: AC
  Administered 2020-06-23: 30 mL via ORAL
  Filled 2020-06-23: qty 30

## 2020-06-23 MED ORDER — HYDROCHLOROTHIAZIDE 12.5 MG PO CAPS
12.5000 mg | ORAL_CAPSULE | Freq: Every evening | ORAL | Status: DC
  Administered 2020-06-23 – 2020-06-25 (×3): 12.5 mg via ORAL
  Filled 2020-06-23 (×3): qty 1

## 2020-06-23 MED ORDER — PRAVASTATIN SODIUM 20 MG PO TABS
20.0000 mg | ORAL_TABLET | Freq: Every day | ORAL | Status: DC
  Administered 2020-06-23 – 2020-06-25 (×3): 20 mg via ORAL
  Filled 2020-06-23 (×3): qty 1

## 2020-06-23 MED ORDER — ADULT MULTIVITAMIN W/MINERALS CH
1.0000 | ORAL_TABLET | Freq: Every day | ORAL | Status: DC
  Administered 2020-06-23 – 2020-06-25 (×3): 1 via ORAL
  Filled 2020-06-23 (×3): qty 1

## 2020-06-23 MED ORDER — ASCORBIC ACID 500 MG PO TABS
1000.0000 mg | ORAL_TABLET | Freq: Every day | ORAL | Status: DC
  Administered 2020-06-23 – 2020-06-25 (×3): 1000 mg via ORAL
  Filled 2020-06-23 (×3): qty 2

## 2020-06-23 NOTE — ED Notes (Signed)
Patient is threatening to get out of bed. RN review safety plan with the patient. Bed alarm has been set.

## 2020-06-23 NOTE — ED Notes (Signed)
Pt placed on 2L [place] for O2 saturation being at 87% when sleeping

## 2020-06-23 NOTE — ED Provider Notes (Signed)
Emergency Medicine Observation Re-evaluation Note  Kylie Hurst is a 75 y.o. female, seen on rounds today.  Pt initially presented to the ED for complaints of Dementia Currently, the patient is sleeping.  Physical Exam  BP (!) 118/59   Pulse 62   Temp 98.4 F (36.9 C) (Oral)   Resp 15   Ht 4\' 9"  (1.448 m)   Wt 108 kg   SpO2 97%   BMI 51.52 kg/m  Physical Exam General: NAD Cardiac: regular rate Lungs: no respiratory distress or tachypnea Psych: intermittently agitated but redirectable  ED Course / MDM  EKG:   I have reviewed the labs performed to date as well as medications administered while in observation.  Recent changes in the last 24 hours include desat to 87% while sleeping and placed on 2L and now 100%.  Has been eating and drinking but agitated due to being quarentined to her room.  Plan  Current plan is for need placement.  PT eval pending for today.  [address] Genevive Hurst is the plan.  TOC following.      Blanchie Dessert, MD 06/23/20 [phone]

## 2020-06-23 NOTE — ED Notes (Signed)
Patient Daughter Kylie Hurst has been updated on the patient's situation.

## 2020-06-23 NOTE — Evaluation (Signed)
Physical Therapy Evaluation Patient Details Name: Kylie Hurst MRN: [id number] DOB: [birth year] Today's Date: 06/23/2020   History of Present Illness  75 y.o. female presented to ED with constipation & confusion. + COVID, asymptomatic. PMH includes cervical fusion 2014, HTN, DM.  Clinical Impression  Pt admitted with above diagnosis. Pt reports she lives alone and can no longer manage safely at home alone, she stated she has very little support from family available. Pt does not drive, she denies falls in the past year. Today she ambulated 52' with hand held assist of 1, distance limited by fatigue and BLE pain. She would like to pursue SNF placement.  Pt currently with functional limitations due to the deficits listed below (see PT Problem List). Pt will benefit from skilled PT to increase their independence and safety with mobility to allow discharge to the venue listed below.       Follow Up Recommendations Supervision for mobility/OOB;SNF; 24 hour assist    Equipment Recommendations  None recommended by PT    Recommendations for Other Services       Precautions / Restrictions Precautions Precautions: Fall Precaution Comments: denies h/o falls in past 1 year Restrictions Weight Bearing Restrictions: No      Mobility  Bed Mobility Overal bed mobility: Modified Independent             General bed mobility comments: used rail    Transfers Overall transfer level: Needs assistance Equipment used: 1 person hand held assist Transfers: Sit to/from Stand Sit to Stand: Min guard         General transfer comment: sit to stand x 2, min/guard for safety, pt reports dizziness in standing (see flowsheets for orthostatics) which did not resolve after 1-2 minutes  Ambulation/Gait Ambulation/Gait assistance: Min assist Gait Distance (Feet): 30 Feet Assistive device: 1 person hand held assist Gait Pattern/deviations: Step-through pattern;Decreased stride length Gait  velocity: decr   General Gait Details: walked around foot of bed and then back with hand held assist of 1  as well as reaching for bedrails, distance limited by fatigue, pt reported BLE pain with walking  Stairs            Wheelchair Mobility    Modified Rankin (Stroke Patients Only)       Balance Overall balance assessment: Needs assistance Sitting-balance support: No upper extremity supported;Feet unsupported Sitting balance-Leahy Scale: Fair     Standing balance support: Single extremity supported Standing balance-Leahy Scale: Poor Standing balance comment: relies on single UE support static, single & intermittent double UE support with walking                             Pertinent Vitals/Pain Pain Assessment: Faces Faces Pain Scale: Hurts little more Pain Location: BLEs and back with walking Pain Descriptors / Indicators: Sore Pain Intervention(s): Limited activity within patient's tolerance;Monitored during session;Repositioned    Home Living Family/patient expects to be discharged to:: Private residence Living Arrangements: Alone Available Help at Discharge: Family;Available PRN/intermittently Type of Home: Apartment Home Access: Level entry     Home Layout: One level Home Equipment: Walker - 4 wheels      Prior Function Level of Independence: Independent         Comments: reports she doesn't use an assistive device, denies falls, niece and sister sometimes can assist pt with getting groceries but not consistently, pt doesn't drive, sponge bathes     Hand Dominance  Extremity/Trunk Assessment   Upper Extremity Assessment Upper Extremity Assessment: Generalized weakness    Lower Extremity Assessment Lower Extremity Assessment: Generalized weakness (B knee ext -4/5)       Communication   Communication: No difficulties  Cognition Arousal/Alertness: Awake/alert Behavior During Therapy: WFL for tasks  assessed/performed Overall Cognitive Status: No family/caregiver present to determine baseline cognitive functioning                                 General Comments: pt able to follow commands, is fully oriented, gets annoyed by my questions, easily distracted      General Comments      Exercises     Assessment/Plan    PT Assessment Patient needs continued PT services  PT Problem List Decreased activity tolerance;Decreased balance;Decreased cognition;Decreased mobility       PT Treatment Interventions Therapeutic activities;Gait training;Therapeutic exercise;Balance training    PT Goals (Current goals can be found in the Care Plan section)  Acute Rehab PT Goals Patient Stated Goal: to go to a nursing home, "I can't live alone anymore, I have dementia." PT Goal Formulation: With patient Time For Goal Achievement: 07/07/20 Potential to Achieve Goals: Good    Frequency Min 2X/week   Barriers to discharge        Co-evaluation               AM-PAC PT "6 Clicks" Mobility  Outcome Measure Help needed turning from your back to your side while in a flat bed without using bedrails?: None Help needed moving from lying on your back to sitting on the side of a flat bed without using bedrails?: A Little Help needed moving to and from a bed to a chair (including a wheelchair)?: A Little Help needed standing up from a chair using your arms (e.g., wheelchair or bedside chair)?: A Little Help needed to walk in hospital room?: A Little Help needed climbing 3-5 steps with a railing? : A Little 6 Click Score: 19    End of Session Equipment Utilized During Treatment: Gait belt Activity Tolerance: Patient tolerated treatment well;Patient limited by fatigue Patient left: in bed;with call bell/phone within reach;with nursing/sitter in room Nurse Communication: Mobility status PT Visit Diagnosis: Difficulty in walking, not elsewhere classified (R26.2);Pain Pain - part  of body: Leg    Time: 1583-0940 PT Time Calculation (min) (ACUTE ONLY): 21 min   Charges:   PT Evaluation $PT Eval Moderate Complexity: 1 Mod         Philomena Doheny PT 06/23/2020  Acute Rehabilitation Services Pager [phone] Office [phone]

## 2020-06-23 NOTE — ED Notes (Signed)
Pt needs home medications ordered, MD messaged.

## 2020-06-23 NOTE — Progress Notes (Addendum)
239 pm Will need to recheck COVID test on 06/27/2020. Pt has acceptance for SNF, but will need to complete quarantine. [facility] accepted. Jonnie Finner RN CCM, [facility] ED TOC CM [phone]  141 pm Received call back from [place] and they cannot accept due to [place]. [place] and they cannot accept due to COVID, she will have to complete quarantine prior to coming. Jonnie Finner RN CCM, [facility] ED TOC CM [phone]  [facility], spoke to [place], needs 10 day quarantine, will revisit after that time [facility] -no beds available for [place] spoke to [facility] will accept after 10 day quarantine [facility] (has memory care)-spoke, Maudie Mercury will review once 5 quarantine days   1230 pm TOC CM contacted [place] for COVID SNF rehab bed. States she will give call back. [place]. States no COVID beds and she will have to complete 5 day quarantine.  [place], [place] ED TOC CM [phone]

## 2020-06-24 ENCOUNTER — Telehealth: Payer: Self-pay

## 2020-06-24 DIAGNOSIS — F0390 Unspecified dementia without behavioral disturbance: Secondary | ICD-10-CM | POA: Diagnosis not present

## 2020-06-24 MED ORDER — TIZANIDINE HCL 4 MG PO TABS
2.0000 mg | ORAL_TABLET | Freq: Once | ORAL | Status: AC
  Administered 2020-06-24: 2 mg via ORAL
  Filled 2020-06-24: qty 1

## 2020-06-24 NOTE — Telephone Encounter (Signed)
She states that they are not giving her Tramadol within 8 hours, they are giving it to her over 8 hours. She would like it scheduled for every 6 hours than every 8 hours because she is in pain. She would like to talk personally about this matter. Call [phone].

## 2020-06-24 NOTE — Telephone Encounter (Signed)
Patient states she is currently admitted at  [facility] with Covid. She states she in a room in the ED. She needs her Tramadol to be scheduled with the pharmacist at [facility]. She states that Dr. Letta Pate has to call and get the pharmacist to schedule. As looking through patients the schedule for Tramadol is the same that Dr. Letta Pate wrote which is one every 8 hours as needed, no more than 3 a day. I tried to call the patient back to let her know but no answer. I had to leave a message.

## 2020-06-25 DIAGNOSIS — R404 Transient alteration of awareness: Secondary | ICD-10-CM | POA: Diagnosis not present

## 2020-06-25 DIAGNOSIS — Z79899 Other long term (current) drug therapy: Secondary | ICD-10-CM | POA: Diagnosis not present

## 2020-06-25 DIAGNOSIS — F0390 Unspecified dementia without behavioral disturbance: Secondary | ICD-10-CM | POA: Diagnosis not present

## 2020-06-25 DIAGNOSIS — N816 Rectocele: Secondary | ICD-10-CM | POA: Diagnosis not present

## 2020-06-25 DIAGNOSIS — Z7982 Long term (current) use of aspirin: Secondary | ICD-10-CM | POA: Diagnosis not present

## 2020-06-25 DIAGNOSIS — I7090 Unspecified atherosclerosis: Secondary | ICD-10-CM | POA: Diagnosis not present

## 2020-06-25 DIAGNOSIS — U071 COVID-19: Secondary | ICD-10-CM | POA: Diagnosis not present

## 2020-06-25 DIAGNOSIS — R1310 Dysphagia, unspecified: Secondary | ICD-10-CM | POA: Diagnosis not present

## 2020-06-25 DIAGNOSIS — I10 Essential (primary) hypertension: Secondary | ICD-10-CM | POA: Diagnosis not present

## 2020-06-25 DIAGNOSIS — K5900 Constipation, unspecified: Secondary | ICD-10-CM | POA: Diagnosis not present

## 2020-06-25 DIAGNOSIS — J302 Other seasonal allergic rhinitis: Secondary | ICD-10-CM | POA: Diagnosis not present

## 2020-06-25 DIAGNOSIS — J449 Chronic obstructive pulmonary disease, unspecified: Secondary | ICD-10-CM | POA: Diagnosis not present

## 2020-06-25 DIAGNOSIS — R1084 Generalized abdominal pain: Secondary | ICD-10-CM | POA: Diagnosis not present

## 2020-06-25 DIAGNOSIS — E876 Hypokalemia: Secondary | ICD-10-CM | POA: Diagnosis not present

## 2020-06-25 DIAGNOSIS — E785 Hyperlipidemia, unspecified: Secondary | ICD-10-CM | POA: Diagnosis not present

## 2020-06-25 DIAGNOSIS — F17210 Nicotine dependence, cigarettes, uncomplicated: Secondary | ICD-10-CM | POA: Diagnosis not present

## 2020-06-25 DIAGNOSIS — M5416 Radiculopathy, lumbar region: Secondary | ICD-10-CM | POA: Diagnosis not present

## 2020-06-25 DIAGNOSIS — I714 Abdominal aortic aneurysm, without rupture: Secondary | ICD-10-CM | POA: Diagnosis not present

## 2020-06-25 DIAGNOSIS — Z743 Need for continuous supervision: Secondary | ICD-10-CM | POA: Diagnosis not present

## 2020-06-25 DIAGNOSIS — R262 Difficulty in walking, not elsewhere classified: Secondary | ICD-10-CM | POA: Diagnosis not present

## 2020-06-25 DIAGNOSIS — I700 Atherosclerosis of aorta: Secondary | ICD-10-CM | POA: Diagnosis not present

## 2020-06-25 DIAGNOSIS — M6281 Muscle weakness (generalized): Secondary | ICD-10-CM | POA: Diagnosis not present

## 2020-06-25 DIAGNOSIS — M810 Age-related osteoporosis without current pathological fracture: Secondary | ICD-10-CM | POA: Diagnosis not present

## 2020-06-25 DIAGNOSIS — N390 Urinary tract infection, site not specified: Secondary | ICD-10-CM | POA: Diagnosis not present

## 2020-06-25 DIAGNOSIS — E119 Type 2 diabetes mellitus without complications: Secondary | ICD-10-CM | POA: Diagnosis not present

## 2020-06-25 DIAGNOSIS — R109 Unspecified abdominal pain: Secondary | ICD-10-CM | POA: Diagnosis not present

## 2020-06-25 DIAGNOSIS — Z1159 Encounter for screening for other viral diseases: Secondary | ICD-10-CM | POA: Diagnosis not present

## 2020-06-25 MED ORDER — NICOTINE 21 MG/24HR TD PT24
21.0000 mg | MEDICATED_PATCH | Freq: Once | TRANSDERMAL | Status: DC
  Administered 2020-06-25: 21 mg via TRANSDERMAL
  Filled 2020-06-25: qty 1

## 2020-06-25 NOTE — Progress Notes (Signed)
TOC CSW spoke with [employer].  The information below is from the facility and will be shared with Caryl Pina, Therapist, sports.  Room #:  34B  Call Report #:  [phone]  Zackerie Sara Tarpley-Carter, MSW, LCSW-A Pronouns:  She, Her, Hers                  [place] ED Transitions of CareClinical Social Worker [email or web address] [phone]

## 2020-06-25 NOTE — Progress Notes (Signed)
TOC CSW received contact from [employer] that [facility] accepts pt.  [name], MSW, LCSW-A Pronouns:  She, Her, Hers                  [facility] ED Transitions of CareClinical Social Worker [email or web address] [phone]

## 2020-06-25 NOTE — ED Notes (Signed)
Patient provided diet ginger ale and ice

## 2020-06-25 NOTE — ED Notes (Signed)
Patient requesting spo2 be removed from her finger.

## 2020-06-25 NOTE — ED Notes (Signed)
[facility] called for transportation to [place]

## 2020-06-25 NOTE — Progress Notes (Signed)
TOC CSW started pts [employer] auth.  Reference #:  [id number]  Fax #:  [phone]  Tasheena Wambolt Tarpley-Carter, MSW, LCSW-A Pronouns:  She, Her, [place] ED Transitions of [place] [email or web address] [phone]

## 2020-06-25 NOTE — ED Notes (Signed)
Patient provided diet ginger ale and chicken noodle soup

## 2020-06-25 NOTE — Progress Notes (Signed)
TOC CSW added [facility] as facility to [employer].  Kylie Hurst, MSW, LCSW-A Pronouns:  She, Her, Hers                  [place] ED Transitions of CareClinical Social Worker [email or web address] [phone]

## 2020-06-25 NOTE — Progress Notes (Signed)
TOC CSW discovered pt has not been vaccinated for COVID.  Hezikiah Retzloff Tarpley-Carter, MSW, LCSW-A Pronouns:  She, Her, [place] ED Transitions of CareClinical Social Worker [email or web address] [phone]

## 2020-06-25 NOTE — Progress Notes (Signed)
TOC CSW received approval for auth from [place] with the following information.  Reviewer:  Jones Skene  Start Date:  06/25/2020  End Date:  06/29/2020  Reference #:  [id number]  Clemmie Buelna Tarpley-Carter, MSW, LCSW-A Pronouns:  She, Her, [place] ED Transitions of CareClinical Social Worker [email or web address] [phone]

## 2020-06-25 NOTE — ED Notes (Signed)
Patient was given her meds. A sandwich and something to drink.

## 2020-06-26 DIAGNOSIS — F0390 Unspecified dementia without behavioral disturbance: Secondary | ICD-10-CM | POA: Diagnosis not present

## 2020-06-30 ENCOUNTER — Ambulatory Visit: Payer: Medicare Other | Admitting: Podiatry

## 2020-06-30 NOTE — Telephone Encounter (Signed)
Patient is currently at [facility] in [place]. Patient states they are not giving her Tramadol on time and she thinks its because the directions says every 8 hours as needed. She would like the directions changed to every 8 hours without as needed on it or take every 6 hours, in hopes she will get in on time because she is in a lot of pain.

## 2020-07-01 NOTE — Telephone Encounter (Signed)
Called and spoke to [place] and she states she will contact the facility doctor and he will contact Dr. Letta Pate.

## 2020-07-07 DIAGNOSIS — I714 Abdominal aortic aneurysm, without rupture: Secondary | ICD-10-CM | POA: Diagnosis not present

## 2020-07-07 DIAGNOSIS — N390 Urinary tract infection, site not specified: Secondary | ICD-10-CM | POA: Diagnosis not present

## 2020-07-07 DIAGNOSIS — R1084 Generalized abdominal pain: Secondary | ICD-10-CM | POA: Diagnosis not present

## 2020-07-09 DIAGNOSIS — N390 Urinary tract infection, site not specified: Secondary | ICD-10-CM | POA: Diagnosis not present

## 2020-07-09 DIAGNOSIS — R109 Unspecified abdominal pain: Secondary | ICD-10-CM | POA: Diagnosis not present

## 2020-07-16 DIAGNOSIS — K5900 Constipation, unspecified: Secondary | ICD-10-CM | POA: Diagnosis not present

## 2020-07-27 DIAGNOSIS — J302 Other seasonal allergic rhinitis: Secondary | ICD-10-CM | POA: Diagnosis not present

## 2020-07-27 DIAGNOSIS — M810 Age-related osteoporosis without current pathological fracture: Secondary | ICD-10-CM | POA: Diagnosis not present

## 2020-07-27 DIAGNOSIS — M5416 Radiculopathy, lumbar region: Secondary | ICD-10-CM | POA: Diagnosis not present

## 2020-07-27 DIAGNOSIS — I10 Essential (primary) hypertension: Secondary | ICD-10-CM | POA: Diagnosis not present

## 2020-07-27 DIAGNOSIS — J449 Chronic obstructive pulmonary disease, unspecified: Secondary | ICD-10-CM | POA: Diagnosis not present

## 2020-07-27 DIAGNOSIS — M6281 Muscle weakness (generalized): Secondary | ICD-10-CM | POA: Diagnosis not present

## 2020-07-27 DIAGNOSIS — Z8616 Personal history of COVID-19: Secondary | ICD-10-CM | POA: Diagnosis not present

## 2020-08-04 DIAGNOSIS — G894 Chronic pain syndrome: Secondary | ICD-10-CM | POA: Diagnosis not present

## 2020-08-04 DIAGNOSIS — E119 Type 2 diabetes mellitus without complications: Secondary | ICD-10-CM | POA: Diagnosis not present

## 2020-08-04 DIAGNOSIS — I10 Essential (primary) hypertension: Secondary | ICD-10-CM | POA: Diagnosis not present

## 2020-08-04 DIAGNOSIS — M47816 Spondylosis without myelopathy or radiculopathy, lumbar region: Secondary | ICD-10-CM | POA: Diagnosis not present

## 2020-08-04 DIAGNOSIS — U071 COVID-19: Secondary | ICD-10-CM | POA: Diagnosis not present

## 2020-08-04 DIAGNOSIS — M6281 Muscle weakness (generalized): Secondary | ICD-10-CM | POA: Diagnosis not present

## 2020-08-04 DIAGNOSIS — M810 Age-related osteoporosis without current pathological fracture: Secondary | ICD-10-CM | POA: Diagnosis not present

## 2020-08-04 DIAGNOSIS — J449 Chronic obstructive pulmonary disease, unspecified: Secondary | ICD-10-CM | POA: Diagnosis not present

## 2020-08-06 DIAGNOSIS — E119 Type 2 diabetes mellitus without complications: Secondary | ICD-10-CM | POA: Diagnosis not present

## 2020-08-06 DIAGNOSIS — E785 Hyperlipidemia, unspecified: Secondary | ICD-10-CM | POA: Diagnosis not present

## 2020-08-06 DIAGNOSIS — M5030 Other cervical disc degeneration, unspecified cervical region: Secondary | ICD-10-CM | POA: Diagnosis not present

## 2020-08-06 DIAGNOSIS — K219 Gastro-esophageal reflux disease without esophagitis: Secondary | ICD-10-CM | POA: Diagnosis not present

## 2020-08-06 DIAGNOSIS — G8929 Other chronic pain: Secondary | ICD-10-CM | POA: Diagnosis not present

## 2020-08-06 DIAGNOSIS — G309 Alzheimer's disease, unspecified: Secondary | ICD-10-CM | POA: Diagnosis not present

## 2020-08-06 DIAGNOSIS — I10 Essential (primary) hypertension: Secondary | ICD-10-CM | POA: Diagnosis not present

## 2020-08-06 DIAGNOSIS — K5900 Constipation, unspecified: Secondary | ICD-10-CM | POA: Diagnosis not present

## 2020-08-06 DIAGNOSIS — J449 Chronic obstructive pulmonary disease, unspecified: Secondary | ICD-10-CM | POA: Diagnosis not present

## 2020-08-09 DIAGNOSIS — E119 Type 2 diabetes mellitus without complications: Secondary | ICD-10-CM | POA: Diagnosis not present

## 2020-08-09 DIAGNOSIS — G309 Alzheimer's disease, unspecified: Secondary | ICD-10-CM | POA: Diagnosis not present

## 2020-08-09 DIAGNOSIS — I10 Essential (primary) hypertension: Secondary | ICD-10-CM | POA: Diagnosis not present

## 2020-08-09 DIAGNOSIS — G894 Chronic pain syndrome: Secondary | ICD-10-CM | POA: Diagnosis not present

## 2020-08-09 DIAGNOSIS — E785 Hyperlipidemia, unspecified: Secondary | ICD-10-CM | POA: Diagnosis not present

## 2020-08-09 DIAGNOSIS — J449 Chronic obstructive pulmonary disease, unspecified: Secondary | ICD-10-CM | POA: Diagnosis not present

## 2020-08-09 DIAGNOSIS — M810 Age-related osteoporosis without current pathological fracture: Secondary | ICD-10-CM | POA: Diagnosis not present

## 2020-08-09 DIAGNOSIS — E559 Vitamin D deficiency, unspecified: Secondary | ICD-10-CM | POA: Diagnosis not present

## 2020-08-09 DIAGNOSIS — M6281 Muscle weakness (generalized): Secondary | ICD-10-CM | POA: Diagnosis not present

## 2020-08-09 DIAGNOSIS — U071 COVID-19: Secondary | ICD-10-CM | POA: Diagnosis not present

## 2020-08-09 DIAGNOSIS — M47816 Spondylosis without myelopathy or radiculopathy, lumbar region: Secondary | ICD-10-CM | POA: Diagnosis not present

## 2020-08-09 DIAGNOSIS — D518 Other vitamin B12 deficiency anemias: Secondary | ICD-10-CM | POA: Diagnosis not present

## 2020-08-09 DIAGNOSIS — E038 Other specified hypothyroidism: Secondary | ICD-10-CM | POA: Diagnosis not present

## 2020-08-10 DIAGNOSIS — E119 Type 2 diabetes mellitus without complications: Secondary | ICD-10-CM | POA: Diagnosis not present

## 2020-08-10 DIAGNOSIS — E559 Vitamin D deficiency, unspecified: Secondary | ICD-10-CM | POA: Diagnosis not present

## 2020-08-10 DIAGNOSIS — Z79899 Other long term (current) drug therapy: Secondary | ICD-10-CM | POA: Diagnosis not present

## 2020-08-10 DIAGNOSIS — D518 Other vitamin B12 deficiency anemias: Secondary | ICD-10-CM | POA: Diagnosis not present

## 2020-08-10 DIAGNOSIS — E7849 Other hyperlipidemia: Secondary | ICD-10-CM | POA: Diagnosis not present

## 2020-08-11 ENCOUNTER — Ambulatory Visit: Payer: Medicare Other | Admitting: Podiatry

## 2020-08-11 ENCOUNTER — Other Ambulatory Visit: Payer: Self-pay | Admitting: Family Medicine

## 2020-08-11 DIAGNOSIS — Z1231 Encounter for screening mammogram for malignant neoplasm of breast: Secondary | ICD-10-CM

## 2020-08-16 DIAGNOSIS — M47816 Spondylosis without myelopathy or radiculopathy, lumbar region: Secondary | ICD-10-CM | POA: Diagnosis not present

## 2020-08-16 DIAGNOSIS — I10 Essential (primary) hypertension: Secondary | ICD-10-CM | POA: Diagnosis not present

## 2020-08-16 DIAGNOSIS — E119 Type 2 diabetes mellitus without complications: Secondary | ICD-10-CM | POA: Diagnosis not present

## 2020-08-16 DIAGNOSIS — M6281 Muscle weakness (generalized): Secondary | ICD-10-CM | POA: Diagnosis not present

## 2020-08-16 DIAGNOSIS — U071 COVID-19: Secondary | ICD-10-CM | POA: Diagnosis not present

## 2020-08-16 DIAGNOSIS — G894 Chronic pain syndrome: Secondary | ICD-10-CM | POA: Diagnosis not present

## 2020-08-16 DIAGNOSIS — J449 Chronic obstructive pulmonary disease, unspecified: Secondary | ICD-10-CM | POA: Diagnosis not present

## 2020-08-16 DIAGNOSIS — M810 Age-related osteoporosis without current pathological fracture: Secondary | ICD-10-CM | POA: Diagnosis not present

## 2020-08-17 DIAGNOSIS — J449 Chronic obstructive pulmonary disease, unspecified: Secondary | ICD-10-CM | POA: Diagnosis not present

## 2020-08-17 DIAGNOSIS — M810 Age-related osteoporosis without current pathological fracture: Secondary | ICD-10-CM | POA: Diagnosis not present

## 2020-08-17 DIAGNOSIS — I10 Essential (primary) hypertension: Secondary | ICD-10-CM | POA: Diagnosis not present

## 2020-08-17 DIAGNOSIS — U071 COVID-19: Secondary | ICD-10-CM | POA: Diagnosis not present

## 2020-08-17 DIAGNOSIS — E119 Type 2 diabetes mellitus without complications: Secondary | ICD-10-CM | POA: Diagnosis not present

## 2020-08-17 DIAGNOSIS — M6281 Muscle weakness (generalized): Secondary | ICD-10-CM | POA: Diagnosis not present

## 2020-08-17 DIAGNOSIS — M47816 Spondylosis without myelopathy or radiculopathy, lumbar region: Secondary | ICD-10-CM | POA: Diagnosis not present

## 2020-08-17 DIAGNOSIS — G894 Chronic pain syndrome: Secondary | ICD-10-CM | POA: Diagnosis not present

## 2020-08-19 ENCOUNTER — Other Ambulatory Visit: Payer: Self-pay | Admitting: Adult Health

## 2020-08-19 DIAGNOSIS — M810 Age-related osteoporosis without current pathological fracture: Secondary | ICD-10-CM

## 2020-08-23 DIAGNOSIS — J449 Chronic obstructive pulmonary disease, unspecified: Secondary | ICD-10-CM | POA: Diagnosis not present

## 2020-08-23 DIAGNOSIS — U071 COVID-19: Secondary | ICD-10-CM | POA: Diagnosis not present

## 2020-08-23 DIAGNOSIS — M6281 Muscle weakness (generalized): Secondary | ICD-10-CM | POA: Diagnosis not present

## 2020-08-23 DIAGNOSIS — M810 Age-related osteoporosis without current pathological fracture: Secondary | ICD-10-CM | POA: Diagnosis not present

## 2020-08-23 DIAGNOSIS — I10 Essential (primary) hypertension: Secondary | ICD-10-CM | POA: Diagnosis not present

## 2020-08-23 DIAGNOSIS — E119 Type 2 diabetes mellitus without complications: Secondary | ICD-10-CM | POA: Diagnosis not present

## 2020-08-23 DIAGNOSIS — G894 Chronic pain syndrome: Secondary | ICD-10-CM | POA: Diagnosis not present

## 2020-08-23 DIAGNOSIS — M47816 Spondylosis without myelopathy or radiculopathy, lumbar region: Secondary | ICD-10-CM | POA: Diagnosis not present

## 2020-08-24 ENCOUNTER — Telehealth: Payer: Self-pay

## 2020-08-24 DIAGNOSIS — E119 Type 2 diabetes mellitus without complications: Secondary | ICD-10-CM | POA: Diagnosis not present

## 2020-08-24 DIAGNOSIS — R1310 Dysphagia, unspecified: Secondary | ICD-10-CM | POA: Diagnosis not present

## 2020-08-24 DIAGNOSIS — M5030 Other cervical disc degeneration, unspecified cervical region: Secondary | ICD-10-CM | POA: Diagnosis not present

## 2020-08-24 DIAGNOSIS — J449 Chronic obstructive pulmonary disease, unspecified: Secondary | ICD-10-CM | POA: Diagnosis not present

## 2020-08-24 DIAGNOSIS — M47816 Spondylosis without myelopathy or radiculopathy, lumbar region: Secondary | ICD-10-CM | POA: Diagnosis not present

## 2020-08-24 DIAGNOSIS — K5900 Constipation, unspecified: Secondary | ICD-10-CM | POA: Diagnosis not present

## 2020-08-24 DIAGNOSIS — M810 Age-related osteoporosis without current pathological fracture: Secondary | ICD-10-CM | POA: Diagnosis not present

## 2020-08-24 DIAGNOSIS — G894 Chronic pain syndrome: Secondary | ICD-10-CM | POA: Diagnosis not present

## 2020-08-24 DIAGNOSIS — G8929 Other chronic pain: Secondary | ICD-10-CM | POA: Diagnosis not present

## 2020-08-24 DIAGNOSIS — U071 COVID-19: Secondary | ICD-10-CM | POA: Diagnosis not present

## 2020-08-24 DIAGNOSIS — K219 Gastro-esophageal reflux disease without esophagitis: Secondary | ICD-10-CM | POA: Diagnosis not present

## 2020-08-24 DIAGNOSIS — M25671 Stiffness of right ankle, not elsewhere classified: Secondary | ICD-10-CM | POA: Diagnosis not present

## 2020-08-24 DIAGNOSIS — I10 Essential (primary) hypertension: Secondary | ICD-10-CM | POA: Diagnosis not present

## 2020-08-24 DIAGNOSIS — M6281 Muscle weakness (generalized): Secondary | ICD-10-CM | POA: Diagnosis not present

## 2020-08-24 DIAGNOSIS — E785 Hyperlipidemia, unspecified: Secondary | ICD-10-CM | POA: Diagnosis not present

## 2020-08-24 NOTE — Telephone Encounter (Addendum)
Patient called the office for results of her last  AAA Duplex ultrasound on 05/26/20.  I asked her if anyone had gone over the results with her previously.  She states she couldn't remember.  I read her the office visit note about the ultrasound from that day.  Patient verbalized understanding and said she would write it down and call us back for a follow up ultrasound in October if we do not call her.

## 2020-08-25 DIAGNOSIS — I10 Essential (primary) hypertension: Secondary | ICD-10-CM | POA: Diagnosis not present

## 2020-08-25 DIAGNOSIS — G894 Chronic pain syndrome: Secondary | ICD-10-CM | POA: Diagnosis not present

## 2020-08-25 DIAGNOSIS — U071 COVID-19: Secondary | ICD-10-CM | POA: Diagnosis not present

## 2020-08-25 DIAGNOSIS — J449 Chronic obstructive pulmonary disease, unspecified: Secondary | ICD-10-CM | POA: Diagnosis not present

## 2020-08-25 DIAGNOSIS — E119 Type 2 diabetes mellitus without complications: Secondary | ICD-10-CM | POA: Diagnosis not present

## 2020-08-27 DIAGNOSIS — M25571 Pain in right ankle and joints of right foot: Secondary | ICD-10-CM | POA: Diagnosis not present

## 2020-08-30 DIAGNOSIS — U071 COVID-19: Secondary | ICD-10-CM | POA: Diagnosis not present

## 2020-08-30 DIAGNOSIS — J449 Chronic obstructive pulmonary disease, unspecified: Secondary | ICD-10-CM | POA: Diagnosis not present

## 2020-08-30 DIAGNOSIS — M810 Age-related osteoporosis without current pathological fracture: Secondary | ICD-10-CM | POA: Diagnosis not present

## 2020-08-30 DIAGNOSIS — M47816 Spondylosis without myelopathy or radiculopathy, lumbar region: Secondary | ICD-10-CM | POA: Diagnosis not present

## 2020-08-30 DIAGNOSIS — E119 Type 2 diabetes mellitus without complications: Secondary | ICD-10-CM | POA: Diagnosis not present

## 2020-08-30 DIAGNOSIS — G894 Chronic pain syndrome: Secondary | ICD-10-CM | POA: Diagnosis not present

## 2020-08-30 DIAGNOSIS — I10 Essential (primary) hypertension: Secondary | ICD-10-CM | POA: Diagnosis not present

## 2020-08-30 DIAGNOSIS — M6281 Muscle weakness (generalized): Secondary | ICD-10-CM | POA: Diagnosis not present

## 2020-08-31 DIAGNOSIS — I10 Essential (primary) hypertension: Secondary | ICD-10-CM | POA: Diagnosis not present

## 2020-08-31 DIAGNOSIS — E038 Other specified hypothyroidism: Secondary | ICD-10-CM | POA: Diagnosis not present

## 2020-08-31 DIAGNOSIS — M6281 Muscle weakness (generalized): Secondary | ICD-10-CM | POA: Diagnosis not present

## 2020-08-31 DIAGNOSIS — U071 COVID-19: Secondary | ICD-10-CM | POA: Diagnosis not present

## 2020-08-31 DIAGNOSIS — M47816 Spondylosis without myelopathy or radiculopathy, lumbar region: Secondary | ICD-10-CM | POA: Diagnosis not present

## 2020-08-31 DIAGNOSIS — G894 Chronic pain syndrome: Secondary | ICD-10-CM | POA: Diagnosis not present

## 2020-08-31 DIAGNOSIS — D518 Other vitamin B12 deficiency anemias: Secondary | ICD-10-CM | POA: Diagnosis not present

## 2020-08-31 DIAGNOSIS — M810 Age-related osteoporosis without current pathological fracture: Secondary | ICD-10-CM | POA: Diagnosis not present

## 2020-08-31 DIAGNOSIS — E559 Vitamin D deficiency, unspecified: Secondary | ICD-10-CM | POA: Diagnosis not present

## 2020-08-31 DIAGNOSIS — E785 Hyperlipidemia, unspecified: Secondary | ICD-10-CM | POA: Diagnosis not present

## 2020-08-31 DIAGNOSIS — G309 Alzheimer's disease, unspecified: Secondary | ICD-10-CM | POA: Diagnosis not present

## 2020-08-31 DIAGNOSIS — E119 Type 2 diabetes mellitus without complications: Secondary | ICD-10-CM | POA: Diagnosis not present

## 2020-08-31 DIAGNOSIS — J449 Chronic obstructive pulmonary disease, unspecified: Secondary | ICD-10-CM | POA: Diagnosis not present

## 2020-09-03 DIAGNOSIS — U071 COVID-19: Secondary | ICD-10-CM | POA: Diagnosis not present

## 2020-09-03 DIAGNOSIS — M6281 Muscle weakness (generalized): Secondary | ICD-10-CM | POA: Diagnosis not present

## 2020-09-03 DIAGNOSIS — E119 Type 2 diabetes mellitus without complications: Secondary | ICD-10-CM | POA: Diagnosis not present

## 2020-09-03 DIAGNOSIS — M810 Age-related osteoporosis without current pathological fracture: Secondary | ICD-10-CM | POA: Diagnosis not present

## 2020-09-03 DIAGNOSIS — I10 Essential (primary) hypertension: Secondary | ICD-10-CM | POA: Diagnosis not present

## 2020-09-03 DIAGNOSIS — M47816 Spondylosis without myelopathy or radiculopathy, lumbar region: Secondary | ICD-10-CM | POA: Diagnosis not present

## 2020-09-03 DIAGNOSIS — J449 Chronic obstructive pulmonary disease, unspecified: Secondary | ICD-10-CM | POA: Diagnosis not present

## 2020-09-03 DIAGNOSIS — G894 Chronic pain syndrome: Secondary | ICD-10-CM | POA: Diagnosis not present

## 2020-09-06 DIAGNOSIS — U071 COVID-19: Secondary | ICD-10-CM | POA: Diagnosis not present

## 2020-09-06 DIAGNOSIS — M810 Age-related osteoporosis without current pathological fracture: Secondary | ICD-10-CM | POA: Diagnosis not present

## 2020-09-06 DIAGNOSIS — J449 Chronic obstructive pulmonary disease, unspecified: Secondary | ICD-10-CM | POA: Diagnosis not present

## 2020-09-06 DIAGNOSIS — M47816 Spondylosis without myelopathy or radiculopathy, lumbar region: Secondary | ICD-10-CM | POA: Diagnosis not present

## 2020-09-06 DIAGNOSIS — I10 Essential (primary) hypertension: Secondary | ICD-10-CM | POA: Diagnosis not present

## 2020-09-06 DIAGNOSIS — M6281 Muscle weakness (generalized): Secondary | ICD-10-CM | POA: Diagnosis not present

## 2020-09-06 DIAGNOSIS — G894 Chronic pain syndrome: Secondary | ICD-10-CM | POA: Diagnosis not present

## 2020-09-06 DIAGNOSIS — E119 Type 2 diabetes mellitus without complications: Secondary | ICD-10-CM | POA: Diagnosis not present

## 2020-09-10 DIAGNOSIS — M6281 Muscle weakness (generalized): Secondary | ICD-10-CM | POA: Diagnosis not present

## 2020-09-10 DIAGNOSIS — U071 COVID-19: Secondary | ICD-10-CM | POA: Diagnosis not present

## 2020-09-10 DIAGNOSIS — G894 Chronic pain syndrome: Secondary | ICD-10-CM | POA: Diagnosis not present

## 2020-09-10 DIAGNOSIS — E119 Type 2 diabetes mellitus without complications: Secondary | ICD-10-CM | POA: Diagnosis not present

## 2020-09-10 DIAGNOSIS — J449 Chronic obstructive pulmonary disease, unspecified: Secondary | ICD-10-CM | POA: Diagnosis not present

## 2020-09-10 DIAGNOSIS — M47816 Spondylosis without myelopathy or radiculopathy, lumbar region: Secondary | ICD-10-CM | POA: Diagnosis not present

## 2020-09-10 DIAGNOSIS — I10 Essential (primary) hypertension: Secondary | ICD-10-CM | POA: Diagnosis not present

## 2020-09-10 DIAGNOSIS — M810 Age-related osteoporosis without current pathological fracture: Secondary | ICD-10-CM | POA: Diagnosis not present

## 2020-09-13 DIAGNOSIS — M47816 Spondylosis without myelopathy or radiculopathy, lumbar region: Secondary | ICD-10-CM | POA: Diagnosis not present

## 2020-09-13 DIAGNOSIS — E119 Type 2 diabetes mellitus without complications: Secondary | ICD-10-CM | POA: Diagnosis not present

## 2020-09-13 DIAGNOSIS — U071 COVID-19: Secondary | ICD-10-CM | POA: Diagnosis not present

## 2020-09-13 DIAGNOSIS — G894 Chronic pain syndrome: Secondary | ICD-10-CM | POA: Diagnosis not present

## 2020-09-13 DIAGNOSIS — M810 Age-related osteoporosis without current pathological fracture: Secondary | ICD-10-CM | POA: Diagnosis not present

## 2020-09-13 DIAGNOSIS — I10 Essential (primary) hypertension: Secondary | ICD-10-CM | POA: Diagnosis not present

## 2020-09-13 DIAGNOSIS — M6281 Muscle weakness (generalized): Secondary | ICD-10-CM | POA: Diagnosis not present

## 2020-09-13 DIAGNOSIS — J449 Chronic obstructive pulmonary disease, unspecified: Secondary | ICD-10-CM | POA: Diagnosis not present

## 2020-09-14 ENCOUNTER — Other Ambulatory Visit: Payer: Self-pay

## 2020-09-14 ENCOUNTER — Ambulatory Visit
Admission: RE | Admit: 2020-09-14 | Discharge: 2020-09-14 | Disposition: A | Discharge status: Home / Self Care | Payer: Medicare Other | Source: Ambulatory Visit | Attending: Acute Care | Admitting: Acute Care

## 2020-09-14 DIAGNOSIS — Z87891 Personal history of nicotine dependence: Secondary | ICD-10-CM | POA: Insufficient documentation

## 2020-09-14 DIAGNOSIS — F17210 Nicotine dependence, cigarettes, uncomplicated: Secondary | ICD-10-CM | POA: Insufficient documentation

## 2020-09-15 DIAGNOSIS — U071 COVID-19: Secondary | ICD-10-CM | POA: Diagnosis not present

## 2020-09-15 DIAGNOSIS — M810 Age-related osteoporosis without current pathological fracture: Secondary | ICD-10-CM | POA: Diagnosis not present

## 2020-09-15 DIAGNOSIS — M47816 Spondylosis without myelopathy or radiculopathy, lumbar region: Secondary | ICD-10-CM | POA: Diagnosis not present

## 2020-09-15 DIAGNOSIS — M6281 Muscle weakness (generalized): Secondary | ICD-10-CM | POA: Diagnosis not present

## 2020-09-15 DIAGNOSIS — E119 Type 2 diabetes mellitus without complications: Secondary | ICD-10-CM | POA: Diagnosis not present

## 2020-09-15 DIAGNOSIS — J449 Chronic obstructive pulmonary disease, unspecified: Secondary | ICD-10-CM | POA: Diagnosis not present

## 2020-09-15 DIAGNOSIS — G894 Chronic pain syndrome: Secondary | ICD-10-CM | POA: Diagnosis not present

## 2020-09-15 DIAGNOSIS — I10 Essential (primary) hypertension: Secondary | ICD-10-CM | POA: Diagnosis not present

## 2020-09-21 DIAGNOSIS — R000 Tachycardia, unspecified: Secondary | ICD-10-CM | POA: Diagnosis not present

## 2020-09-21 DIAGNOSIS — M810 Age-related osteoporosis without current pathological fracture: Secondary | ICD-10-CM | POA: Diagnosis not present

## 2020-09-21 DIAGNOSIS — K5900 Constipation, unspecified: Secondary | ICD-10-CM | POA: Diagnosis not present

## 2020-09-21 DIAGNOSIS — E785 Hyperlipidemia, unspecified: Secondary | ICD-10-CM | POA: Diagnosis not present

## 2020-09-21 DIAGNOSIS — G309 Alzheimer's disease, unspecified: Secondary | ICD-10-CM | POA: Diagnosis not present

## 2020-09-21 DIAGNOSIS — E119 Type 2 diabetes mellitus without complications: Secondary | ICD-10-CM | POA: Diagnosis not present

## 2020-09-21 DIAGNOSIS — J449 Chronic obstructive pulmonary disease, unspecified: Secondary | ICD-10-CM | POA: Diagnosis not present

## 2020-09-21 DIAGNOSIS — M5136 Other intervertebral disc degeneration, lumbar region: Secondary | ICD-10-CM | POA: Diagnosis not present

## 2020-09-21 DIAGNOSIS — I10 Essential (primary) hypertension: Secondary | ICD-10-CM | POA: Diagnosis not present

## 2020-09-22 DIAGNOSIS — G894 Chronic pain syndrome: Secondary | ICD-10-CM | POA: Diagnosis not present

## 2020-09-22 DIAGNOSIS — M47816 Spondylosis without myelopathy or radiculopathy, lumbar region: Secondary | ICD-10-CM | POA: Diagnosis not present

## 2020-09-22 DIAGNOSIS — E119 Type 2 diabetes mellitus without complications: Secondary | ICD-10-CM | POA: Diagnosis not present

## 2020-09-22 DIAGNOSIS — J449 Chronic obstructive pulmonary disease, unspecified: Secondary | ICD-10-CM | POA: Diagnosis not present

## 2020-09-22 DIAGNOSIS — M810 Age-related osteoporosis without current pathological fracture: Secondary | ICD-10-CM | POA: Diagnosis not present

## 2020-09-22 DIAGNOSIS — I10 Essential (primary) hypertension: Secondary | ICD-10-CM | POA: Diagnosis not present

## 2020-09-22 DIAGNOSIS — U071 COVID-19: Secondary | ICD-10-CM | POA: Diagnosis not present

## 2020-09-22 DIAGNOSIS — M6281 Muscle weakness (generalized): Secondary | ICD-10-CM | POA: Diagnosis not present

## 2020-09-23 DIAGNOSIS — M6281 Muscle weakness (generalized): Secondary | ICD-10-CM | POA: Diagnosis not present

## 2020-09-23 DIAGNOSIS — G894 Chronic pain syndrome: Secondary | ICD-10-CM | POA: Diagnosis not present

## 2020-09-23 DIAGNOSIS — J449 Chronic obstructive pulmonary disease, unspecified: Secondary | ICD-10-CM | POA: Diagnosis not present

## 2020-09-23 DIAGNOSIS — D518 Other vitamin B12 deficiency anemias: Secondary | ICD-10-CM | POA: Diagnosis not present

## 2020-09-23 DIAGNOSIS — M810 Age-related osteoporosis without current pathological fracture: Secondary | ICD-10-CM | POA: Diagnosis not present

## 2020-09-23 DIAGNOSIS — E119 Type 2 diabetes mellitus without complications: Secondary | ICD-10-CM | POA: Diagnosis not present

## 2020-09-23 DIAGNOSIS — U071 COVID-19: Secondary | ICD-10-CM | POA: Diagnosis not present

## 2020-09-23 DIAGNOSIS — Z79899 Other long term (current) drug therapy: Secondary | ICD-10-CM | POA: Diagnosis not present

## 2020-09-23 DIAGNOSIS — R079 Chest pain, unspecified: Secondary | ICD-10-CM | POA: Diagnosis not present

## 2020-09-23 DIAGNOSIS — E7849 Other hyperlipidemia: Secondary | ICD-10-CM | POA: Diagnosis not present

## 2020-09-23 DIAGNOSIS — M47816 Spondylosis without myelopathy or radiculopathy, lumbar region: Secondary | ICD-10-CM | POA: Diagnosis not present

## 2020-09-23 DIAGNOSIS — I10 Essential (primary) hypertension: Secondary | ICD-10-CM | POA: Diagnosis not present

## 2020-09-26 DIAGNOSIS — M47816 Spondylosis without myelopathy or radiculopathy, lumbar region: Secondary | ICD-10-CM | POA: Diagnosis not present

## 2020-09-26 DIAGNOSIS — G894 Chronic pain syndrome: Secondary | ICD-10-CM | POA: Diagnosis not present

## 2020-09-26 DIAGNOSIS — U071 COVID-19: Secondary | ICD-10-CM | POA: Diagnosis not present

## 2020-09-26 DIAGNOSIS — M6281 Muscle weakness (generalized): Secondary | ICD-10-CM | POA: Diagnosis not present

## 2020-09-26 DIAGNOSIS — E119 Type 2 diabetes mellitus without complications: Secondary | ICD-10-CM | POA: Diagnosis not present

## 2020-09-26 DIAGNOSIS — I10 Essential (primary) hypertension: Secondary | ICD-10-CM | POA: Diagnosis not present

## 2020-09-26 DIAGNOSIS — J449 Chronic obstructive pulmonary disease, unspecified: Secondary | ICD-10-CM | POA: Diagnosis not present

## 2020-09-26 DIAGNOSIS — M810 Age-related osteoporosis without current pathological fracture: Secondary | ICD-10-CM | POA: Diagnosis not present

## 2020-09-28 DIAGNOSIS — M6281 Muscle weakness (generalized): Secondary | ICD-10-CM | POA: Diagnosis not present

## 2020-09-28 DIAGNOSIS — M810 Age-related osteoporosis without current pathological fracture: Secondary | ICD-10-CM | POA: Diagnosis not present

## 2020-09-28 DIAGNOSIS — G894 Chronic pain syndrome: Secondary | ICD-10-CM | POA: Diagnosis not present

## 2020-09-28 DIAGNOSIS — J449 Chronic obstructive pulmonary disease, unspecified: Secondary | ICD-10-CM | POA: Diagnosis not present

## 2020-09-28 DIAGNOSIS — M47816 Spondylosis without myelopathy or radiculopathy, lumbar region: Secondary | ICD-10-CM | POA: Diagnosis not present

## 2020-09-28 DIAGNOSIS — E119 Type 2 diabetes mellitus without complications: Secondary | ICD-10-CM | POA: Diagnosis not present

## 2020-09-28 DIAGNOSIS — U071 COVID-19: Secondary | ICD-10-CM | POA: Diagnosis not present

## 2020-09-28 DIAGNOSIS — I10 Essential (primary) hypertension: Secondary | ICD-10-CM | POA: Diagnosis not present

## 2020-09-28 NOTE — Progress Notes (Signed)
Please call patient and let them  know their  low dose Ct was read as a Lung RADS 2: nodules that are benign in appearance and behavior with a very low likelihood of becoming a clinically active cancer due to size or lack of growth. Recommendation per radiology is for a repeat LDCT in 12 months. .Please let them  know we will order and schedule their  annual screening scan for 08/2021. Please let them  know there was notation of CAD on their  scan.  Please remind the patient  that this is a non-gated exam therefore degree or severity of disease  cannot be determined. Please have them  follow up with their PCP regarding potential risk factor modification, dietary therapy or pharmacologic therapy if clinically indicated. Pt.  is  currently on statin therapy. Please place order for annual  screening scan for  08/2021 and fax results to PCP. Thanks so much.  + CAD >> Atherosclerotic calcification of the aorta, aortic valve and coronary arteries>> She is on statin therapy, I do not see cards notes. Have her follow up with her PCP. Thanks so much

## 2020-09-29 ENCOUNTER — Other Ambulatory Visit: Payer: Self-pay | Admitting: *Deleted

## 2020-09-29 DIAGNOSIS — F17210 Nicotine dependence, cigarettes, uncomplicated: Secondary | ICD-10-CM

## 2020-09-29 DIAGNOSIS — Z87891 Personal history of nicotine dependence: Secondary | ICD-10-CM

## 2020-10-01 DIAGNOSIS — E559 Vitamin D deficiency, unspecified: Secondary | ICD-10-CM | POA: Diagnosis not present

## 2020-10-01 DIAGNOSIS — Z79899 Other long term (current) drug therapy: Secondary | ICD-10-CM | POA: Diagnosis not present

## 2020-10-01 DIAGNOSIS — D518 Other vitamin B12 deficiency anemias: Secondary | ICD-10-CM | POA: Diagnosis not present

## 2020-10-01 DIAGNOSIS — E7849 Other hyperlipidemia: Secondary | ICD-10-CM | POA: Diagnosis not present

## 2020-10-01 DIAGNOSIS — E038 Other specified hypothyroidism: Secondary | ICD-10-CM | POA: Diagnosis not present

## 2020-10-01 DIAGNOSIS — E119 Type 2 diabetes mellitus without complications: Secondary | ICD-10-CM | POA: Diagnosis not present

## 2020-10-04 DIAGNOSIS — I10 Essential (primary) hypertension: Secondary | ICD-10-CM | POA: Diagnosis not present

## 2020-10-04 DIAGNOSIS — E119 Type 2 diabetes mellitus without complications: Secondary | ICD-10-CM | POA: Diagnosis not present

## 2020-10-04 DIAGNOSIS — D518 Other vitamin B12 deficiency anemias: Secondary | ICD-10-CM | POA: Diagnosis not present

## 2020-10-04 DIAGNOSIS — M810 Age-related osteoporosis without current pathological fracture: Secondary | ICD-10-CM | POA: Diagnosis not present

## 2020-10-04 DIAGNOSIS — G309 Alzheimer's disease, unspecified: Secondary | ICD-10-CM | POA: Diagnosis not present

## 2020-10-04 DIAGNOSIS — E785 Hyperlipidemia, unspecified: Secondary | ICD-10-CM | POA: Diagnosis not present

## 2020-10-04 DIAGNOSIS — J449 Chronic obstructive pulmonary disease, unspecified: Secondary | ICD-10-CM | POA: Diagnosis not present

## 2020-10-04 DIAGNOSIS — E038 Other specified hypothyroidism: Secondary | ICD-10-CM | POA: Diagnosis not present

## 2020-10-04 DIAGNOSIS — E559 Vitamin D deficiency, unspecified: Secondary | ICD-10-CM | POA: Diagnosis not present

## 2020-10-05 DIAGNOSIS — D518 Other vitamin B12 deficiency anemias: Secondary | ICD-10-CM | POA: Diagnosis not present

## 2020-10-05 DIAGNOSIS — Z79899 Other long term (current) drug therapy: Secondary | ICD-10-CM | POA: Diagnosis not present

## 2020-10-05 DIAGNOSIS — E038 Other specified hypothyroidism: Secondary | ICD-10-CM | POA: Diagnosis not present

## 2020-10-05 DIAGNOSIS — E559 Vitamin D deficiency, unspecified: Secondary | ICD-10-CM | POA: Diagnosis not present

## 2020-10-05 DIAGNOSIS — E119 Type 2 diabetes mellitus without complications: Secondary | ICD-10-CM | POA: Diagnosis not present

## 2020-10-05 DIAGNOSIS — E7849 Other hyperlipidemia: Secondary | ICD-10-CM | POA: Diagnosis not present

## 2020-10-06 DIAGNOSIS — M2041 Other hammer toe(s) (acquired), right foot: Secondary | ICD-10-CM | POA: Diagnosis not present

## 2020-10-06 DIAGNOSIS — M2042 Other hammer toe(s) (acquired), left foot: Secondary | ICD-10-CM | POA: Diagnosis not present

## 2020-10-06 DIAGNOSIS — L603 Nail dystrophy: Secondary | ICD-10-CM | POA: Diagnosis not present

## 2020-10-07 DIAGNOSIS — M810 Age-related osteoporosis without current pathological fracture: Secondary | ICD-10-CM | POA: Diagnosis not present

## 2020-10-07 DIAGNOSIS — R1312 Dysphagia, oropharyngeal phase: Secondary | ICD-10-CM | POA: Diagnosis not present

## 2020-10-07 DIAGNOSIS — I10 Essential (primary) hypertension: Secondary | ICD-10-CM | POA: Diagnosis not present

## 2020-10-07 DIAGNOSIS — E119 Type 2 diabetes mellitus without complications: Secondary | ICD-10-CM | POA: Diagnosis not present

## 2020-10-07 DIAGNOSIS — M6281 Muscle weakness (generalized): Secondary | ICD-10-CM | POA: Diagnosis not present

## 2020-10-07 DIAGNOSIS — J449 Chronic obstructive pulmonary disease, unspecified: Secondary | ICD-10-CM | POA: Diagnosis not present

## 2020-10-07 DIAGNOSIS — G894 Chronic pain syndrome: Secondary | ICD-10-CM | POA: Diagnosis not present

## 2020-10-08 ENCOUNTER — Other Ambulatory Visit: Payer: Self-pay | Admitting: Adult Health

## 2020-10-08 DIAGNOSIS — R1310 Dysphagia, unspecified: Secondary | ICD-10-CM

## 2020-10-13 DIAGNOSIS — M810 Age-related osteoporosis without current pathological fracture: Secondary | ICD-10-CM | POA: Diagnosis not present

## 2020-10-13 DIAGNOSIS — E119 Type 2 diabetes mellitus without complications: Secondary | ICD-10-CM | POA: Diagnosis not present

## 2020-10-13 DIAGNOSIS — R1312 Dysphagia, oropharyngeal phase: Secondary | ICD-10-CM | POA: Diagnosis not present

## 2020-10-13 DIAGNOSIS — G894 Chronic pain syndrome: Secondary | ICD-10-CM | POA: Diagnosis not present

## 2020-10-13 DIAGNOSIS — I10 Essential (primary) hypertension: Secondary | ICD-10-CM | POA: Diagnosis not present

## 2020-10-13 DIAGNOSIS — J449 Chronic obstructive pulmonary disease, unspecified: Secondary | ICD-10-CM | POA: Diagnosis not present

## 2020-10-13 DIAGNOSIS — M6281 Muscle weakness (generalized): Secondary | ICD-10-CM | POA: Diagnosis not present

## 2020-10-14 ENCOUNTER — Ambulatory Visit
Admission: RE | Admit: 2020-10-14 | Discharge: 2020-10-14 | Disposition: A | Discharge status: Home / Self Care | Payer: Medicare Other | Source: Ambulatory Visit | Attending: Adult Health | Admitting: Adult Health

## 2020-10-14 ENCOUNTER — Other Ambulatory Visit: Payer: Self-pay

## 2020-10-14 DIAGNOSIS — R1310 Dysphagia, unspecified: Secondary | ICD-10-CM | POA: Diagnosis not present

## 2020-10-14 DIAGNOSIS — R1319 Other dysphagia: Secondary | ICD-10-CM | POA: Insufficient documentation

## 2020-10-14 NOTE — Therapy (Addendum)
[place], [place], [address] Phone: [phone]   Fax:     Modified Barium Swallow  Patient Details  Name: Kylie Hurst MRN: [id number] Date of Birth: August 25, 1945 No data recorded  Encounter Date: 10/14/2020   End of Session - 10/14/20 1723     Visit Number 1    Number of Visits 1    Date for SLP Re-Evaluation 10/14/20    SLP Start Time 1320    SLP Stop Time  1420    SLP Time Calculation (min) 60 min    Activity Tolerance Patient tolerated treatment well             Past Medical History:  Diagnosis Date   Aortic aneurysm ([place]) 2011   3.46m   Arthritis    Back spasm    Bulging discs    Chronic back pain    Dementia ([facility]    Depression    Diabetes mellitus without complication ([facility]    fasting 90-110   Emphysema    Environmental and seasonal allergies    Hyperlipidemia    Hypertension    Neuromuscular disorder ([facility]    Onycholysis of toenail    Osteoporosis    Ovarian cyst 06/01/2009   5 cm   Restless legs    Spigelian hernia    left ventral   Wears glasses     Past Surgical History:  Procedure Laterality Date   ANTERIOR CERVICAL DECOMP/DISCECTOMY FUSION N/A 12/18/2012   Procedure: ANTERIOR CERVICAL DECOMPRESSION/DISCECTOMY FUSION 1 LEVEL;  Surgeon: MSinclair Ship MD;  [place] [facility]  Service: Orthopedics;  Laterality: N/A;  Anterior cervical decompression fusion, cervical 7 - thoracic 1 with instrumentation, allograft   COLONOSCOPY     INSERTION OF MESH Left 05/14/2014   Procedure: INSERTION OF MESH;  Surgeon: MDonnie Mesa MD;  [place] [facility]  Service: General;  Laterality: Left;   NECK SURGERY     SPIGELIAN HERNIA  05/14/2014   VENTRAL HERNIA REPAIR Left 05/14/2014   Procedure: ATTEMPTED LAPAROSCOPIC REPAIR LEFT SPIGLIAN HERINA CONVERTED TO OPEN REPAIR;  Surgeon: MDonnie Mesa MD;  [place] [facility]  Service: General;  Laterality: Left;    There were no  vitals filed for this visit.    Subjective: Patient behavior: (alertness, ability to follow instructions, etc.): pt quite talkative and easily engaged w/ Staff, SLP. Pt endorsed she had Mild Cognitive decline; Dementia dx noted per chart notes. She followed all instructions given min verbal cues, model. She does not have bottom Dentition which can impact effectiveness of mastication of solid foods b/f swallowing.  Chief complaint: dysphagia. Pt c/o a Globus feeling w/ solids, pills.   OM Exam: WFL. Cough - strong; Gag+ Upper Denture plate; no bottem dentition   Objective:  Radiological Procedure: A videoflouroscopic evaluation of oral-preparatory, reflex initiation, and pharyngeal phases of the swallow was performed; as well as a screening of the upper esophageal phase.  POSTURE: upright  VIEW: lateral COMPENSATORY STRATEGIES: none indicated BOLUSES ADMINISTERED:  Thin Liquid: 9 trials  Nectar-thick Liquid: 1 trial  Honey-thick Liquid: NT  Puree: 4 trials  Mechanical Soft: 4 trials RESULTS OF EVALUATION: ORAL PREPARATORY PHASE: (The lips, tongue, and velum are observed for strength and coordination)       **Overall Severity Rating: WFL.   SWALLOW INITIATION/REFLEX: (The reflex is normal if "triggered" by the time the bolus reached the base of the tongue)  **[facility]   PHARYNGEAL PHASE: (  Pharyngeal function is normal if the bolus shows rapid, smooth, and continuous transit through the pharynx and there is no pharyngeal residue after the swallow)  **Overall Severity Rating: [facility].   LARYNGEAL PENETRATION: (Material entering into the laryngeal inlet/vestibule but not aspirated): NONE  ASPIRATION: NONE ESOPHAGEAL PHASE: (Screening of the upper esophagus): c/o Esophageal dysmotility -- none seen in Cervical Esophagus. HOWEVER, a min+, finger-like tissue protrusion arose from the posterior Cervical Esophageal Wall during swallowing. It did not impede bolus motility; no  bolus residue remained in the area of it.   ASSESSMENT: Pt appears to present w/ No oropharyngeal phase dysphagia; No neuromuscular deficits of swallowing. NO aspiration nor penetration of po trials was noted to occur during this study. Pt exhibited a quite functional oropharyngeal swallow function. Of note, a min+ size, finger-like tissue protrusion arose from the posterior Cervical Esophageal Wall during swallowing. It did NOT impede bolus motility of the trials given at this exam; no bolus residue remained in the area above or below it. However, this protrusion of tissue occurring could be impact enough and related to the feeling pt has when eating more solid foods. Would recommend f/u w/ GI for further assessment if indicated. Also, any Reflux behavior could impact Esophageal Dysmotility.  During the oral phase, timely bolus management and control of bolus propulsion for A-P transfer occurred. Pt demonstrated min extra mastication effort/time (as instructed by SLP) to fully masticate/mash the soft solid trials before swallowing (d/t lack of Bottom Dentition). Oral clearing achieved w/ all trial consistencies. During the pharyngeal phase, Timely pharyngeal swallow initiation noted w/ all trial consistencies. NO aspiration or penetration occurred; airway closure appeared timely, tight. No pharyngeal residue remained post swallow indicating adequate laryngeal excursion and pharyngeal pressure during the swallow.  Discussed results of MBSS, video viewed and questions answered w/ pt and Caregiver present afterwards. Handout given on general recommendations.   PLAN/RECOMMENDATIONS:  A. Diet: Regular diet w/ Meats cut/chopped w/ Gravies added to moisten; Thin liquids. Pills WHOLE in a Puree if easier for swallowing (especially Larger pills)  B. Swallowing Precautions: general aspiration and Reflux precautions  C. Recommended consultation to f/u w/ GI for further Esophageal Dysmotility assessment/management  if needed  D. Therapy recommendations: NONE  E. Results and recommendations were discussed w/ pt, video viewed; education and recommendations discussed w/ Caregiver and pt post evaluation. Questions answered.       Esophageal dysphagia  Dysphagia, unspecified type - Plan: DG SWALLOW FUNC OP MEDICARE SPEECH PATH, DG SWALLOW FUNC OP MEDICARE SPEECH PATH        Problem List Patient Active Problem List   Diagnosis Date Noted   Acquired hammer toe of left foot 05/26/2020   Allergic rhinitis due to pollen 05/26/2020   Atherosclerosis 05/26/2020   Benign neoplasm of colon 05/26/2020   Chronic obstructive pulmonary disease, unspecified ([place]) 05/26/2020   Constipation 05/26/2020   Decreased estrogen level 05/26/2020   Hardening of the aorta (main artery of the heart) ([place]) 05/26/2020   Hypercalcemia 05/26/2020   Hyperlipidemia 05/26/2020   Hypertension 05/26/2020   Hypokalemia 05/26/2020   Osteoporosis 05/26/2020   Personal history of colonic polyps 05/26/2020   Rectocele 05/26/2020   Skin sensation disturbance 05/26/2020   Tobacco dependence 05/26/2020   Vaginal bleeding 05/26/2020   Pain due to onychomycosis of toenails of both feet 07/17/2018   Diabetes mellitus ([place]) 07/17/2018   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   Right lumbar radiculopathy 05/30/2013   Diabetic neuropathy ([place]) 05/30/2013   Cervical spondylosis without myelopathy  12/02/2012   Lumbosacral spondylosis without myelopathy 12/02/2012   Spondylolisthesis at L5-S1 level 12/02/2012   AAA (abdominal aortic aneurysm) without rupture ([place]) 07/03/2012         Orinda Kenner, MS, CCC-SLP Speech Language Pathologist Rehab Services [phone] [facility] 10/14/2020, 5:24 PM  [facility] [place], [place], [address] Phone: [phone]   Fax:     Name: KATORA FADDIS MRN: [id number] Date of Birth: [birth year]

## 2020-10-15 DIAGNOSIS — E119 Type 2 diabetes mellitus without complications: Secondary | ICD-10-CM | POA: Diagnosis not present

## 2020-10-15 DIAGNOSIS — R1312 Dysphagia, oropharyngeal phase: Secondary | ICD-10-CM | POA: Diagnosis not present

## 2020-10-15 DIAGNOSIS — M6281 Muscle weakness (generalized): Secondary | ICD-10-CM | POA: Diagnosis not present

## 2020-10-15 DIAGNOSIS — I10 Essential (primary) hypertension: Secondary | ICD-10-CM | POA: Diagnosis not present

## 2020-10-15 DIAGNOSIS — M810 Age-related osteoporosis without current pathological fracture: Secondary | ICD-10-CM | POA: Diagnosis not present

## 2020-10-15 DIAGNOSIS — J449 Chronic obstructive pulmonary disease, unspecified: Secondary | ICD-10-CM | POA: Diagnosis not present

## 2020-10-15 DIAGNOSIS — G894 Chronic pain syndrome: Secondary | ICD-10-CM | POA: Diagnosis not present

## 2020-10-19 DIAGNOSIS — K5900 Constipation, unspecified: Secondary | ICD-10-CM | POA: Diagnosis not present

## 2020-10-19 DIAGNOSIS — G8929 Other chronic pain: Secondary | ICD-10-CM | POA: Diagnosis not present

## 2020-10-19 DIAGNOSIS — E119 Type 2 diabetes mellitus without complications: Secondary | ICD-10-CM | POA: Diagnosis not present

## 2020-10-19 DIAGNOSIS — M810 Age-related osteoporosis without current pathological fracture: Secondary | ICD-10-CM | POA: Diagnosis not present

## 2020-10-19 DIAGNOSIS — R1310 Dysphagia, unspecified: Secondary | ICD-10-CM | POA: Diagnosis not present

## 2020-10-19 DIAGNOSIS — J449 Chronic obstructive pulmonary disease, unspecified: Secondary | ICD-10-CM | POA: Diagnosis not present

## 2020-10-19 DIAGNOSIS — M542 Cervicalgia: Secondary | ICD-10-CM | POA: Diagnosis not present

## 2020-10-19 DIAGNOSIS — M6281 Muscle weakness (generalized): Secondary | ICD-10-CM | POA: Diagnosis not present

## 2020-10-19 DIAGNOSIS — G894 Chronic pain syndrome: Secondary | ICD-10-CM | POA: Diagnosis not present

## 2020-10-19 DIAGNOSIS — G309 Alzheimer's disease, unspecified: Secondary | ICD-10-CM | POA: Diagnosis not present

## 2020-10-19 DIAGNOSIS — K219 Gastro-esophageal reflux disease without esophagitis: Secondary | ICD-10-CM | POA: Diagnosis not present

## 2020-10-19 DIAGNOSIS — I10 Essential (primary) hypertension: Secondary | ICD-10-CM | POA: Diagnosis not present

## 2020-10-19 DIAGNOSIS — M549 Dorsalgia, unspecified: Secondary | ICD-10-CM | POA: Diagnosis not present

## 2020-10-19 DIAGNOSIS — E785 Hyperlipidemia, unspecified: Secondary | ICD-10-CM | POA: Diagnosis not present

## 2020-10-19 DIAGNOSIS — R1312 Dysphagia, oropharyngeal phase: Secondary | ICD-10-CM | POA: Diagnosis not present

## 2020-10-25 DIAGNOSIS — R1312 Dysphagia, oropharyngeal phase: Secondary | ICD-10-CM | POA: Diagnosis not present

## 2020-10-25 DIAGNOSIS — E119 Type 2 diabetes mellitus without complications: Secondary | ICD-10-CM | POA: Diagnosis not present

## 2020-10-25 DIAGNOSIS — M810 Age-related osteoporosis without current pathological fracture: Secondary | ICD-10-CM | POA: Diagnosis not present

## 2020-10-25 DIAGNOSIS — J449 Chronic obstructive pulmonary disease, unspecified: Secondary | ICD-10-CM | POA: Diagnosis not present

## 2020-10-25 DIAGNOSIS — M6281 Muscle weakness (generalized): Secondary | ICD-10-CM | POA: Diagnosis not present

## 2020-10-25 DIAGNOSIS — I10 Essential (primary) hypertension: Secondary | ICD-10-CM | POA: Diagnosis not present

## 2020-10-25 DIAGNOSIS — G894 Chronic pain syndrome: Secondary | ICD-10-CM | POA: Diagnosis not present

## 2020-10-26 DIAGNOSIS — R1312 Dysphagia, oropharyngeal phase: Secondary | ICD-10-CM | POA: Diagnosis not present

## 2020-10-26 DIAGNOSIS — G894 Chronic pain syndrome: Secondary | ICD-10-CM | POA: Diagnosis not present

## 2020-10-26 DIAGNOSIS — M6281 Muscle weakness (generalized): Secondary | ICD-10-CM | POA: Diagnosis not present

## 2020-10-26 DIAGNOSIS — I10 Essential (primary) hypertension: Secondary | ICD-10-CM | POA: Diagnosis not present

## 2020-10-26 DIAGNOSIS — J449 Chronic obstructive pulmonary disease, unspecified: Secondary | ICD-10-CM | POA: Diagnosis not present

## 2020-10-26 DIAGNOSIS — M810 Age-related osteoporosis without current pathological fracture: Secondary | ICD-10-CM | POA: Diagnosis not present

## 2020-10-26 DIAGNOSIS — E119 Type 2 diabetes mellitus without complications: Secondary | ICD-10-CM | POA: Diagnosis not present

## 2020-10-27 DIAGNOSIS — I10 Essential (primary) hypertension: Secondary | ICD-10-CM | POA: Diagnosis not present

## 2020-10-27 DIAGNOSIS — R1312 Dysphagia, oropharyngeal phase: Secondary | ICD-10-CM | POA: Diagnosis not present

## 2020-10-27 DIAGNOSIS — M810 Age-related osteoporosis without current pathological fracture: Secondary | ICD-10-CM | POA: Diagnosis not present

## 2020-10-27 DIAGNOSIS — M6281 Muscle weakness (generalized): Secondary | ICD-10-CM | POA: Diagnosis not present

## 2020-10-27 DIAGNOSIS — G894 Chronic pain syndrome: Secondary | ICD-10-CM | POA: Diagnosis not present

## 2020-10-27 DIAGNOSIS — E119 Type 2 diabetes mellitus without complications: Secondary | ICD-10-CM | POA: Diagnosis not present

## 2020-10-27 DIAGNOSIS — J449 Chronic obstructive pulmonary disease, unspecified: Secondary | ICD-10-CM | POA: Diagnosis not present

## 2020-10-29 DIAGNOSIS — R1312 Dysphagia, oropharyngeal phase: Secondary | ICD-10-CM | POA: Diagnosis not present

## 2020-10-29 DIAGNOSIS — M545 Low back pain, unspecified: Secondary | ICD-10-CM | POA: Diagnosis not present

## 2020-10-29 DIAGNOSIS — G894 Chronic pain syndrome: Secondary | ICD-10-CM | POA: Diagnosis not present

## 2020-10-29 DIAGNOSIS — I10 Essential (primary) hypertension: Secondary | ICD-10-CM | POA: Diagnosis not present

## 2020-10-29 DIAGNOSIS — M6281 Muscle weakness (generalized): Secondary | ICD-10-CM | POA: Diagnosis not present

## 2020-10-29 DIAGNOSIS — J449 Chronic obstructive pulmonary disease, unspecified: Secondary | ICD-10-CM | POA: Diagnosis not present

## 2020-10-29 DIAGNOSIS — M546 Pain in thoracic spine: Secondary | ICD-10-CM | POA: Diagnosis not present

## 2020-10-29 DIAGNOSIS — E119 Type 2 diabetes mellitus without complications: Secondary | ICD-10-CM | POA: Diagnosis not present

## 2020-10-29 DIAGNOSIS — M810 Age-related osteoporosis without current pathological fracture: Secondary | ICD-10-CM | POA: Diagnosis not present

## 2020-10-29 DIAGNOSIS — M542 Cervicalgia: Secondary | ICD-10-CM | POA: Diagnosis not present

## 2020-11-01 DIAGNOSIS — M810 Age-related osteoporosis without current pathological fracture: Secondary | ICD-10-CM | POA: Diagnosis not present

## 2020-11-01 DIAGNOSIS — E119 Type 2 diabetes mellitus without complications: Secondary | ICD-10-CM | POA: Diagnosis not present

## 2020-11-01 DIAGNOSIS — G894 Chronic pain syndrome: Secondary | ICD-10-CM | POA: Diagnosis not present

## 2020-11-01 DIAGNOSIS — I10 Essential (primary) hypertension: Secondary | ICD-10-CM | POA: Diagnosis not present

## 2020-11-01 DIAGNOSIS — R1312 Dysphagia, oropharyngeal phase: Secondary | ICD-10-CM | POA: Diagnosis not present

## 2020-11-01 DIAGNOSIS — F03918 Unspecified dementia, unspecified severity, with other behavioral disturbance: Secondary | ICD-10-CM | POA: Diagnosis not present

## 2020-11-01 DIAGNOSIS — J449 Chronic obstructive pulmonary disease, unspecified: Secondary | ICD-10-CM | POA: Diagnosis not present

## 2020-11-01 DIAGNOSIS — M6281 Muscle weakness (generalized): Secondary | ICD-10-CM | POA: Diagnosis not present

## 2020-11-02 DIAGNOSIS — E785 Hyperlipidemia, unspecified: Secondary | ICD-10-CM | POA: Diagnosis not present

## 2020-11-02 DIAGNOSIS — D518 Other vitamin B12 deficiency anemias: Secondary | ICD-10-CM | POA: Diagnosis not present

## 2020-11-02 DIAGNOSIS — I10 Essential (primary) hypertension: Secondary | ICD-10-CM | POA: Diagnosis not present

## 2020-11-02 DIAGNOSIS — E119 Type 2 diabetes mellitus without complications: Secondary | ICD-10-CM | POA: Diagnosis not present

## 2020-11-02 DIAGNOSIS — G309 Alzheimer's disease, unspecified: Secondary | ICD-10-CM | POA: Diagnosis not present

## 2020-11-02 DIAGNOSIS — J449 Chronic obstructive pulmonary disease, unspecified: Secondary | ICD-10-CM | POA: Diagnosis not present

## 2020-11-02 DIAGNOSIS — M810 Age-related osteoporosis without current pathological fracture: Secondary | ICD-10-CM | POA: Diagnosis not present

## 2020-11-02 DIAGNOSIS — E559 Vitamin D deficiency, unspecified: Secondary | ICD-10-CM | POA: Diagnosis not present

## 2020-11-02 DIAGNOSIS — E038 Other specified hypothyroidism: Secondary | ICD-10-CM | POA: Diagnosis not present

## 2020-11-03 DIAGNOSIS — J449 Chronic obstructive pulmonary disease, unspecified: Secondary | ICD-10-CM | POA: Diagnosis not present

## 2020-11-03 DIAGNOSIS — E119 Type 2 diabetes mellitus without complications: Secondary | ICD-10-CM | POA: Diagnosis not present

## 2020-11-03 DIAGNOSIS — M810 Age-related osteoporosis without current pathological fracture: Secondary | ICD-10-CM | POA: Diagnosis not present

## 2020-11-03 DIAGNOSIS — I10 Essential (primary) hypertension: Secondary | ICD-10-CM | POA: Diagnosis not present

## 2020-11-03 DIAGNOSIS — R1312 Dysphagia, oropharyngeal phase: Secondary | ICD-10-CM | POA: Diagnosis not present

## 2020-11-03 DIAGNOSIS — G894 Chronic pain syndrome: Secondary | ICD-10-CM | POA: Diagnosis not present

## 2020-11-03 DIAGNOSIS — F03918 Unspecified dementia, unspecified severity, with other behavioral disturbance: Secondary | ICD-10-CM | POA: Diagnosis not present

## 2020-11-03 DIAGNOSIS — M6281 Muscle weakness (generalized): Secondary | ICD-10-CM | POA: Diagnosis not present

## 2020-11-05 DIAGNOSIS — E119 Type 2 diabetes mellitus without complications: Secondary | ICD-10-CM | POA: Diagnosis not present

## 2020-11-05 DIAGNOSIS — M810 Age-related osteoporosis without current pathological fracture: Secondary | ICD-10-CM | POA: Diagnosis not present

## 2020-11-05 DIAGNOSIS — I10 Essential (primary) hypertension: Secondary | ICD-10-CM | POA: Diagnosis not present

## 2020-11-05 DIAGNOSIS — F03918 Unspecified dementia, unspecified severity, with other behavioral disturbance: Secondary | ICD-10-CM | POA: Diagnosis not present

## 2020-11-05 DIAGNOSIS — M6281 Muscle weakness (generalized): Secondary | ICD-10-CM | POA: Diagnosis not present

## 2020-11-05 DIAGNOSIS — R1312 Dysphagia, oropharyngeal phase: Secondary | ICD-10-CM | POA: Diagnosis not present

## 2020-11-05 DIAGNOSIS — G894 Chronic pain syndrome: Secondary | ICD-10-CM | POA: Diagnosis not present

## 2020-11-05 DIAGNOSIS — J449 Chronic obstructive pulmonary disease, unspecified: Secondary | ICD-10-CM | POA: Diagnosis not present

## 2020-11-10 DIAGNOSIS — E119 Type 2 diabetes mellitus without complications: Secondary | ICD-10-CM | POA: Diagnosis not present

## 2020-11-10 DIAGNOSIS — G894 Chronic pain syndrome: Secondary | ICD-10-CM | POA: Diagnosis not present

## 2020-11-10 DIAGNOSIS — F03918 Unspecified dementia, unspecified severity, with other behavioral disturbance: Secondary | ICD-10-CM | POA: Diagnosis not present

## 2020-11-10 DIAGNOSIS — J449 Chronic obstructive pulmonary disease, unspecified: Secondary | ICD-10-CM | POA: Diagnosis not present

## 2020-11-10 DIAGNOSIS — M6281 Muscle weakness (generalized): Secondary | ICD-10-CM | POA: Diagnosis not present

## 2020-11-10 DIAGNOSIS — I10 Essential (primary) hypertension: Secondary | ICD-10-CM | POA: Diagnosis not present

## 2020-11-10 DIAGNOSIS — R1312 Dysphagia, oropharyngeal phase: Secondary | ICD-10-CM | POA: Diagnosis not present

## 2020-11-10 DIAGNOSIS — M810 Age-related osteoporosis without current pathological fracture: Secondary | ICD-10-CM | POA: Diagnosis not present

## 2020-11-12 DIAGNOSIS — M6281 Muscle weakness (generalized): Secondary | ICD-10-CM | POA: Diagnosis not present

## 2020-11-12 DIAGNOSIS — R1312 Dysphagia, oropharyngeal phase: Secondary | ICD-10-CM | POA: Diagnosis not present

## 2020-11-12 DIAGNOSIS — E119 Type 2 diabetes mellitus without complications: Secondary | ICD-10-CM | POA: Diagnosis not present

## 2020-11-12 DIAGNOSIS — M810 Age-related osteoporosis without current pathological fracture: Secondary | ICD-10-CM | POA: Diagnosis not present

## 2020-11-12 DIAGNOSIS — G894 Chronic pain syndrome: Secondary | ICD-10-CM | POA: Diagnosis not present

## 2020-11-12 DIAGNOSIS — F03918 Unspecified dementia, unspecified severity, with other behavioral disturbance: Secondary | ICD-10-CM | POA: Diagnosis not present

## 2020-11-12 DIAGNOSIS — I10 Essential (primary) hypertension: Secondary | ICD-10-CM | POA: Diagnosis not present

## 2020-11-12 DIAGNOSIS — J449 Chronic obstructive pulmonary disease, unspecified: Secondary | ICD-10-CM | POA: Diagnosis not present

## 2020-11-16 DIAGNOSIS — K5900 Constipation, unspecified: Secondary | ICD-10-CM | POA: Diagnosis not present

## 2020-11-16 DIAGNOSIS — I10 Essential (primary) hypertension: Secondary | ICD-10-CM | POA: Diagnosis not present

## 2020-11-16 DIAGNOSIS — E119 Type 2 diabetes mellitus without complications: Secondary | ICD-10-CM | POA: Diagnosis not present

## 2020-11-16 DIAGNOSIS — K219 Gastro-esophageal reflux disease without esophagitis: Secondary | ICD-10-CM | POA: Diagnosis not present

## 2020-11-16 DIAGNOSIS — M542 Cervicalgia: Secondary | ICD-10-CM | POA: Diagnosis not present

## 2020-11-16 DIAGNOSIS — R1312 Dysphagia, oropharyngeal phase: Secondary | ICD-10-CM | POA: Diagnosis not present

## 2020-11-16 DIAGNOSIS — M549 Dorsalgia, unspecified: Secondary | ICD-10-CM | POA: Diagnosis not present

## 2020-11-16 DIAGNOSIS — G8929 Other chronic pain: Secondary | ICD-10-CM | POA: Diagnosis not present

## 2020-11-16 DIAGNOSIS — J449 Chronic obstructive pulmonary disease, unspecified: Secondary | ICD-10-CM | POA: Diagnosis not present

## 2020-11-16 DIAGNOSIS — E785 Hyperlipidemia, unspecified: Secondary | ICD-10-CM | POA: Diagnosis not present

## 2020-11-16 DIAGNOSIS — M5136 Other intervertebral disc degeneration, lumbar region: Secondary | ICD-10-CM | POA: Diagnosis not present

## 2020-11-16 DIAGNOSIS — M810 Age-related osteoporosis without current pathological fracture: Secondary | ICD-10-CM | POA: Diagnosis not present

## 2020-11-16 DIAGNOSIS — F03918 Unspecified dementia, unspecified severity, with other behavioral disturbance: Secondary | ICD-10-CM | POA: Diagnosis not present

## 2020-11-16 DIAGNOSIS — G894 Chronic pain syndrome: Secondary | ICD-10-CM | POA: Diagnosis not present

## 2020-11-16 DIAGNOSIS — M6281 Muscle weakness (generalized): Secondary | ICD-10-CM | POA: Diagnosis not present

## 2020-11-16 DIAGNOSIS — R1310 Dysphagia, unspecified: Secondary | ICD-10-CM | POA: Diagnosis not present

## 2020-11-17 DIAGNOSIS — I10 Essential (primary) hypertension: Secondary | ICD-10-CM | POA: Diagnosis not present

## 2020-11-17 DIAGNOSIS — M810 Age-related osteoporosis without current pathological fracture: Secondary | ICD-10-CM | POA: Diagnosis not present

## 2020-11-17 DIAGNOSIS — M6281 Muscle weakness (generalized): Secondary | ICD-10-CM | POA: Diagnosis not present

## 2020-11-17 DIAGNOSIS — R1312 Dysphagia, oropharyngeal phase: Secondary | ICD-10-CM | POA: Diagnosis not present

## 2020-11-17 DIAGNOSIS — F03918 Unspecified dementia, unspecified severity, with other behavioral disturbance: Secondary | ICD-10-CM | POA: Diagnosis not present

## 2020-11-17 DIAGNOSIS — E119 Type 2 diabetes mellitus without complications: Secondary | ICD-10-CM | POA: Diagnosis not present

## 2020-11-17 DIAGNOSIS — J449 Chronic obstructive pulmonary disease, unspecified: Secondary | ICD-10-CM | POA: Diagnosis not present

## 2020-11-17 DIAGNOSIS — G894 Chronic pain syndrome: Secondary | ICD-10-CM | POA: Diagnosis not present

## 2020-11-18 DIAGNOSIS — F03918 Unspecified dementia, unspecified severity, with other behavioral disturbance: Secondary | ICD-10-CM | POA: Diagnosis not present

## 2020-11-18 DIAGNOSIS — M810 Age-related osteoporosis without current pathological fracture: Secondary | ICD-10-CM | POA: Diagnosis not present

## 2020-11-18 DIAGNOSIS — G894 Chronic pain syndrome: Secondary | ICD-10-CM | POA: Diagnosis not present

## 2020-11-18 DIAGNOSIS — I10 Essential (primary) hypertension: Secondary | ICD-10-CM | POA: Diagnosis not present

## 2020-11-18 DIAGNOSIS — E119 Type 2 diabetes mellitus without complications: Secondary | ICD-10-CM | POA: Diagnosis not present

## 2020-11-18 DIAGNOSIS — M6281 Muscle weakness (generalized): Secondary | ICD-10-CM | POA: Diagnosis not present

## 2020-11-18 DIAGNOSIS — R1312 Dysphagia, oropharyngeal phase: Secondary | ICD-10-CM | POA: Diagnosis not present

## 2020-11-18 DIAGNOSIS — J449 Chronic obstructive pulmonary disease, unspecified: Secondary | ICD-10-CM | POA: Diagnosis not present

## 2020-11-19 DIAGNOSIS — R300 Dysuria: Secondary | ICD-10-CM | POA: Diagnosis not present

## 2020-11-21 DIAGNOSIS — R1312 Dysphagia, oropharyngeal phase: Secondary | ICD-10-CM | POA: Diagnosis not present

## 2020-11-21 DIAGNOSIS — F03918 Unspecified dementia, unspecified severity, with other behavioral disturbance: Secondary | ICD-10-CM | POA: Diagnosis not present

## 2020-11-21 DIAGNOSIS — E119 Type 2 diabetes mellitus without complications: Secondary | ICD-10-CM | POA: Diagnosis not present

## 2020-11-21 DIAGNOSIS — G894 Chronic pain syndrome: Secondary | ICD-10-CM | POA: Diagnosis not present

## 2020-11-21 DIAGNOSIS — M810 Age-related osteoporosis without current pathological fracture: Secondary | ICD-10-CM | POA: Diagnosis not present

## 2020-11-21 DIAGNOSIS — M6281 Muscle weakness (generalized): Secondary | ICD-10-CM | POA: Diagnosis not present

## 2020-11-21 DIAGNOSIS — J449 Chronic obstructive pulmonary disease, unspecified: Secondary | ICD-10-CM | POA: Diagnosis not present

## 2020-11-21 DIAGNOSIS — I10 Essential (primary) hypertension: Secondary | ICD-10-CM | POA: Diagnosis not present

## 2020-11-22 DIAGNOSIS — Z79899 Other long term (current) drug therapy: Secondary | ICD-10-CM | POA: Diagnosis not present

## 2020-11-22 DIAGNOSIS — D518 Other vitamin B12 deficiency anemias: Secondary | ICD-10-CM | POA: Diagnosis not present

## 2020-11-22 DIAGNOSIS — E119 Type 2 diabetes mellitus without complications: Secondary | ICD-10-CM | POA: Diagnosis not present

## 2020-11-22 DIAGNOSIS — E7849 Other hyperlipidemia: Secondary | ICD-10-CM | POA: Diagnosis not present

## 2020-11-23 ENCOUNTER — Other Ambulatory Visit (HOSPITAL_COMMUNITY): Payer: Medicare Other

## 2020-11-23 ENCOUNTER — Ambulatory Visit: Payer: Medicare Other

## 2020-11-24 DIAGNOSIS — E119 Type 2 diabetes mellitus without complications: Secondary | ICD-10-CM | POA: Diagnosis not present

## 2020-11-24 DIAGNOSIS — J449 Chronic obstructive pulmonary disease, unspecified: Secondary | ICD-10-CM | POA: Diagnosis not present

## 2020-11-24 DIAGNOSIS — R1312 Dysphagia, oropharyngeal phase: Secondary | ICD-10-CM | POA: Diagnosis not present

## 2020-11-24 DIAGNOSIS — I10 Essential (primary) hypertension: Secondary | ICD-10-CM | POA: Diagnosis not present

## 2020-11-24 DIAGNOSIS — M810 Age-related osteoporosis without current pathological fracture: Secondary | ICD-10-CM | POA: Diagnosis not present

## 2020-11-24 DIAGNOSIS — G894 Chronic pain syndrome: Secondary | ICD-10-CM | POA: Diagnosis not present

## 2020-11-24 DIAGNOSIS — M6281 Muscle weakness (generalized): Secondary | ICD-10-CM | POA: Diagnosis not present

## 2020-11-24 DIAGNOSIS — F03918 Unspecified dementia, unspecified severity, with other behavioral disturbance: Secondary | ICD-10-CM | POA: Diagnosis not present

## 2020-11-30 DIAGNOSIS — M6281 Muscle weakness (generalized): Secondary | ICD-10-CM | POA: Diagnosis not present

## 2020-11-30 DIAGNOSIS — E559 Vitamin D deficiency, unspecified: Secondary | ICD-10-CM | POA: Diagnosis not present

## 2020-11-30 DIAGNOSIS — G894 Chronic pain syndrome: Secondary | ICD-10-CM | POA: Diagnosis not present

## 2020-11-30 DIAGNOSIS — M810 Age-related osteoporosis without current pathological fracture: Secondary | ICD-10-CM | POA: Diagnosis not present

## 2020-11-30 DIAGNOSIS — J449 Chronic obstructive pulmonary disease, unspecified: Secondary | ICD-10-CM | POA: Diagnosis not present

## 2020-11-30 DIAGNOSIS — E119 Type 2 diabetes mellitus without complications: Secondary | ICD-10-CM | POA: Diagnosis not present

## 2020-11-30 DIAGNOSIS — D518 Other vitamin B12 deficiency anemias: Secondary | ICD-10-CM | POA: Diagnosis not present

## 2020-11-30 DIAGNOSIS — F03918 Unspecified dementia, unspecified severity, with other behavioral disturbance: Secondary | ICD-10-CM | POA: Diagnosis not present

## 2020-11-30 DIAGNOSIS — R1312 Dysphagia, oropharyngeal phase: Secondary | ICD-10-CM | POA: Diagnosis not present

## 2020-11-30 DIAGNOSIS — G309 Alzheimer's disease, unspecified: Secondary | ICD-10-CM | POA: Diagnosis not present

## 2020-11-30 DIAGNOSIS — E038 Other specified hypothyroidism: Secondary | ICD-10-CM | POA: Diagnosis not present

## 2020-11-30 DIAGNOSIS — I10 Essential (primary) hypertension: Secondary | ICD-10-CM | POA: Diagnosis not present

## 2020-11-30 DIAGNOSIS — E785 Hyperlipidemia, unspecified: Secondary | ICD-10-CM | POA: Diagnosis not present

## 2020-12-02 ENCOUNTER — Ambulatory Visit (INDEPENDENT_AMBULATORY_CARE_PROVIDER_SITE_OTHER): Payer: Medicare Other | Admitting: Physician Assistant

## 2020-12-02 ENCOUNTER — Ambulatory Visit (HOSPITAL_COMMUNITY)
Admission: RE | Admit: 2020-12-02 | Discharge: 2020-12-02 | Disposition: A | Discharge status: Home / Self Care | Payer: Medicare Other | Source: Ambulatory Visit | Attending: Physician Assistant | Admitting: Physician Assistant

## 2020-12-02 ENCOUNTER — Other Ambulatory Visit: Payer: Self-pay

## 2020-12-02 ENCOUNTER — Encounter: Payer: Self-pay | Admitting: Physician Assistant

## 2020-12-02 VITALS — BP 114/68 | HR 70 | Temp 97.6°F | Resp 20 | Ht <= 58 in | Wt 111.2 lb

## 2020-12-02 DIAGNOSIS — J449 Chronic obstructive pulmonary disease, unspecified: Secondary | ICD-10-CM | POA: Diagnosis not present

## 2020-12-02 DIAGNOSIS — I714 Abdominal aortic aneurysm, without rupture, unspecified: Secondary | ICD-10-CM | POA: Diagnosis not present

## 2020-12-02 DIAGNOSIS — I10 Essential (primary) hypertension: Secondary | ICD-10-CM | POA: Diagnosis not present

## 2020-12-02 DIAGNOSIS — F03918 Unspecified dementia, unspecified severity, with other behavioral disturbance: Secondary | ICD-10-CM | POA: Diagnosis not present

## 2020-12-02 DIAGNOSIS — R1312 Dysphagia, oropharyngeal phase: Secondary | ICD-10-CM | POA: Diagnosis not present

## 2020-12-02 DIAGNOSIS — E119 Type 2 diabetes mellitus without complications: Secondary | ICD-10-CM | POA: Diagnosis not present

## 2020-12-02 DIAGNOSIS — G894 Chronic pain syndrome: Secondary | ICD-10-CM | POA: Diagnosis not present

## 2020-12-02 DIAGNOSIS — M6281 Muscle weakness (generalized): Secondary | ICD-10-CM | POA: Diagnosis not present

## 2020-12-02 DIAGNOSIS — M810 Age-related osteoporosis without current pathological fracture: Secondary | ICD-10-CM | POA: Diagnosis not present

## 2020-12-02 NOTE — Progress Notes (Signed)
[facility] HISTORY AND PHYSICAL   History of Present Illness:  Patient is a 75 y.o. year old female who presents for evaluation of abdominal aortic aneurysm.   The aneurysm is currently 4.65 cm in diameter by US/CT performed at [facility] on 11/07/19.  The patient denies abdominal/lumbar pain.  The patient denies back pain.                Compliant with aspirin and pravastatin.  She does continue to use tobacco daily.  Past Medical History:  Diagnosis Date   Aortic aneurysm ([place]) 2011   3.66mm   Arthritis    Back spasm    Bulging discs    Chronic back pain    Dementia ([facility])    Depression    Diabetes mellitus without complication ([facility])    fasting 90-110   Emphysema    Environmental and seasonal allergies    Hyperlipidemia    Hypertension    Neuromuscular disorder ([place])    Onycholysis of toenail    Osteoporosis    Ovarian cyst 06/01/2009   5 cm   Restless legs    Spigelian hernia    left ventral   Wears glasses     Past Surgical History:  Procedure Laterality Date   ANTERIOR CERVICAL DECOMP/DISCECTOMY FUSION N/A 12/18/2012   Procedure: ANTERIOR CERVICAL DECOMPRESSION/DISCECTOMY FUSION 1 LEVEL;  Surgeon: Sinclair Ship, MD;  Location: [facility];  Service: Orthopedics;  Laterality: N/A;  Anterior cervical decompression fusion, cervical 7 - thoracic 1 with instrumentation, allograft   COLONOSCOPY     INSERTION OF MESH Left 05/14/2014   Procedure: INSERTION OF MESH;  Surgeon: Donnie Mesa, MD;  Location: [facility];  Service: General;  Laterality: Left;   NECK SURGERY     SPIGELIAN HERNIA  05/14/2014   VENTRAL HERNIA REPAIR Left 05/14/2014   Procedure: ATTEMPTED LAPAROSCOPIC REPAIR LEFT SPIGLIAN HERINA CONVERTED TO OPEN REPAIR;  Surgeon: Donnie Mesa, MD;  Location: [facility];  Service: General;  Laterality: Left;     Social History Social History   Tobacco Use   Smoking status: Former    Packs/day: 1.00    Years: 52.00    Pack years: 52.00    Types:  Cigarettes   Smokeless tobacco: Never   Tobacco comments:    Currently on Chantix  Vaping Use   Vaping Use: Never used  Substance Use Topics   Alcohol use: No   Drug use: No    Family History Family History  Problem Relation Age of Onset   Diabetes Mother    Cancer Sister    Epilepsy Brother     Allergies  Allergies  Allergen Reactions   Other Itching and Other (See Comments)    Seasonal Allergies- Itchy eyes, runny nose, etc..     Current Outpatient Medications  Medication Sig Dispense Refill   ACCU-CHEK AVIVA PLUS test strip USE AS DIRECTED TO TEST BLOOD SUGAR DAILY     albuterol (VENTOLIN HFA) 108 (90 Base) MCG/ACT inhaler Inhale 1 puff into the lungs every 4 (four) hours as needed for shortness of breath or wheezing.     ANORO ELLIPTA 62.5-25 MCG/INH AEPB Inhale 1 puff into the lungs in the morning.     Ascorbic Acid (VITAMIN C) 1000 MG tablet Take 1,000 mg by mouth See admin instructions. Take 1,000 mg by mouth once a day in the Spring, Summer, and Fall- increase to 1,000 mg two times a day in the Winter  aspirin (ASPIRIN CHILDRENS) 81 MG chewable tablet Chew 1 tablet (81 mg total) by mouth daily. 30 tablet 0   b complex vitamins tablet Take 1 tablet by mouth daily.     BIOTIN PO Take 1,000 mg by mouth 2 (two) times daily.     Calcium Carbonate-Vitamin D 600-400 MG-UNIT per tablet Take 1 tablet by mouth daily.      cetirizine (ZYRTEC) 10 MG tablet Take 10 mg by mouth at bedtime.     denosumab (PROLIA) 60 MG/ML SOSY injection Inject 60 mg into the skin every 6 (six) months.     diclofenac Sodium (VOLTAREN) 1 % GEL Apply 4 g topically 4 (four) times daily. 100 g 0   fexofenadine (ALLEGRA) 180 MG tablet Take 180 mg by mouth daily as needed for rhinitis or allergies.     fluticasone (FLONASE) 50 MCG/ACT nasal spray Place 1-2 sprays into both nostrils daily.     guaiFENesin (MUCINEX) 600 MG 12 hr tablet Take 600 mg by mouth 2 (two) times daily.     hydrochlorothiazide  (MICROZIDE) 12.5 MG capsule Take 12.5 mg by mouth every evening.     KLOR-CON M20 20 MEQ tablet Take 20 mEq by mouth every evening.     lidocaine (LIDODERM) 5 % Place 1 patch onto the skin daily. Remove & Discard patch within 12 hours or as directed by MD 30 patch 0   lisinopril (PRINIVIL,ZESTRIL) 5 MG tablet Take 5 mg by mouth at bedtime.      Multiple Vitamins-Minerals (MULTIVITAMIN WITH MINERALS) tablet Take 1 tablet by mouth every evening.      Omega-3 Fatty Acids (FISH OIL) 1000 MG CAPS Take 1 capsule by mouth daily.     polyethylene glycol-electrolytes (NULYTELY) 420 g solution See admin instructions.     pravastatin (PRAVACHOL) 20 MG tablet Take 20 mg by mouth at bedtime.      SHINGRIX injection      tiZANidine (ZANAFLEX) 2 MG tablet Take 1 tablet (2 mg total) by mouth 2 (two) times daily. 180 tablet 0   traMADol (ULTRAM) 50 MG tablet Take 1 tablet (50 mg total) by mouth every 8 (eight) hours as needed. no more than 3 per day (Patient taking differently: Take 50 mg by mouth 3 (three) times daily.) 90 tablet 5   No current facility-administered medications for this visit.    ROS:   General:  No weight loss, Fever, chills  HEENT: No recent headaches, no nasal bleeding, no visual changes, no sore throat  Neurologic: No dizziness, blackouts, seizures. No recent symptoms of stroke or mini- stroke. No recent episodes of slurred speech, or temporary blindness.  Cardiac: No recent episodes of chest pain/pressure, no shortness of breath at rest.  No shortness of breath with exertion.  Denies history of atrial fibrillation or irregular heartbeat  Vascular: No history of rest pain in feet.  No history of claudication.  No history of non-healing ulcer, No history of DVT   Pulmonary: No home oxygen, no productive cough, no hemoptysis,  No asthma or wheezing  Musculoskeletal:  [ ]  Arthritis, [ ]  Low back pain,  [ ]  Joint pain  Hematologic:No history of hypercoagulable state.  No history of  easy bleeding.  No history of anemia  Gastrointestinal: No hematochezia or melena,  No gastroesophageal reflux, no trouble swallowing  Urinary: [ ]  chronic Kidney disease, [ ]  on HD - [ ]  MWF or [ ]  TTHS, [ ]  Burning with urination, [ ]  Frequent urination, [ ]   Difficulty urinating;   Skin: No rashes  Psychological: No history of anxiety,  No history of depression   Physical Examination  Vitals:   12/02/20 0948  BP: 114/68  Pulse: 70  Resp: 20  Temp: 97.6 F (36.4 C)  TempSrc: Temporal  SpO2: 95%  Weight: 111 lb 3.2 oz (50.4 kg)  Height: 4\' 9"  (1.448 m)    Body mass index is 24.06 kg/m.  General:  Alert and oriented, no acute distress HEENT: Normal Neck: No  JVD Pulmonary: non labored breathing Cardiac: Regular Rate and Rhythm without murmur Gastrointestinal: Soft, non-tender, non-distended, no mass, no scars Skin: No rash Extremity Pulses:  2+ radial, brachial, femoral, dorsalis pedis pulses bilaterally Musculoskeletal: No deformity or edema  Neurologic: Upper and lower extremity motor grossly and symmetric  DATA:     Abdominal Aorta Findings:  +-----------+-------+----------+----------+--------+--------+--------+  Location   AP (cm)Trans (cm)PSV (cm/s)WaveformThrombusComments  +-----------+-------+----------+----------+--------+--------+--------+  Proximal   1.88   2.13      83                                  +-----------+-------+----------+----------+--------+--------+--------+  Mid        2.00   1.65      58                                  +-----------+-------+----------+----------+--------+--------+--------+  Distal     4.26   4.28      34                                  +-----------+-------+----------+----------+--------+--------+--------+  RT CIA Prox1.1    1.1       85                                  +-----------+-------+----------+----------+--------+--------+--------+  LT CIA Prox1.3    1.3       114                                  +-----------+-------+----------+----------+--------+--------+--------+   Summary:  Abdominal Aorta: There is evidence of abnormal dilatation of the distal  Abdominal aorta.      ASSESSMENT/PLAN: Asymptomatic AAA Her f/u duplex in 4.28 cm today accentually unchanged, or reported improved which is likely user error.  She will f/u in 9 months for repeat AAA duplex.  If she has sudden abdominal or lumbar pain she will call 911.       Roxy Horseman PA-C [facility] Office: [phone]  MD on call Stanford Breed

## 2020-12-03 ENCOUNTER — Other Ambulatory Visit: Payer: Self-pay

## 2020-12-03 DIAGNOSIS — I714 Abdominal aortic aneurysm, without rupture, unspecified: Secondary | ICD-10-CM

## 2020-12-05 DIAGNOSIS — E119 Type 2 diabetes mellitus without complications: Secondary | ICD-10-CM | POA: Diagnosis not present

## 2020-12-05 DIAGNOSIS — G894 Chronic pain syndrome: Secondary | ICD-10-CM | POA: Diagnosis not present

## 2020-12-05 DIAGNOSIS — I10 Essential (primary) hypertension: Secondary | ICD-10-CM | POA: Diagnosis not present

## 2020-12-05 DIAGNOSIS — R1312 Dysphagia, oropharyngeal phase: Secondary | ICD-10-CM | POA: Diagnosis not present

## 2020-12-05 DIAGNOSIS — F03918 Unspecified dementia, unspecified severity, with other behavioral disturbance: Secondary | ICD-10-CM | POA: Diagnosis not present

## 2020-12-05 DIAGNOSIS — M810 Age-related osteoporosis without current pathological fracture: Secondary | ICD-10-CM | POA: Diagnosis not present

## 2020-12-05 DIAGNOSIS — J449 Chronic obstructive pulmonary disease, unspecified: Secondary | ICD-10-CM | POA: Diagnosis not present

## 2020-12-05 DIAGNOSIS — M6281 Muscle weakness (generalized): Secondary | ICD-10-CM | POA: Diagnosis not present

## 2020-12-07 ENCOUNTER — Emergency Department
Admission: EM | Admit: 2020-12-07 | Discharge: 2020-12-07 | Disposition: A | Discharge status: Home / Self Care | Payer: Medicare Other | Attending: Emergency Medicine | Admitting: Emergency Medicine

## 2020-12-07 ENCOUNTER — Other Ambulatory Visit: Payer: Self-pay

## 2020-12-07 DIAGNOSIS — F32A Depression, unspecified: Secondary | ICD-10-CM | POA: Diagnosis not present

## 2020-12-07 DIAGNOSIS — Z87891 Personal history of nicotine dependence: Secondary | ICD-10-CM | POA: Insufficient documentation

## 2020-12-07 DIAGNOSIS — J449 Chronic obstructive pulmonary disease, unspecified: Secondary | ICD-10-CM | POA: Insufficient documentation

## 2020-12-07 DIAGNOSIS — G309 Alzheimer's disease, unspecified: Secondary | ICD-10-CM | POA: Diagnosis not present

## 2020-12-07 DIAGNOSIS — E119 Type 2 diabetes mellitus without complications: Secondary | ICD-10-CM | POA: Diagnosis not present

## 2020-12-07 DIAGNOSIS — F0390 Unspecified dementia without behavioral disturbance: Secondary | ICD-10-CM

## 2020-12-07 DIAGNOSIS — Z79899 Other long term (current) drug therapy: Secondary | ICD-10-CM | POA: Diagnosis not present

## 2020-12-07 DIAGNOSIS — Z20822 Contact with and (suspected) exposure to covid-19: Secondary | ICD-10-CM | POA: Diagnosis not present

## 2020-12-07 DIAGNOSIS — R6889 Other general symptoms and signs: Secondary | ICD-10-CM | POA: Diagnosis not present

## 2020-12-07 DIAGNOSIS — F4325 Adjustment disorder with mixed disturbance of emotions and conduct: Secondary | ICD-10-CM

## 2020-12-07 DIAGNOSIS — Z046 Encounter for general psychiatric examination, requested by authority: Secondary | ICD-10-CM | POA: Diagnosis not present

## 2020-12-07 DIAGNOSIS — Z7951 Long term (current) use of inhaled steroids: Secondary | ICD-10-CM | POA: Insufficient documentation

## 2020-12-07 DIAGNOSIS — Z7982 Long term (current) use of aspirin: Secondary | ICD-10-CM | POA: Diagnosis not present

## 2020-12-07 DIAGNOSIS — F03918 Unspecified dementia, unspecified severity, with other behavioral disturbance: Secondary | ICD-10-CM | POA: Insufficient documentation

## 2020-12-07 DIAGNOSIS — R0902 Hypoxemia: Secondary | ICD-10-CM | POA: Diagnosis not present

## 2020-12-07 DIAGNOSIS — Y900 Blood alcohol level of less than 20 mg/100 ml: Secondary | ICD-10-CM | POA: Insufficient documentation

## 2020-12-07 DIAGNOSIS — I10 Essential (primary) hypertension: Secondary | ICD-10-CM | POA: Insufficient documentation

## 2020-12-07 DIAGNOSIS — Z743 Need for continuous supervision: Secondary | ICD-10-CM | POA: Diagnosis not present

## 2020-12-07 DIAGNOSIS — R404 Transient alteration of awareness: Secondary | ICD-10-CM | POA: Diagnosis not present

## 2020-12-07 HISTORY — DX: Adjustment disorder with mixed disturbance of emotions and conduct: F43.25

## 2020-12-07 LAB — URINALYSIS, ROUTINE W REFLEX MICROSCOPIC
Bacteria, UA: NONE SEEN
Bilirubin Urine: NEGATIVE
Glucose, UA: NEGATIVE mg/dL
Hgb urine dipstick: NEGATIVE
Ketones, ur: NEGATIVE mg/dL
Leukocytes,Ua: NEGATIVE
Nitrite: NEGATIVE
Protein, ur: NEGATIVE mg/dL
Specific Gravity, Urine: 1.011 (ref 1.005–1.030)
pH: 6 (ref 5.0–8.0)

## 2020-12-07 LAB — COMPREHENSIVE METABOLIC PANEL
ALT: 18 U/L (ref 0–44)
AST: 21 U/L (ref 15–41)
Albumin: 4 g/dL (ref 3.5–5.0)
Alkaline Phosphatase: 38 U/L (ref 38–126)
Anion gap: 9 (ref 5–15)
BUN: 17 mg/dL (ref 8–23)
CO2: 29 mmol/L (ref 22–32)
Calcium: 10.2 mg/dL (ref 8.9–10.3)
Chloride: 97 mmol/L — ABNORMAL LOW (ref 98–111)
Creatinine, Ser: 0.83 mg/dL (ref 0.44–1.00)
GFR, Estimated: >60 mL/min (ref >60)
Glucose, Bld: 113 mg/dL — ABNORMAL HIGH (ref 70–99)
Potassium: 4 mmol/L (ref 3.5–5.1)
Sodium: 135 mmol/L (ref 135–145)
Total Bilirubin: 0.8 mg/dL (ref 0.3–1.2)
Total Protein: 7.9 g/dL (ref 6.5–8.1)

## 2020-12-07 LAB — SALICYLATE LEVEL: Salicylate Lvl: <7 mg/dL — ABNORMAL LOW (ref 7.0–30.0)

## 2020-12-07 LAB — RESP PANEL BY RT-PCR (FLU A&B, COVID) ARPGX2
Influenza A by PCR: NEGATIVE
Influenza B by PCR: NEGATIVE
SARS Coronavirus 2 by RT PCR: NEGATIVE

## 2020-12-07 LAB — CBC WITH DIFFERENTIAL/PLATELET
Abs Immature Granulocytes: 0.02 10*3/uL (ref 0.00–0.07)
Basophils Absolute: 0 10*3/uL (ref 0.0–0.1)
Basophils Relative: 1 %
Eosinophils Absolute: 0.2 10*3/uL (ref 0.0–0.5)
Eosinophils Relative: 2 %
HCT: 41.9 % (ref 36.0–46.0)
Hemoglobin: 14 g/dL (ref 12.0–15.0)
Immature Granulocytes: 0 %
Lymphocytes Relative: 16 %
Lymphs Abs: 1.1 10*3/uL (ref 0.7–4.0)
MCH: 32 pg (ref 26.0–34.0)
MCHC: 33.4 g/dL (ref 30.0–36.0)
MCV: 95.7 fL (ref 80.0–100.0)
Monocytes Absolute: 0.6 10*3/uL (ref 0.1–1.0)
Monocytes Relative: 10 %
Neutro Abs: 4.7 10*3/uL (ref 1.7–7.7)
Neutrophils Relative %: 71 %
Platelets: 237 10*3/uL (ref 150–400)
RBC: 4.38 MIL/uL (ref 3.87–5.11)
RDW: 12.8 % (ref 11.5–15.5)
WBC: 6.6 10*3/uL (ref 4.0–10.5)
nRBC: 0 % (ref 0.0–0.2)

## 2020-12-07 LAB — URINE DRUG SCREEN, QUALITATIVE (ARMC ONLY)
Amphetamines, Ur Screen: NOT DETECTED
Barbiturates, Ur Screen: NOT DETECTED
Benzodiazepine, Ur Scrn: NOT DETECTED
Cannabinoid 50 Ng, Ur ~~LOC~~: NOT DETECTED
Cocaine Metabolite,Ur ~~LOC~~: NOT DETECTED
MDMA (Ecstasy)Ur Screen: NOT DETECTED
Methadone Scn, Ur: NOT DETECTED
Opiate, Ur Screen: NOT DETECTED
Phencyclidine (PCP) Ur S: NOT DETECTED
Tricyclic, Ur Screen: NOT DETECTED

## 2020-12-07 LAB — ACETAMINOPHEN LEVEL: Acetaminophen (Tylenol), Serum: <10 ug/mL — ABNORMAL LOW (ref 10–30)

## 2020-12-07 LAB — ETHANOL: Alcohol, Ethyl (B): <10 mg/dL (ref <10)

## 2020-12-07 MED ORDER — ACETAMINOPHEN 325 MG PO TABS: 650.0000 mg | ORAL_TABLET | ORAL | Status: DC | PRN

## 2020-12-07 MED ORDER — ONDANSETRON HCL 4 MG PO TABS: 4.0000 mg | ORAL_TABLET | Freq: Three times a day (TID) | ORAL | Status: DC | PRN

## 2020-12-07 NOTE — ED Notes (Signed)
Changed into burgundy scrubs by RN. Pt belonging bag labeled 1 of 1 with name consists of pants, underwear, socks, shoes, shirt, bra, cell phone, rubber bracelet, watch and jacket.

## 2020-12-07 NOTE — ED Notes (Signed)
VOLUNTARY AS IVC WAS RESCINDED BY DR CLAPACS, NURSE ALLY INFORMED

## 2020-12-07 NOTE — ED Triage Notes (Addendum)
BIB [facility] and [facility] from [place]. Per report, pt made comments about wanting to OD on pills to kill herself and possibly threatening roommate. Pt denies threatening roommate. States that she did threaten to take pills due to eviction upcoming on the 20th. Pills found in her room by staff. Ambulatory with RN. Denies pain other than chronic right leg pain. Cooperative and calm. EMS reports [facility].

## 2020-12-07 NOTE — ED Notes (Signed)
EDP at bedside  

## 2020-12-07 NOTE — ED Notes (Signed)
INVOLUNTARY WITH ALL PAPERS ON THE CHART/AWAITING TTS/PSYCH CONSULT

## 2020-12-07 NOTE — ED Notes (Signed)
Spoke with Etter Sjogren and updated that Melrose Nakayama, niece was unable to confirm a ride for patient home. States that she will call the [facility] and call RN back.

## 2020-12-07 NOTE — ED Notes (Signed)
Called Kylie Hurst to update of difficulties with transportation back to facility, states to call pt POA and niece, Melrose Nakayama. Melrose Nakayama, niece called and informed of difficulties getting patient transported back to facility. States that she lives in [place] and just got home. She is unsure if she can transport patient back to facility. Will call back.

## 2020-12-07 NOTE — ED Notes (Signed)
Per Demetria-TTS, she called [facility] pt able to return to [employer] but will have to arrange for [facility] to take her. [facility] to [facility]

## 2020-12-07 NOTE — ED Notes (Signed)
Contacted Tammy Rose, niece or POA contacted in regards to disposition of discharge.  [phone] Sjogren care coordinator number given to RN.

## 2020-12-07 NOTE — ED Provider Notes (Signed)
[facility] - Inpatient Emergency Department Provider Note  ____________________________________________  Time seen: Approximately 3:09 PM  I have reviewed the triage vital signs and the nursing notes.   HISTORY  Chief Complaint Psychiatric Evaluation    HPI Kylie Hurst is a 75 y.o. female with a history of aortic aneurysm, diabetes, constipation, COPD, Alzheimer's dementia who comes ED from [place] assisted living facility under IVC due to reporting a plan to kill herself by overdosing on medication.  She describes being in an escalating conflict with her roommate at the assisted living facility over TV usage.  She has had this roommate for 2 months, and they got their own TV about a month ago.  Since that time, the patient and her roommate have both been watching their own TVs, causing disruptive noise making it difficult to focus on one program.  Additionally, with the month of October the roommates TV was showing Halloween type programs which was disturbing to the patient.  The patient tried using her roommates television and then tried unplugging the roommates television to prevent them from using it.  More recently, she reports that she had started to become verbally aggressive toward her roommate and when confronted she felt helpless to regain control and reported wanting to kill herself out of frustration.  She currently denies SI or HI or any hallucinations.  Notably, the assisted living facility keeps all of the medications secured and dispenses them to patient's.  The patient denies any ingestion of other substances today or any other self-injurious behaviors.  Denies any pain or medical complaints.    Past Medical History:  Diagnosis Date   Aortic aneurysm ([place]) 2011   3.60mm   Arthritis    Back spasm    Bulging discs    Chronic back pain    Dementia (HCC)    Depression    Diabetes mellitus without complication (HCC)    fasting 90-110   Emphysema     Environmental and seasonal allergies    Hyperlipidemia    Hypertension    Neuromuscular disorder ([place])    Onycholysis of toenail    Osteoporosis    Ovarian cyst 06/01/2009   5 cm   Restless legs    Spigelian hernia    left ventral   Wears glasses      Patient Active Problem List   Diagnosis Date Noted   Acquired hammer toe of left foot 05/26/2020   Allergic rhinitis due to pollen 05/26/2020   Atherosclerosis 05/26/2020   Benign neoplasm of colon 05/26/2020   Chronic obstructive pulmonary disease, unspecified ([place]) 05/26/2020   Constipation 05/26/2020   Decreased estrogen level 05/26/2020   Hardening of the aorta (main artery of the heart) ([place]) 05/26/2020   Hypercalcemia 05/26/2020   Hyperlipidemia 05/26/2020   Hypertension 05/26/2020   Hypokalemia 05/26/2020   Osteoporosis 05/26/2020   Personal history of colonic polyps 05/26/2020   Rectocele 05/26/2020   Skin sensation disturbance 05/26/2020   Tobacco dependence 05/26/2020   Vaginal bleeding 05/26/2020   Pain due to onychomycosis of toenails of both feet 07/17/2018   Diabetes mellitus ([place]) 07/17/2018   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   Right lumbar radiculopathy 05/30/2013   Diabetic neuropathy ([place]) 05/30/2013   Cervical spondylosis without myelopathy 12/02/2012   Lumbosacral spondylosis without myelopathy 12/02/2012   Spondylolisthesis at L5-S1 level 12/02/2012   AAA (abdominal aortic aneurysm) without rupture 07/03/2012     Past Surgical History:  Procedure Laterality Date   ANTERIOR CERVICAL DECOMP/DISCECTOMY FUSION N/A 12/18/2012  Procedure: ANTERIOR CERVICAL DECOMPRESSION/DISCECTOMY FUSION 1 LEVEL;  Surgeon: Sinclair Ship, MD;  Location: [place];  Service: Orthopedics;  Laterality: N/A;  Anterior cervical decompression fusion, cervical 7 - thoracic 1 with instrumentation, allograft   COLONOSCOPY     INSERTION OF MESH Left 05/14/2014   Procedure: INSERTION OF MESH;  Surgeon: Donnie Mesa, MD;   Location: [place];  Service: General;  Laterality: Left;   NECK SURGERY     SPIGELIAN HERNIA  05/14/2014   VENTRAL HERNIA REPAIR Left 05/14/2014   Procedure: ATTEMPTED LAPAROSCOPIC REPAIR LEFT SPIGLIAN HERINA CONVERTED TO OPEN REPAIR;  Surgeon: Donnie Mesa, MD;  Location: [place];  Service: General;  Laterality: Left;     Prior to Admission medications   Medication Sig Start Date End Date Taking? Authorizing Provider  ACCU-CHEK AVIVA PLUS test strip USE AS DIRECTED TO TEST BLOOD SUGAR DAILY 02/21/18   [provider]  albuterol (VENTOLIN HFA) 108 (90 Base) MCG/ACT inhaler Inhale 1 puff into the lungs every 4 (four) hours as needed for shortness of breath or wheezing. 02/10/20   [provider]  ANORO ELLIPTA 62.5-25 MCG/INH AEPB Inhale 1 puff into the lungs in the morning. 08/11/16   [provider]  Ascorbic Acid (VITAMIN C) 1000 MG tablet Take 1,000 mg by mouth See admin instructions. Take 1,000 mg by mouth once a day in the Spring, Summer, and Fall- increase to 1,000 mg two times a day in the Winter    [provider]  aspirin (ASPIRIN CHILDRENS) 81 MG chewable tablet Chew 1 tablet (81 mg total) by mouth daily. 07/03/14   Kirsteins, Luanna Salk, MD  b complex vitamins tablet Take 1 tablet by mouth daily.    [provider]  BIOTIN PO Take 1,000 mg by mouth 2 (two) times daily.    [provider]  Calcium Carbonate-Vitamin D 600-400 MG-UNIT per tablet Take 1 tablet by mouth daily.     [provider]  cetirizine (ZYRTEC) 10 MG tablet Take 10 mg by mouth at bedtime.    [provider]  denosumab (PROLIA) 60 MG/ML SOSY injection Inject 60 mg into the skin every 6 (six) months.    [provider]  diclofenac Sodium (VOLTAREN) 1 % GEL Apply 4 g topically 4 (four) times daily. 06/10/20   Palumbo, April, MD  fexofenadine (ALLEGRA) 180 MG tablet Take 180 mg by mouth daily as needed for rhinitis or allergies.    [provider]  fluticasone (FLONASE) 50 MCG/ACT nasal spray Place 1-2 sprays into both nostrils daily.    [provider]  guaiFENesin (MUCINEX) 600 MG 12 hr tablet Take 600 mg by mouth 2 (two) times daily.    [provider]  hydrochlorothiazide (MICROZIDE) 12.5 MG capsule Take 12.5 mg by mouth every evening. 05/12/15   [provider]  KLOR-CON M20 20 MEQ tablet Take 20 mEq by mouth every evening. 12/07/16   [provider]  lidocaine (LIDODERM) 5 % Place 1 patch onto the skin daily. Remove & Discard patch within 12 hours or as directed by MD 06/10/20   Randal Buba, April, MD  lisinopril (PRINIVIL,ZESTRIL) 5 MG tablet Take 5 mg by mouth at bedtime.     [provider]  Multiple Vitamins-Minerals (MULTIVITAMIN WITH MINERALS) tablet Take 1 tablet by mouth every evening.     [provider]  Omega-3 Fatty Acids (FISH OIL) 1000 MG CAPS Take 1 capsule by mouth daily.    [provider]  polyethylene  glycol-electrolytes (NULYTELY) 420 g solution See admin instructions. 02/03/20   [provider]  pravastatin (PRAVACHOL) 20 MG tablet Take 20 mg by mouth at bedtime.     [provider]  [facility] injection  08/08/16   [provider]  tiZANidine (ZANAFLEX) 2 MG tablet Take 1 tablet (2 mg total) by mouth 2 (two) times daily. 06/11/20   Kirsteins, Luanna Salk, MD  traMADol (ULTRAM) 50 MG tablet Take 1 tablet (50 mg total) by mouth every 8 (eight) hours as needed. no more than 3 per day Patient taking differently: Take 50 mg by mouth 3 (three) times daily. 06/11/20   Kirsteins, Luanna Salk, MD     Allergies Other   Family History  Problem Relation Age of Onset   Diabetes Mother    Cancer Sister    Epilepsy Brother     Social History Social History   Tobacco Use   Smoking status: Former    Packs/day: 1.00    Years: 52.00    Pack years: 52.00    Types: Cigarettes   Smokeless tobacco: Never   Tobacco comments:    Currently on  Chantix  Vaping Use   Vaping Use: Never used  Substance Use Topics   Alcohol use: No   Drug use: No    Review of Systems  Constitutional:   No fever or chills.  ENT:   No sore throat. No rhinorrhea. Cardiovascular:   No chest pain or syncope. Respiratory:   No dyspnea or cough. Gastrointestinal:   Negative for abdominal pain, vomiting and diarrhea.  Musculoskeletal:   Negative for focal pain or swelling All other systems reviewed and are negative except as documented above in ROS and HPI.  ____________________________________________   PHYSICAL EXAM:  VITAL SIGNS: ED Triage Vitals  Enc Vitals Group     BP 12/07/20 1458 112/62     Pulse Rate 12/07/20 1458 68     Resp 12/07/20 1458 16     Temp 12/07/20 1458 98.6 F (37 C)     Temp Source 12/07/20 1458 Oral     SpO2 12/07/20 1458 96 %     Weight 12/07/20 1443 110 lb 3.7 oz (50 kg)     Height 12/07/20 1443 4\' 9"  (1.448 m)     Head Circumference --      Peak Flow --      Pain Score 12/07/20 1443 0     Pain Loc --      Pain Edu? --      Excl. in [place]? --     Vital signs reviewed, nursing assessments reviewed.   Constitutional:   Alert and oriented. Non-toxic appearance. Eyes:   Conjunctivae are normal. EOMI. PERRL. ENT      Head:   Normocephalic and atraumatic.      Nose:   Wearing a mask.      Mouth/Throat:   Wearing a mask.      Neck:   No meningismus. Full ROM. Hematological/Lymphatic/Immunilogical:   No cervical lymphadenopathy. Cardiovascular:   RRR. Symmetric bilateral radial and DP pulses.  No murmurs. Cap refill less than 2 seconds. Respiratory:   Normal respiratory effort without tachypnea/retractions. Breath sounds are clear and equal bilaterally. No wheezes/rales/rhonchi.  Musculoskeletal:   Normal range of motion in all extremities. No joint effusions.  No lower extremity tenderness.  No edema. Neurologic:   Normal speech and language.  Appropriately tearful when describing her frustration with her  roommates TV. Motor grossly intact. No acute focal  neurologic deficits are appreciated.  Skin:    Skin is warm, dry and intact. No rash noted.  No petechiae, purpura, or bullae.  ____________________________________________    LABS (pertinent positives/negatives) (all labs ordered are listed, but only abnormal results are displayed) Labs Reviewed  RESP PANEL BY RT-PCR (FLU A&B, COVID) ARPGX2  ACETAMINOPHEN LEVEL  COMPREHENSIVE METABOLIC PANEL  ETHANOL  CBC WITH DIFFERENTIAL/PLATELET  SALICYLATE LEVEL  URINE DRUG SCREEN, QUALITATIVE ([facility] ONLY)  URINALYSIS, ROUTINE W REFLEX MICROSCOPIC   ____________________________________________   EKG    ____________________________________________    RADIOLOGY  No results found.  ____________________________________________   PROCEDURES Procedures  ____________________________________________    CLINICAL IMPRESSION / ASSESSMENT AND PLAN / ED COURSE  Medications ordered in the ED: Medications  ondansetron (ZOFRAN) tablet 4 mg (has no administration in time range)  acetaminophen (TYLENOL) tablet 650 mg (has no administration in time range)    Pertinent labs & imaging results that were available during my care of the patient were reviewed by me and considered in my medical decision making (see chart for details).  Kylie Hurst was evaluated in Emergency Department on 12/07/2020 for the symptoms described in the history of present illness. She was evaluated in the context of the global COVID-19 pandemic, which necessitated consideration that the patient might be at risk for infection with the SARS-CoV-2 virus that causes COVID-19. Institutional protocols and algorithms that pertain to the evaluation of patients at risk for COVID-19 are in a state of rapid change based on information released by regulatory bodies including the CDC and federal and state organizations. These policies and algorithms were followed during the  patient's care in the ED.   Patient presents under involuntary commitment due to agitation and reported suicidal ideation.  With her history of dementia and poor judgment and impulse control, I will continue the IVC for now pending psychiatry evaluation.  She has no acute medical complaints, no acute ingestion, medically stable at this time.  The patient has been placed in psychiatric observation due to the need to provide a safe environment for the patient while obtaining psychiatric consultation and evaluation, as well as ongoing medical and medication management to treat the patient's condition.  The patient has been placed under full IVC at this time.       ____________________________________________   FINAL CLINICAL IMPRESSION(S) / ED DIAGNOSES    Final diagnoses:  Dementia with behavioral disturbance     ED Discharge Orders     None       Portions of this note were generated with dragon dictation software. Dictation errors may occur despite best attempts at proofreading.    Carrie Mew, MD 12/07/20 [phone]
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
## 2020-12-07 NOTE — ED Notes (Signed)
[facility] unavailable to take patient back to facility. [place] [facility] contacted as they brought patient in under IVC.

## 2020-12-07 NOTE — ED Notes (Signed)
VOLUNTARY READY TO GO BACK TO [facility]/NEEDS TRANSPORT AS FACILITY UNABLE TO PICKUP TONIGHT

## 2020-12-07 NOTE — ED Notes (Signed)
[phone] Sjogren care coordinator number called [facility] [phone] administrator states that pt was seen by an NP and that psychiatric treatment was needed. They also report that pt has been hiding her mediations and storing them in her room, with plans to take them to overdose

## 2020-12-07 NOTE — ED Notes (Signed)
Hospital meal provided.  100% consumed, pt tolerated w/o complaints.  Waste discarded appropriately.   

## 2020-12-07 NOTE — ED Notes (Addendum)
PD unable to transport patient back to [place] as they did not take out commitment paperwork, only transported patient. Pt able to walk independently without any difficulties.

## 2020-12-07 NOTE — Consult Note (Signed)
Kylie Hurst Psychiatry Consult   Reason for Consult: Consult for 75 year old woman with some history of anxiety brought here under IVC after making suicidal statements at her living facility Referring Physician: Jari Pigg Patient Identification: Kylie Hurst MRN:  [id number] Principal Diagnosis: Adjustment disorder with mixed disturbance of emotions and conduct Diagnosis:  Principal Problem:   Adjustment disorder with mixed disturbance of emotions and conduct Active Problems:   Dementia ([place])   Total Time spent with patient: 1 hour  Subjective:   Kylie Hurst is a 75 y.o. female patient admitted with "I said it but I did not mean it".  HPI: Patient seen chart reviewed.  75 year old woman with mild dementia who has been living at [facility] living facility.  Apparently she has been involved in an ongoing disagreement with her roommate about the use of the television that has kept her anxious and upset.  Today she got into some kind of squabble and ended up making a statement in front of staff that she was going to kill herself and was going to overdose.  Apparently patient had been putting aside some of her prescription medicine and hiding it.  Patient did not actually take anything or make any attempt to harm herself.  On interview this evening patient is anxious.  Clearly having some cognitive slowing and confusion but alert and oriented and for an appropriate examination.  She says she feels nervous but is now taking medicine for it and feels like it is helping a little.  She can sleep at night but only with earplugs in.  Patient admits having made a statement about suicide but says she did not mean that at all she was just very upset and frustrated at the time.  She denies any wish to die.  Denies any intention or plan of killing herself or doing anything violent.  Denies any hallucinations.  Past Psychiatric History: Patient has been seen by a doctor at [facility] and apparently has  been prescribed Lexapro and buspirone.  No past history of suicide attempts no known history of hospitalizations or substance abuse.  Patient had been diagnosed with dementia by her primary care doctor  Risk to Self:   Risk to Others:   Prior Inpatient Therapy:   Prior Outpatient Therapy:    Past Medical History:  Past Medical History:  Diagnosis Date   Aortic aneurysm ([place]) 2011   3.27mm   Arthritis    Back spasm    Bulging discs    Chronic back pain    Dementia ([place])    Depression    Diabetes mellitus without complication (HCC)    fasting 90-110   Emphysema    Environmental and seasonal allergies    Hyperlipidemia    Hypertension    Neuromuscular disorder ([place])    Onycholysis of toenail    Osteoporosis    Ovarian cyst 06/01/2009   5 cm   Restless legs    Spigelian hernia    left ventral   Wears glasses     Past Surgical History:  Procedure Laterality Date   ANTERIOR CERVICAL DECOMP/DISCECTOMY FUSION N/A 12/18/2012   Procedure: ANTERIOR CERVICAL DECOMPRESSION/DISCECTOMY FUSION 1 LEVEL;  Surgeon: Sinclair Ship, MD;  Location: [facility];  Service: Orthopedics;  Laterality: N/A;  Anterior cervical decompression fusion, cervical 7 - thoracic 1 with instrumentation, allograft   COLONOSCOPY     INSERTION OF MESH Left 05/14/2014   Procedure: INSERTION OF MESH;  Surgeon: Donnie Mesa, MD;  Location: [facility];  Service: General;  Laterality: Left;   NECK SURGERY     SPIGELIAN HERNIA  05/14/2014   VENTRAL HERNIA REPAIR Left 05/14/2014   Procedure: ATTEMPTED LAPAROSCOPIC REPAIR LEFT SPIGLIAN HERINA CONVERTED TO OPEN REPAIR;  Surgeon: Donnie Mesa, MD;  Location: [facility];  Service: General;  Laterality: Left;   Family History:  Family History  Problem Relation Age of Onset   Diabetes Mother    Cancer Sister    Epilepsy Brother    Family Psychiatric  History: Denies knowing of any Social History:  Social History   Substance and Sexual Activity  Alcohol Use No     Social  History   Substance and Sexual Activity  Drug Use No    Social History   Socioeconomic History   Marital status: Divorced    Spouse name: Not on file   Number of children: Not on file   Years of education: Not on file   Highest education level: Not on file  Occupational History   Not on file  Tobacco Use   Smoking status: Former    Packs/day: 1.00    Years: 52.00    Pack years: 52.00    Types: Cigarettes   Smokeless tobacco: Never   Tobacco comments:    Currently on Chantix  Vaping Use   Vaping Use: Never used  Substance and Sexual Activity   Alcohol use: No   Drug use: No   Sexual activity: Not on file  Other Topics Concern   Not on file  Social History Narrative   Not on file   Social Determinants of Health   Financial Resource Strain: Not on file  Food Insecurity: Not on file  Transportation Needs: Not on file  Physical Activity: Not on file  Stress: Not on file  Social Connections: Not on file   Additional Social History:    Allergies:   Allergies  Allergen Reactions   Other Itching and Other (See Comments)    Seasonal Allergies- Itchy eyes, runny nose, etc..    Labs:  Results for orders placed or performed during the hospital encounter of 12/07/20 (from the past 48 hour(s))  Resp Panel by RT-PCR (Flu A&B, Covid) Nasopharyngeal Swab     Status: None   Collection Time: 12/07/20  2:39 PM   Specimen: Nasopharyngeal Swab; Nasopharyngeal(NP) swabs in vial transport medium  Result Value Ref Range   SARS Coronavirus 2 by RT PCR NEGATIVE NEGATIVE    Comment: (NOTE) SARS-CoV-2 target nucleic acids are NOT DETECTED.  The SARS-CoV-2 RNA is generally detectable in upper respiratory specimens during the acute phase of infection. The lowest concentration of SARS-CoV-2 viral copies this assay can detect is 138 copies/mL. A negative result does not preclude SARS-Cov-2 infection and should not be used as the sole basis for treatment or other patient management  decisions. A negative result may occur with  improper specimen collection/handling, submission of specimen other than nasopharyngeal swab, presence of viral mutation(s) within the areas targeted by this assay, and inadequate number of viral copies(<138 copies/mL). A negative result must be combined with clinical observations, patient history, and epidemiological information. The expected result is Negative.  Fact Sheet for Patients:  EntrepreneurPulse.com.au  Fact Sheet for Healthcare Providers:  IncredibleEmployment.be  This test is no t yet approved or cleared by the [place] FDA and  has been authorized for detection and/or diagnosis of SARS-CoV-2 by FDA under an Emergency Use Authorization (EUA). This EUA will remain  in effect (meaning this test can be  used) for the duration of the COVID-19 declaration under Section 564(b)(1) of the Act, 21 U.S.C.section 360bbb-3(b)(1), unless the authorization is terminated  or revoked sooner.       Influenza A by PCR NEGATIVE NEGATIVE   Influenza B by PCR NEGATIVE NEGATIVE    Comment: (NOTE) The Xpert Xpress SARS-CoV-2/FLU/RSV plus assay is intended as an aid in the diagnosis of influenza from Nasopharyngeal swab specimens and should not be used as a sole basis for treatment. Nasal washings and aspirates are unacceptable for Xpert Xpress SARS-CoV-2/FLU/RSV testing.  Fact Sheet for Patients: EntrepreneurPulse.com.au  Fact Sheet for Healthcare Providers: IncredibleEmployment.be  This test is not yet approved or cleared by the [place] FDA and has been authorized for detection and/or diagnosis of SARS-CoV-2 by FDA under an Emergency Use Authorization (EUA). This EUA will remain in effect (meaning this test can be used) for the duration of the COVID-19 declaration under Section 564(b)(1) of the Act, 21 U.S.C. section 360bbb-3(b)(1), unless the authorization  is terminated or revoked.  Performed at [facility], [address], [place], [place] [address]   Acetaminophen level     Status: Abnormal   Collection Time: 12/07/20  2:39 PM  Result Value Ref Range   Acetaminophen (Tylenol), Serum <10 (L) 10 - 30 ug/mL    Comment: (NOTE) Therapeutic concentrations vary significantly. A range of 10-30 ug/mL  may be an effective concentration for many patients. However, some  are best treated at concentrations outside of this range. Acetaminophen concentrations >150 ug/mL at 4 hours after ingestion  and >50 ug/mL at 12 hours after ingestion are often associated with  toxic reactions.  Performed at [facility], [address] [place], [place] [address]   Comprehensive metabolic panel     Status: Abnormal   Collection Time: 12/07/20  2:39 PM  Result Value Ref Range   Sodium 135 135 - 145 mmol/L   Potassium 4.0 3.5 - 5.1 mmol/L   Chloride 97 (L) 98 - 111 mmol/L   CO2 29 22 - 32 mmol/L   Glucose, Bld 113 (H) 70 - 99 mg/dL    Comment: Glucose reference range applies only to samples taken after fasting for at least 8 hours.   BUN 17 8 - 23 mg/dL   Creatinine, Ser 0.83 0.44 - 1.00 mg/dL   Calcium 10.2 8.9 - 10.3 mg/dL   Total Protein 7.9 6.5 - 8.1 g/dL   Albumin 4.0 3.5 - 5.0 g/dL   AST 21 15 - 41 U/L   ALT 18 0 - 44 U/L   Alkaline Phosphatase 38 38 - 126 U/L   Total Bilirubin 0.8 0.3 - 1.2 mg/dL   GFR, Estimated >60 >60 mL/min    Comment: (NOTE) Calculated using the CKD-EPI Creatinine Equation (2021)    Anion gap 9 5 - 15    Comment: Performed at [facility], [address], [place], [place] [address]  Ethanol     Status: None   Collection Time: 12/07/20  2:39 PM  Result Value Ref Range   Alcohol, Ethyl (B) <10 <10 mg/dL    Comment: (NOTE) Lowest detectable limit for serum alcohol is 10 mg/dL.  For medical purposes only. Performed at [facility], [address] [place], [place] [address]    CBC with Differential     Status: None   Collection Time: 12/07/20  2:39 PM  Result Value Ref Range   WBC 6.6 4.0 - 10.5 K/uL   RBC 4.38 3.87 -  5.11 MIL/uL   Hemoglobin 14.0 12.0 - 15.0 g/dL   HCT 41.9 36.0 - 46.0 %   MCV 95.7 80.0 - 100.0 fL   MCH 32.0 26.0 - 34.0 pg   MCHC 33.4 30.0 - 36.0 g/dL   RDW 12.8 11.5 - 15.5 %   Platelets 237 150 - 400 K/uL   nRBC 0.0 0.0 - 0.2 %   Neutrophils Relative % 71 %   Neutro Abs 4.7 1.7 - 7.7 K/uL   Lymphocytes Relative 16 %   Lymphs Abs 1.1 0.7 - 4.0 K/uL   Monocytes Relative 10 %   Monocytes Absolute 0.6 0.1 - 1.0 K/uL   Eosinophils Relative 2 %   Eosinophils Absolute 0.2 0.0 - 0.5 K/uL   Basophils Relative 1 %   Basophils Absolute 0.0 0.0 - 0.1 K/uL   Immature Granulocytes 0 %   Abs Immature Granulocytes 0.02 0.00 - 0.07 K/uL    Comment: Performed at [facility], [address] [place], [place] [address]  Salicylate level     Status: Abnormal   Collection Time: 12/07/20  2:39 PM  Result Value Ref Range   Salicylate Lvl <7.3 (L) 7.0 - 30.0 mg/dL    Comment: Performed at [facility], [address], [place], [place] [address]  Urine Drug Screen, Qualitative     Status: None   Collection Time: 12/07/20  2:57 PM  Result Value Ref Range   Tricyclic, Ur Screen NONE DETECTED NONE DETECTED   Amphetamines, Ur Screen NONE DETECTED NONE DETECTED   MDMA (Ecstasy)Ur Screen NONE DETECTED NONE DETECTED   Cocaine Metabolite,Ur [place] NONE DETECTED NONE DETECTED   Opiate, Ur Screen NONE DETECTED NONE DETECTED   Phencyclidine (PCP) Ur S NONE DETECTED NONE DETECTED   Cannabinoid 50 Ng, Ur [place] NONE DETECTED NONE DETECTED   Barbiturates, Ur Screen NONE DETECTED NONE DETECTED   Benzodiazepine, Ur Scrn NONE DETECTED NONE DETECTED   Methadone Scn, Ur NONE DETECTED NONE DETECTED    Comment: (NOTE) Tricyclics + metabolites, urine    Cutoff 1000 ng/mL Amphetamines + metabolites, urine  Cutoff 1000 ng/mL MDMA (Ecstasy), urine               Cutoff 500 ng/mL Cocaine Metabolite, urine          Cutoff 300 ng/mL Opiate + metabolites, urine        Cutoff 300 ng/mL Phencyclidine (PCP), urine         Cutoff 25 ng/mL Cannabinoid, urine                 Cutoff 50 ng/mL Barbiturates + metabolites, urine  Cutoff 200 ng/mL Benzodiazepine, urine              Cutoff 200 ng/mL Methadone, urine                   Cutoff 300 ng/mL  The urine drug screen provides only a preliminary, unconfirmed analytical test result and should not be used for non-medical purposes. Clinical consideration and professional judgment should be applied to any positive drug screen result due to possible interfering substances. A more specific alternate chemical method must be used in order to obtain a confirmed analytical result. Gas chromatography / mass spectrometry (GC/MS) is the preferred confirm atory method. Performed at [facility], [address] [place],  [address]   Urinalysis, Routine w reflex microscopic Urine, Clean Catch     Status: Abnormal   Collection Time: 12/07/20  2:57 PM  Result Value Ref Range   Color, Urine YELLOW (A) YELLOW   APPearance CLOUDY (A) CLEAR   Specific Gravity, Urine 1.011 1.005 - 1.030   pH 6.0 5.0 - 8.0   Glucose, UA NEGATIVE NEGATIVE mg/dL   Hgb urine dipstick NEGATIVE NEGATIVE   Bilirubin Urine NEGATIVE NEGATIVE   Ketones, ur NEGATIVE NEGATIVE mg/dL   Protein, ur NEGATIVE NEGATIVE mg/dL   Nitrite NEGATIVE NEGATIVE   Leukocytes,Ua NEGATIVE NEGATIVE   RBC / HPF 0-5 0 - 5 RBC/hpf   WBC, UA 0-5 0 - 5 WBC/hpf   Bacteria, UA NONE SEEN NONE SEEN   Squamous Epithelial / LPF 0-5 0 - 5   Mucus PRESENT     Comment: Performed at [facility], [address] [place], [place] [address]    Current Facility-Administered Medications  Medication Dose Route Frequency Provider Last Rate Last Admin   acetaminophen (TYLENOL) tablet 650 mg  650 mg Oral Q4H PRN Carrie Mew, MD       ondansetron  [facility]) tablet 4 mg  4 mg Oral Q8H PRN Carrie Mew, MD       Current Outpatient Medications  Medication Sig Dispense Refill   ANORO ELLIPTA 62.5-25 MCG/INH AEPB Inhale 1 puff into the lungs in the morning.     Ascorbic Acid (VITAMIN C) 1000 MG tablet Take 1,000 mg by mouth See admin instructions. Take 1,000 mg by mouth once a day in the Spring, Summer, and Fall- increase to 1,000 mg two times a day in the Winter     aspirin (ASPIRIN CHILDRENS) 81 MG chewable tablet Chew 1 tablet (81 mg total) by mouth daily. 30 tablet 0   b complex vitamins tablet Take 1 tablet by mouth daily.     busPIRone (BUSPAR) 5 MG tablet Take 5 mg by mouth at bedtime.     busPIRone (BUSPAR) 7.5 MG tablet Take 7.5 mg by mouth 2 (two) times daily.     Calcium Carbonate-Vitamin D 600-400 MG-UNIT per tablet Take 1 tablet by mouth daily.      cholecalciferol (VITAMIN D3) 25 MCG (1000 UNIT) tablet Take 2,000 Units by mouth daily.     escitalopram (LEXAPRO) 10 MG tablet Take 10 mg by mouth daily.     gabapentin (NEURONTIN) 300 MG capsule Take 300 mg by mouth at bedtime.     hydrochlorothiazide (HYDRODIURIL) 12.5 MG tablet Take 12.5 mg by mouth daily.     KLOR-CON M20 20 MEQ tablet Take 20 mEq by mouth every evening.     lisinopril (PRINIVIL,ZESTRIL) 5 MG tablet Take 5 mg by mouth at bedtime.      Multiple Vitamins-Minerals (MULTIVITAMIN WITH MINERALS) tablet Take 1 tablet by mouth every evening.      Omega-3 Fatty Acids (FISH OIL) 1000 MG CAPS Take 1 capsule by mouth daily.     pravastatin (PRAVACHOL) 20 MG tablet Take 20 mg by mouth at bedtime.      senna (SENOKOT) 8.6 MG TABS tablet Take 2 tablets by mouth 2 (two) times daily.     sodium chloride (OCEAN) 0.65 % SOLN nasal spray Place 2 sprays into both nostrils at bedtime.     tetrahydrozoline 0.05 % ophthalmic solution Place 1 drop into both eyes 3 (three) times daily.     traMADol (ULTRAM) 50 MG tablet Take 1 tablet (50 mg total) by mouth every 8 (eight) hours as  needed. no more than 3 per day (Patient taking differently: Take 50 mg by mouth 3 (three) times daily.) 90  tablet 5   ACCU-CHEK AVIVA PLUS test strip USE AS DIRECTED TO TEST BLOOD SUGAR DAILY     albuterol (VENTOLIN HFA) 108 (90 Base) MCG/ACT inhaler Inhale 1 puff into the lungs every 4 (four) hours as needed for shortness of breath or wheezing.     BIOTIN PO Take 1,000 mg by mouth 2 (two) times daily. (Patient not taking: No sig reported)     cetirizine (ZYRTEC) 10 MG tablet Take 10 mg by mouth at bedtime. (Patient not taking: No sig reported)     denosumab (PROLIA) 60 MG/ML SOSY injection Inject 60 mg into the skin every 6 (six) months.     diclofenac Sodium (VOLTAREN) 1 % GEL Apply 4 g topically 4 (four) times daily. 100 g 0   fexofenadine (ALLEGRA) 180 MG tablet Take 180 mg by mouth daily as needed for rhinitis or allergies.     fluticasone (FLONASE) 50 MCG/ACT nasal spray Place 1-2 sprays into both nostrils daily. (Patient not taking: No sig reported)     guaiFENesin (MUCINEX) 600 MG 12 hr tablet Take 600 mg by mouth 2 (two) times daily.     lidocaine (LIDODERM) 5 % Place 1 patch onto the skin daily. Remove & Discard patch within 12 hours or as directed by MD (Patient not taking: No sig reported) 30 patch 0   polyethylene glycol-electrolytes (NULYTELY) 420 g solution See admin instructions. (Patient not taking: No sig reported)     SHINGRIX injection  (Patient not taking: No sig reported)     tiZANidine (ZANAFLEX) 2 MG tablet Take 1 tablet (2 mg total) by mouth 2 (two) times daily. (Patient not taking: No sig reported) 180 tablet 0    Musculoskeletal: Strength & Muscle Tone: within normal limits Gait & Station: normal Patient leans: N/A            Psychiatric Specialty Exam:  Presentation  General Appearance: No data recorded Eye Contact:No data recorded Speech:No data recorded Speech Volume:No data recorded Handedness:No data recorded  Mood and Affect  Mood:No data  recorded Affect:No data recorded  Thought Process  Thought Processes:No data recorded Descriptions of Associations:No data recorded Orientation:No data recorded Thought Content:No data recorded History of Schizophrenia/Schizoaffective disorder:No data recorded Duration of Psychotic Symptoms:No data recorded Hallucinations:No data recorded Ideas of Reference:No data recorded Suicidal Thoughts:No data recorded Homicidal Thoughts:No data recorded  Sensorium  Memory:No data recorded Judgment:No data recorded Insight:No data recorded  Executive Functions  Concentration:No data recorded Attention Span:No data recorded Recall:No data recorded Fund of Knowledge:No data recorded Language:No data recorded  Psychomotor Activity  Psychomotor Activity:No data recorded  Assets  Assets:No data recorded  Sleep  Sleep:No data recorded  Physical Exam: Physical Exam Vitals and nursing note reviewed.  Constitutional:      Appearance: Normal appearance.  HENT:     Head: Normocephalic and atraumatic.     Mouth/Throat:     Pharynx: Oropharynx is clear.  Eyes:     Pupils: Pupils are equal, round, and reactive to light.  Cardiovascular:     Rate and Rhythm: Normal rate and regular rhythm.  Pulmonary:     Effort: Pulmonary effort is normal.     Breath sounds: Normal breath sounds.  Abdominal:     General: Abdomen is flat.     Palpations: Abdomen is soft.  Musculoskeletal:        General: Normal range of motion.  Skin:    General: Skin is warm and dry.  Neurological:     General: No  focal deficit present.     Mental Status: She is alert. Mental status is at baseline.  Psychiatric:        Attention and Perception: She is inattentive.        Mood and Affect: Mood is anxious.        Speech: Speech is delayed.        Behavior: Behavior is cooperative.        Thought Content: Thought content normal. Thought content does not include suicidal ideation.        Cognition and Memory:  Cognition is impaired. Memory is impaired.        Judgment: Judgment is impulsive.   Review of Systems  Constitutional: Negative.   HENT: Negative.    Eyes: Negative.   Respiratory: Negative.    Cardiovascular: Negative.   Gastrointestinal: Negative.   Musculoskeletal: Negative.   Skin: Negative.   Neurological: Negative.   Psychiatric/Behavioral:  Positive for memory loss. Negative for depression, hallucinations, substance abuse and suicidal ideas. The patient is nervous/anxious and has insomnia.   Blood pressure 112/62, pulse 68, temperature 98.6 F (37 C), temperature source Oral, resp. rate 16, height 4\' 9"  (1.448 m), weight 50 kg, SpO2 96 %. Body mass index is 23.85 kg/m.  Treatment Plan Summary: Plan 75 year old woman brought to the emergency room after making an impulsive statement about killing herself but not acting on it.  Patient appears to be remorseful for having made the statement.  Consistently denies any suicidal thoughts or wish to die now.  Apparently the pills that she had hidden have all been confiscated by staff who are now aware of what she had been doing.  Patient states she will stop doing that, hiding pills.  Patient does not meet commitment criteria.  Does not need inpatient hospitalization.  No indication for further ER treatment.  Discontinue IVC.  Case reviewed with emergency room physician and TTS.  Disposition: No evidence of imminent risk to self or others at present.   Patient does not meet criteria for psychiatric inpatient admission. Supportive therapy provided about ongoing stressors. Discussed crisis plan, support from social network, calling 911, coming to the Emergency Department, and calling Suicide Hotline.  Alethia Berthold, MD 12/07/2020 5:33 PM

## 2020-12-07 NOTE — Discharge Instructions (Signed)
Cleared by psychiatry for discharge home. 

## 2020-12-08 DIAGNOSIS — J449 Chronic obstructive pulmonary disease, unspecified: Secondary | ICD-10-CM | POA: Diagnosis not present

## 2020-12-08 DIAGNOSIS — I10 Essential (primary) hypertension: Secondary | ICD-10-CM | POA: Diagnosis not present

## 2020-12-08 DIAGNOSIS — M6281 Muscle weakness (generalized): Secondary | ICD-10-CM | POA: Diagnosis not present

## 2020-12-08 DIAGNOSIS — G894 Chronic pain syndrome: Secondary | ICD-10-CM | POA: Diagnosis not present

## 2020-12-08 DIAGNOSIS — F03918 Unspecified dementia, unspecified severity, with other behavioral disturbance: Secondary | ICD-10-CM | POA: Diagnosis not present

## 2020-12-08 DIAGNOSIS — R1312 Dysphagia, oropharyngeal phase: Secondary | ICD-10-CM | POA: Diagnosis not present

## 2020-12-08 DIAGNOSIS — M810 Age-related osteoporosis without current pathological fracture: Secondary | ICD-10-CM | POA: Diagnosis not present

## 2020-12-08 DIAGNOSIS — E119 Type 2 diabetes mellitus without complications: Secondary | ICD-10-CM | POA: Diagnosis not present

## 2020-12-10 ENCOUNTER — Encounter: Payer: Medicare Other | Attending: Registered Nurse | Admitting: Registered Nurse

## 2020-12-13 DIAGNOSIS — G894 Chronic pain syndrome: Secondary | ICD-10-CM | POA: Diagnosis not present

## 2020-12-13 DIAGNOSIS — M810 Age-related osteoporosis without current pathological fracture: Secondary | ICD-10-CM | POA: Diagnosis not present

## 2020-12-13 DIAGNOSIS — R1312 Dysphagia, oropharyngeal phase: Secondary | ICD-10-CM | POA: Diagnosis not present

## 2020-12-13 DIAGNOSIS — F03918 Unspecified dementia, unspecified severity, with other behavioral disturbance: Secondary | ICD-10-CM | POA: Diagnosis not present

## 2020-12-13 DIAGNOSIS — E119 Type 2 diabetes mellitus without complications: Secondary | ICD-10-CM | POA: Diagnosis not present

## 2020-12-13 DIAGNOSIS — J449 Chronic obstructive pulmonary disease, unspecified: Secondary | ICD-10-CM | POA: Diagnosis not present

## 2020-12-13 DIAGNOSIS — M6281 Muscle weakness (generalized): Secondary | ICD-10-CM | POA: Diagnosis not present

## 2020-12-13 DIAGNOSIS — I10 Essential (primary) hypertension: Secondary | ICD-10-CM | POA: Diagnosis not present

## 2020-12-14 DIAGNOSIS — I10 Essential (primary) hypertension: Secondary | ICD-10-CM | POA: Diagnosis not present

## 2020-12-14 DIAGNOSIS — M549 Dorsalgia, unspecified: Secondary | ICD-10-CM | POA: Diagnosis not present

## 2020-12-14 DIAGNOSIS — M810 Age-related osteoporosis without current pathological fracture: Secondary | ICD-10-CM | POA: Diagnosis not present

## 2020-12-14 DIAGNOSIS — J449 Chronic obstructive pulmonary disease, unspecified: Secondary | ICD-10-CM | POA: Diagnosis not present

## 2020-12-14 DIAGNOSIS — R1310 Dysphagia, unspecified: Secondary | ICD-10-CM | POA: Diagnosis not present

## 2020-12-14 DIAGNOSIS — M5030 Other cervical disc degeneration, unspecified cervical region: Secondary | ICD-10-CM | POA: Diagnosis not present

## 2020-12-14 DIAGNOSIS — M5136 Other intervertebral disc degeneration, lumbar region: Secondary | ICD-10-CM | POA: Diagnosis not present

## 2020-12-20 ENCOUNTER — Ambulatory Visit: Payer: Medicare Other

## 2020-12-27 DIAGNOSIS — F03A18 Unspecified dementia, mild, with other behavioral disturbance: Secondary | ICD-10-CM | POA: Diagnosis not present

## 2020-12-29 DIAGNOSIS — I10 Essential (primary) hypertension: Secondary | ICD-10-CM | POA: Diagnosis not present

## 2020-12-31 DIAGNOSIS — E038 Other specified hypothyroidism: Secondary | ICD-10-CM | POA: Diagnosis not present

## 2020-12-31 DIAGNOSIS — E119 Type 2 diabetes mellitus without complications: Secondary | ICD-10-CM | POA: Diagnosis not present

## 2020-12-31 DIAGNOSIS — D518 Other vitamin B12 deficiency anemias: Secondary | ICD-10-CM | POA: Diagnosis not present

## 2020-12-31 DIAGNOSIS — E559 Vitamin D deficiency, unspecified: Secondary | ICD-10-CM | POA: Diagnosis not present

## 2021-01-04 DIAGNOSIS — L603 Nail dystrophy: Secondary | ICD-10-CM | POA: Diagnosis not present

## 2021-01-04 DIAGNOSIS — I739 Peripheral vascular disease, unspecified: Secondary | ICD-10-CM | POA: Diagnosis not present

## 2021-01-05 DIAGNOSIS — G301 Alzheimer's disease with late onset: Secondary | ICD-10-CM | POA: Diagnosis not present

## 2021-01-05 DIAGNOSIS — E1140 Type 2 diabetes mellitus with diabetic neuropathy, unspecified: Secondary | ICD-10-CM | POA: Diagnosis not present

## 2021-01-05 DIAGNOSIS — I10 Essential (primary) hypertension: Secondary | ICD-10-CM | POA: Diagnosis not present

## 2021-01-05 DIAGNOSIS — E1122 Type 2 diabetes mellitus with diabetic chronic kidney disease: Secondary | ICD-10-CM | POA: Diagnosis not present

## 2021-01-05 DIAGNOSIS — J449 Chronic obstructive pulmonary disease, unspecified: Secondary | ICD-10-CM | POA: Diagnosis not present

## 2021-01-05 DIAGNOSIS — M810 Age-related osteoporosis without current pathological fracture: Secondary | ICD-10-CM | POA: Diagnosis not present

## 2021-01-05 DIAGNOSIS — M6281 Muscle weakness (generalized): Secondary | ICD-10-CM | POA: Diagnosis not present

## 2021-01-05 DIAGNOSIS — I7123 Aneurysm of the descending thoracic aorta, without rupture: Secondary | ICD-10-CM | POA: Diagnosis not present

## 2021-01-12 DIAGNOSIS — E559 Vitamin D deficiency, unspecified: Secondary | ICD-10-CM | POA: Diagnosis not present

## 2021-01-12 DIAGNOSIS — I10 Essential (primary) hypertension: Secondary | ICD-10-CM | POA: Diagnosis not present

## 2021-01-12 DIAGNOSIS — E119 Type 2 diabetes mellitus without complications: Secondary | ICD-10-CM | POA: Diagnosis not present

## 2021-01-12 DIAGNOSIS — E039 Hypothyroidism, unspecified: Secondary | ICD-10-CM | POA: Diagnosis not present

## 2021-01-19 DIAGNOSIS — G301 Alzheimer's disease with late onset: Secondary | ICD-10-CM | POA: Diagnosis not present

## 2021-01-19 DIAGNOSIS — J309 Allergic rhinitis, unspecified: Secondary | ICD-10-CM | POA: Diagnosis not present

## 2021-02-02 DIAGNOSIS — J449 Chronic obstructive pulmonary disease, unspecified: Secondary | ICD-10-CM | POA: Diagnosis not present

## 2021-02-02 DIAGNOSIS — E1122 Type 2 diabetes mellitus with diabetic chronic kidney disease: Secondary | ICD-10-CM | POA: Diagnosis not present

## 2021-02-02 DIAGNOSIS — M1991 Primary osteoarthritis, unspecified site: Secondary | ICD-10-CM | POA: Diagnosis not present

## 2021-02-02 DIAGNOSIS — I10 Essential (primary) hypertension: Secondary | ICD-10-CM | POA: Diagnosis not present

## 2021-02-02 DIAGNOSIS — G301 Alzheimer's disease with late onset: Secondary | ICD-10-CM | POA: Diagnosis not present

## 2021-02-03 ENCOUNTER — Ambulatory Visit: Payer: Medicare Other | Admitting: Psychiatry

## 2021-02-03 DIAGNOSIS — N390 Urinary tract infection, site not specified: Secondary | ICD-10-CM | POA: Diagnosis not present

## 2021-02-11 ENCOUNTER — Other Ambulatory Visit: Payer: Medicare Other

## 2021-02-16 DIAGNOSIS — G301 Alzheimer's disease with late onset: Secondary | ICD-10-CM | POA: Diagnosis not present

## 2021-02-16 DIAGNOSIS — J449 Chronic obstructive pulmonary disease, unspecified: Secondary | ICD-10-CM | POA: Diagnosis not present

## 2021-03-01 DIAGNOSIS — M810 Age-related osteoporosis without current pathological fracture: Secondary | ICD-10-CM | POA: Diagnosis not present

## 2021-03-01 DIAGNOSIS — M1991 Primary osteoarthritis, unspecified site: Secondary | ICD-10-CM | POA: Diagnosis not present

## 2021-03-02 DIAGNOSIS — G301 Alzheimer's disease with late onset: Secondary | ICD-10-CM | POA: Diagnosis not present

## 2021-03-02 DIAGNOSIS — I10 Essential (primary) hypertension: Secondary | ICD-10-CM | POA: Diagnosis not present

## 2021-03-02 DIAGNOSIS — M6281 Muscle weakness (generalized): Secondary | ICD-10-CM | POA: Diagnosis not present

## 2021-03-28 DIAGNOSIS — I10 Essential (primary) hypertension: Secondary | ICD-10-CM | POA: Diagnosis not present

## 2021-03-28 DIAGNOSIS — M6281 Muscle weakness (generalized): Secondary | ICD-10-CM | POA: Diagnosis not present

## 2021-03-30 DIAGNOSIS — K219 Gastro-esophageal reflux disease without esophagitis: Secondary | ICD-10-CM | POA: Diagnosis not present

## 2021-03-30 DIAGNOSIS — E118 Type 2 diabetes mellitus with unspecified complications: Secondary | ICD-10-CM | POA: Diagnosis not present

## 2021-03-30 DIAGNOSIS — M1991 Primary osteoarthritis, unspecified site: Secondary | ICD-10-CM | POA: Diagnosis not present

## 2021-04-20 DIAGNOSIS — I10 Essential (primary) hypertension: Secondary | ICD-10-CM | POA: Diagnosis not present

## 2021-04-20 DIAGNOSIS — M6281 Muscle weakness (generalized): Secondary | ICD-10-CM | POA: Diagnosis not present

## 2021-04-20 DIAGNOSIS — E1140 Type 2 diabetes mellitus with diabetic neuropathy, unspecified: Secondary | ICD-10-CM | POA: Diagnosis not present

## 2021-04-20 DIAGNOSIS — J449 Chronic obstructive pulmonary disease, unspecified: Secondary | ICD-10-CM | POA: Diagnosis not present

## 2021-05-04 DIAGNOSIS — R300 Dysuria: Secondary | ICD-10-CM | POA: Diagnosis not present

## 2021-05-05 DIAGNOSIS — N390 Urinary tract infection, site not specified: Secondary | ICD-10-CM | POA: Diagnosis not present

## 2021-05-11 DIAGNOSIS — K219 Gastro-esophageal reflux disease without esophagitis: Secondary | ICD-10-CM | POA: Diagnosis not present

## 2021-05-11 DIAGNOSIS — R300 Dysuria: Secondary | ICD-10-CM | POA: Diagnosis not present

## 2021-05-24 DIAGNOSIS — I739 Peripheral vascular disease, unspecified: Secondary | ICD-10-CM | POA: Diagnosis not present

## 2021-05-24 DIAGNOSIS — J449 Chronic obstructive pulmonary disease, unspecified: Secondary | ICD-10-CM | POA: Diagnosis not present

## 2021-05-25 DIAGNOSIS — J449 Chronic obstructive pulmonary disease, unspecified: Secondary | ICD-10-CM | POA: Diagnosis not present

## 2021-05-25 DIAGNOSIS — E118 Type 2 diabetes mellitus with unspecified complications: Secondary | ICD-10-CM | POA: Diagnosis not present

## 2021-05-25 DIAGNOSIS — I10 Essential (primary) hypertension: Secondary | ICD-10-CM | POA: Diagnosis not present

## 2021-06-14 DIAGNOSIS — L603 Nail dystrophy: Secondary | ICD-10-CM | POA: Diagnosis not present

## 2021-06-14 DIAGNOSIS — I739 Peripheral vascular disease, unspecified: Secondary | ICD-10-CM | POA: Diagnosis not present

## 2021-06-22 DIAGNOSIS — M1991 Primary osteoarthritis, unspecified site: Secondary | ICD-10-CM | POA: Diagnosis not present

## 2021-06-22 DIAGNOSIS — K219 Gastro-esophageal reflux disease without esophagitis: Secondary | ICD-10-CM | POA: Diagnosis not present

## 2021-06-22 DIAGNOSIS — M6281 Muscle weakness (generalized): Secondary | ICD-10-CM | POA: Diagnosis not present

## 2021-06-29 DIAGNOSIS — J441 Chronic obstructive pulmonary disease with (acute) exacerbation: Secondary | ICD-10-CM | POA: Diagnosis not present

## 2021-06-29 DIAGNOSIS — I739 Peripheral vascular disease, unspecified: Secondary | ICD-10-CM | POA: Diagnosis not present

## 2021-07-06 DIAGNOSIS — J441 Chronic obstructive pulmonary disease with (acute) exacerbation: Secondary | ICD-10-CM | POA: Diagnosis not present

## 2021-07-08 DIAGNOSIS — H5213 Myopia, bilateral: Secondary | ICD-10-CM | POA: Diagnosis not present

## 2021-07-13 DIAGNOSIS — J449 Chronic obstructive pulmonary disease, unspecified: Secondary | ICD-10-CM | POA: Diagnosis not present

## 2021-07-20 DIAGNOSIS — E118 Type 2 diabetes mellitus with unspecified complications: Secondary | ICD-10-CM | POA: Diagnosis not present

## 2021-07-20 DIAGNOSIS — I739 Peripheral vascular disease, unspecified: Secondary | ICD-10-CM | POA: Diagnosis not present

## 2021-07-20 DIAGNOSIS — J441 Chronic obstructive pulmonary disease with (acute) exacerbation: Secondary | ICD-10-CM | POA: Diagnosis not present

## 2021-07-20 DIAGNOSIS — I10 Essential (primary) hypertension: Secondary | ICD-10-CM | POA: Diagnosis not present

## 2021-07-20 DIAGNOSIS — J449 Chronic obstructive pulmonary disease, unspecified: Secondary | ICD-10-CM | POA: Diagnosis not present

## 2021-07-22 DIAGNOSIS — H524 Presbyopia: Secondary | ICD-10-CM | POA: Diagnosis not present

## 2021-09-07 DIAGNOSIS — I7123 Aneurysm of the descending thoracic aorta, without rupture: Secondary | ICD-10-CM | POA: Diagnosis not present

## 2021-09-07 DIAGNOSIS — K219 Gastro-esophageal reflux disease without esophagitis: Secondary | ICD-10-CM | POA: Diagnosis not present

## 2021-09-07 DIAGNOSIS — M810 Age-related osteoporosis without current pathological fracture: Secondary | ICD-10-CM | POA: Diagnosis not present

## 2021-09-14 ENCOUNTER — Ambulatory Visit: Payer: Medicare Other

## 2021-09-15 DIAGNOSIS — M810 Age-related osteoporosis without current pathological fracture: Secondary | ICD-10-CM | POA: Diagnosis not present

## 2021-09-15 DIAGNOSIS — I7123 Aneurysm of the descending thoracic aorta, without rupture: Secondary | ICD-10-CM | POA: Diagnosis not present

## 2021-09-15 DIAGNOSIS — E1169 Type 2 diabetes mellitus with other specified complication: Secondary | ICD-10-CM | POA: Diagnosis not present

## 2021-09-15 DIAGNOSIS — K219 Gastro-esophageal reflux disease without esophagitis: Secondary | ICD-10-CM | POA: Diagnosis not present

## 2021-10-05 DIAGNOSIS — I10 Essential (primary) hypertension: Secondary | ICD-10-CM | POA: Diagnosis not present

## 2021-10-05 DIAGNOSIS — M6281 Muscle weakness (generalized): Secondary | ICD-10-CM | POA: Diagnosis not present

## 2021-10-05 DIAGNOSIS — E1169 Type 2 diabetes mellitus with other specified complication: Secondary | ICD-10-CM | POA: Diagnosis not present

## 2021-10-12 DIAGNOSIS — E1169 Type 2 diabetes mellitus with other specified complication: Secondary | ICD-10-CM | POA: Diagnosis not present

## 2021-10-12 DIAGNOSIS — I10 Essential (primary) hypertension: Secondary | ICD-10-CM | POA: Diagnosis not present

## 2021-10-12 DIAGNOSIS — E782 Mixed hyperlipidemia: Secondary | ICD-10-CM | POA: Diagnosis not present

## 2021-10-12 DIAGNOSIS — I7123 Aneurysm of the descending thoracic aorta, without rupture: Secondary | ICD-10-CM | POA: Diagnosis not present

## 2021-11-02 DIAGNOSIS — K219 Gastro-esophageal reflux disease without esophagitis: Secondary | ICD-10-CM | POA: Diagnosis not present

## 2021-11-02 DIAGNOSIS — J449 Chronic obstructive pulmonary disease, unspecified: Secondary | ICD-10-CM | POA: Diagnosis not present

## 2021-11-02 DIAGNOSIS — E1169 Type 2 diabetes mellitus with other specified complication: Secondary | ICD-10-CM | POA: Diagnosis not present

## 2021-11-16 ENCOUNTER — Encounter: Payer: Self-pay | Admitting: *Deleted

## 2021-11-16 DIAGNOSIS — I7123 Aneurysm of the descending thoracic aorta, without rupture: Secondary | ICD-10-CM | POA: Diagnosis not present

## 2021-11-16 DIAGNOSIS — E1169 Type 2 diabetes mellitus with other specified complication: Secondary | ICD-10-CM | POA: Diagnosis not present

## 2021-12-07 DIAGNOSIS — M6281 Muscle weakness (generalized): Secondary | ICD-10-CM | POA: Diagnosis not present

## 2021-12-07 DIAGNOSIS — I10 Essential (primary) hypertension: Secondary | ICD-10-CM | POA: Diagnosis not present

## 2021-12-07 DIAGNOSIS — I7123 Aneurysm of the descending thoracic aorta, without rupture: Secondary | ICD-10-CM | POA: Diagnosis not present

## 2021-12-19 ENCOUNTER — Other Ambulatory Visit: Payer: Self-pay | Admitting: *Deleted

## 2021-12-19 DIAGNOSIS — I714 Abdominal aortic aneurysm, without rupture, unspecified: Secondary | ICD-10-CM

## 2021-12-28 DIAGNOSIS — E1169 Type 2 diabetes mellitus with other specified complication: Secondary | ICD-10-CM | POA: Diagnosis not present

## 2021-12-28 DIAGNOSIS — E1151 Type 2 diabetes mellitus with diabetic peripheral angiopathy without gangrene: Secondary | ICD-10-CM | POA: Diagnosis not present

## 2021-12-29 DIAGNOSIS — M6281 Muscle weakness (generalized): Secondary | ICD-10-CM | POA: Diagnosis not present

## 2021-12-29 DIAGNOSIS — I7123 Aneurysm of the descending thoracic aorta, without rupture: Secondary | ICD-10-CM | POA: Diagnosis not present

## 2021-12-29 DIAGNOSIS — I10 Essential (primary) hypertension: Secondary | ICD-10-CM | POA: Diagnosis not present

## 2022-01-04 ENCOUNTER — Ambulatory Visit (INDEPENDENT_AMBULATORY_CARE_PROVIDER_SITE_OTHER): Payer: Medicare Other | Admitting: Physician Assistant

## 2022-01-04 ENCOUNTER — Ambulatory Visit (HOSPITAL_COMMUNITY)
Admission: RE | Admit: 2022-01-04 | Discharge: 2022-01-04 | Disposition: A | Discharge status: Home / Self Care | Payer: Medicare Other | Source: Ambulatory Visit | Attending: Vascular Surgery | Admitting: Vascular Surgery

## 2022-01-04 VITALS — BP 116/72 | HR 61 | Temp 97.5°F | Ht <= 58 in | Wt 109.0 lb

## 2022-01-04 DIAGNOSIS — I714 Abdominal aortic aneurysm, without rupture, unspecified: Secondary | ICD-10-CM

## 2022-01-04 NOTE — Progress Notes (Signed)
Office Note     CC:  follow up Requesting Provider:  Verl Blalock, NP  HPI: Kylie Hurst is a 76 y.o. (09-20-1945) female who presents for surveillance of abdominal aortic aneurysm.  She was last seen in November 2022 at which time her AAA measured 4.3 cm.  She denies any new or changing abdominal or back pain.  She is ambulatory without any claudication, rest pain, or tissue loss.  She lives at an assisted living facility in [place].  She is accompanied today by her niece.  Past Medical History:  Diagnosis Date   Aortic aneurysm ([place]) 2011   3.83m   Arthritis    Back spasm    Bulging discs    Chronic back pain    Dementia (HCC)    Depression    Diabetes mellitus without complication (HCC)    fasting 90-110   Emphysema    Environmental and seasonal allergies    Hyperlipidemia    Hypertension    Neuromuscular disorder ([place]    Onycholysis of toenail    Osteoporosis    Ovarian cyst 06/01/2009   5 cm   Restless legs    Spigelian hernia    left ventral   Wears glasses     Past Surgical History:  Procedure Laterality Date   ANTERIOR CERVICAL DECOMP/DISCECTOMY FUSION N/A 12/18/2012   Procedure: ANTERIOR CERVICAL DECOMPRESSION/DISCECTOMY FUSION 1 LEVEL;  Surgeon: MSinclair Ship MD;  [facility]  Service: Orthopedics;  Laterality: N/A;  Anterior cervical decompression fusion, cervical 7 - thoracic 1 with instrumentation, allograft   COLONOSCOPY     INSERTION OF MESH Left 05/14/2014   Procedure: INSERTION OF MESH;  Surgeon: MDonnie Mesa MD;  [facility];  Service: General;  Laterality: Left;   NOsageHERNIA  05/14/2014   VENTRAL HERNIA REPAIR Left 05/14/2014   Procedure: ATTEMPTED LAPAROSCOPIC REPAIR LEFT SPIGLIAN HERINA CONVERTED TO OPEN REPAIR;  Surgeon: MDonnie Mesa MD;  [facility] [facility]  Service: General;  Laterality: Left;    Social History   Socioeconomic History   Marital status: Divorced    Spouse name: Not on  file   Number of children: Not on file   Years of education: Not on file   Highest education level: Not on file  Occupational History   Not on file  Tobacco Use   Smoking status: Former    Packs/day: 1.00    Years: 52.00    Total pack years: 52.00    Types: Cigarettes   Smokeless tobacco: Never   Tobacco comments:    Currently on Chantix  Vaping Use   Vaping Use: Never used  Substance and Sexual Activity   Alcohol use: No   Drug use: No   Sexual activity: Not on file  Other Topics Concern   Not on file  Social History Narrative   Not on file   Social Determinants of Health   Financial Resource Strain: Not on file  Food Insecurity: Not on file  Transportation Needs: Not on file  Physical Activity: Not on file  Stress: Not on file  Social Connections: Not on file  Intimate Partner Violence: Not on file    Family History  Problem Relation Age of Onset   Diabetes Mother    Cancer Sister    Epilepsy Brother     Current Outpatient Medications  Medication Sig Dispense Refill   ACCU-CHEK AVIVA PLUS test strip USE AS DIRECTED TO TEST BLOOD SUGAR DAILY  albuterol (VENTOLIN HFA) 108 (90 Base) MCG/ACT inhaler Inhale 1 puff into the lungs every 4 (four) hours as needed for shortness of breath or wheezing.     ANORO ELLIPTA 62.5-25 MCG/INH AEPB Inhale 1 puff into the lungs in the morning.     aspirin (ASPIRIN CHILDRENS) 81 MG chewable tablet Chew 1 tablet (81 mg total) by mouth daily. 30 tablet 0   b complex vitamins tablet Take 1 tablet by mouth daily.     busPIRone (BUSPAR) 7.5 MG tablet Take 7.5 mg by mouth 2 (two) times daily.     cholecalciferol (VITAMIN D3) 25 MCG (1000 UNIT) tablet Take 2,000 Units by mouth daily.     denosumab (PROLIA) 60 MG/ML SOSY injection Inject 60 mg into the skin every 6 (six) months.     escitalopram (LEXAPRO) 10 MG tablet Take 10 mg by mouth daily.     gabapentin (NEURONTIN) 300 MG capsule Take 300 mg by mouth at bedtime.      hydrochlorothiazide (HYDRODIURIL) 12.5 MG tablet Take 12.5 mg by mouth daily.     KLOR-CON M20 20 MEQ tablet Take 20 mEq by mouth every evening.     lisinopril (PRINIVIL,ZESTRIL) 5 MG tablet Take 5 mg by mouth at bedtime.      polyethylene glycol-electrolytes (NULYTELY) 420 g solution See admin instructions. (Patient not taking: No sig reported)     pravastatin (PRAVACHOL) 20 MG tablet Take 20 mg by mouth at bedtime.      senna (SENOKOT) 8.6 MG TABS tablet Take 2 tablets by mouth 2 (two) times daily.     SHINGRIX injection  (Patient not taking: No sig reported)     sodium chloride (OCEAN) 0.65 % SOLN nasal spray Place 2 sprays into both nostrils at bedtime.     tetrahydrozoline 0.05 % ophthalmic solution Place 1 drop into both eyes 3 (three) times daily.     No current facility-administered medications for this visit.    Allergies  Allergen Reactions   Other Itching and Other (See Comments)    Seasonal Allergies- Itchy eyes, runny nose, etc..     REVIEW OF SYSTEMS:   '[X]'$  denotes positive finding, '[ ]'$  denotes negative finding Cardiac  Comments:  Chest pain or chest pressure:    Shortness of breath upon exertion:    Short of breath when lying flat:    Irregular heart rhythm:        Vascular    Pain in calf, thigh, or hip brought on by ambulation:    Pain in feet at night that wakes you up from your sleep:     Blood clot in your veins:    Leg swelling:         Pulmonary    Oxygen at home:    Productive cough:     Wheezing:         Neurologic    Sudden weakness in arms or legs:     Sudden numbness in arms or legs:     Sudden onset of difficulty speaking or slurred speech:    Temporary loss of vision in one eye:     Problems with dizziness:         Gastrointestinal    Blood in stool:     Vomited blood:         Genitourinary    Burning when urinating:     Blood in urine:        Psychiatric    Major depression:  Hematologic    Bleeding problems:    Problems  with blood clotting too easily:        Skin    Rashes or ulcers:        Constitutional    Fever or chills:      PHYSICAL EXAMINATION:  Vitals:   01/04/22 0925  BP: 116/72  Pulse: 61  Temp: (!) 97.5 F (36.4 C)  TempSrc: Temporal  SpO2: 92%  Weight: 109 lb (49.4 kg)  Height: '4\' 9"'$  (1.448 m)    General:  WDWN in NAD; vital signs documented above Gait: Not observed HENT: WNL, normocephalic Pulmonary: normal non-labored breathing , without Rales, rhonchi,  wheezing Cardiac: regular HR Abdomen: soft, NT, no masses Skin: without rashes Vascular Exam/Pulses: Symmetrical femoral pulses Extremities: without ischemic changes, without Gangrene , without cellulitis; without open wounds;  Musculoskeletal: no muscle wasting or atrophy  Neurologic: A&O X 3;  No focal weakness or paresthesias are detected Psychiatric:  The pt has Normal affect.   Non-Invasive Vascular Imaging:   AAA 4.95 cm at maximum diameter    ASSESSMENT/PLAN:: 76 y.o. female here for surveillance of asymptomatic abdominal aortic aneurysm  -Based on duplex, AAA has increased in size and is now measuring 4.95 cm.  Aneurysm remains asymptomatic.  She has no history of CKD and a normal creatinine 1 year ago.  We will check a CTA abdomen and pelvis in the next 2 to 3 weeks and patient will follow-up with Dr. Donzetta Matters to review those results.  The patient and her niece are aware she may require AAA repair.  They are agreeable to proceed with workup.   Dagoberto Ligas, PA-C [facility] [phone]  Clinic MD:   Donzetta Matters

## 2022-01-16 ENCOUNTER — Other Ambulatory Visit: Payer: Self-pay

## 2022-01-16 DIAGNOSIS — I714 Abdominal aortic aneurysm, without rupture, unspecified: Secondary | ICD-10-CM

## 2022-01-18 ENCOUNTER — Ambulatory Visit: Payer: Medicare Other | Admitting: Vascular Surgery

## 2022-01-18 ENCOUNTER — Other Ambulatory Visit: Payer: Self-pay | Admitting: Radiology

## 2022-01-18 ENCOUNTER — Other Ambulatory Visit: Payer: Medicare Other

## 2022-01-18 DIAGNOSIS — I739 Peripheral vascular disease, unspecified: Secondary | ICD-10-CM | POA: Diagnosis not present

## 2022-01-18 DIAGNOSIS — G301 Alzheimer's disease with late onset: Secondary | ICD-10-CM | POA: Diagnosis not present

## 2022-01-18 DIAGNOSIS — I10 Essential (primary) hypertension: Secondary | ICD-10-CM | POA: Diagnosis not present

## 2022-01-18 DIAGNOSIS — E1151 Type 2 diabetes mellitus with diabetic peripheral angiopathy without gangrene: Secondary | ICD-10-CM | POA: Diagnosis not present

## 2022-01-19 DIAGNOSIS — G309 Alzheimer's disease, unspecified: Secondary | ICD-10-CM | POA: Diagnosis not present

## 2022-01-31 ENCOUNTER — Ambulatory Visit (INDEPENDENT_AMBULATORY_CARE_PROVIDER_SITE_OTHER): Payer: Medicare Other

## 2022-01-31 DIAGNOSIS — I714 Abdominal aortic aneurysm, without rupture, unspecified: Secondary | ICD-10-CM

## 2022-01-31 DIAGNOSIS — I350 Nonrheumatic aortic (valve) stenosis: Secondary | ICD-10-CM | POA: Diagnosis not present

## 2022-01-31 LAB — I-STAT CREATININE (MANUAL ENTRY): Creatinine, Ser: 1.1 (ref 0.50–1.10)

## 2022-01-31 MED ORDER — IOHEXOL 350 MG/ML SOLN
100.0000 mL | Freq: Once | INTRAVENOUS | Status: AC | PRN
  Administered 2022-01-31: 100 mL via INTRAVENOUS

## 2022-02-08 ENCOUNTER — Ambulatory Visit: Payer: Medicare Other | Admitting: Vascular Surgery

## 2022-02-08 ENCOUNTER — Ambulatory Visit (INDEPENDENT_AMBULATORY_CARE_PROVIDER_SITE_OTHER): Payer: Medicare Other | Admitting: Vascular Surgery

## 2022-02-08 ENCOUNTER — Other Ambulatory Visit: Payer: Medicare Other

## 2022-02-08 ENCOUNTER — Encounter: Payer: Self-pay | Admitting: Vascular Surgery

## 2022-02-08 VITALS — BP 126/73 | HR 53 | Temp 98.4°F | Resp 20 | Ht <= 58 in | Wt 112.0 lb

## 2022-02-08 DIAGNOSIS — I7143 Infrarenal abdominal aortic aneurysm, without rupture: Secondary | ICD-10-CM

## 2022-02-08 NOTE — Progress Notes (Signed)
Patient ID: Kylie Hurst, female   DOB: 24-May-1945, 77 y.o.   MRN: [id number]  Reason for Consult: Follow-up   Referred by Verl Blalock, NP  Subjective:     HPI:  Kylie Hurst is a 76 y.o. female has a history of abdominal indicators and.  She is unsure that any family members had aneurysms.  She was a longtime smoker but quit a couple years ago.  No new back or abdominal pain.  She is here today to discuss results of recent CT scan.  Past Medical History:  Diagnosis Date   Aortic aneurysm ([place]) 2011   3.51m   Arthritis    Back spasm    Bulging discs    Chronic back pain    Dementia (HCC)    Depression    Diabetes mellitus without complication (HCC)    fasting 90-110   Emphysema    Environmental and seasonal allergies    Hyperlipidemia    Hypertension    Neuromuscular disorder (HCC)    Onycholysis of toenail    Osteoporosis    Ovarian cyst 06/01/2009   5 cm   Restless legs    Spigelian hernia    left ventral   Wears glasses    Family History  Problem Relation Age of Onset   Diabetes Mother    Cancer Sister    Epilepsy Brother    Past Surgical History:  Procedure Laterality Date   ANTERIOR CERVICAL DECOMP/DISCECTOMY FUSION N/A 12/18/2012   Procedure: ANTERIOR CERVICAL DECOMPRESSION/DISCECTOMY FUSION 1 LEVEL;  Surgeon: MSinclair Ship MD;  [place] [facility]  Service: Orthopedics;  Laterality: N/A;  Anterior cervical decompression fusion, cervical 7 - thoracic 1 with instrumentation, allograft   COLONOSCOPY     INSERTION OF MESH Left 05/14/2014   Procedure: INSERTION OF MESH;  Surgeon: MDonnie Mesa MD;  [facility]  Service: General;  Laterality: Left;   NECK SURGERY     SPIGELIAN HERNIA  05/14/2014   VENTRAL HERNIA REPAIR Left 05/14/2014   Procedure: ATTEMPTED LAPAROSCOPIC REPAIR LEFT SPIGLIAN HERINA CONVERTED TO OPEN REPAIR;  Surgeon: MDonnie Mesa MD;  [place] [facility]  Service: General;  Laterality: Left;    Short Social History:   Social History   Tobacco Use   Smoking status: Former    Packs/day: 1.00    Years: 52.00    Total pack years: 52.00    Types: Cigarettes   Smokeless tobacco: Never   Tobacco comments:    Currently on Chantix  Substance Use Topics   Alcohol use: No    Allergies  Allergen Reactions   Other Itching and Other (See Comments)    Seasonal Allergies- Itchy eyes, runny nose, etc..    Current Outpatient Medications  Medication Sig Dispense Refill   ACCU-CHEK AVIVA PLUS test strip USE AS DIRECTED TO TEST BLOOD SUGAR DAILY     albuterol (VENTOLIN HFA) 108 (90 Base) MCG/ACT inhaler Inhale 1 puff into the lungs every 4 (four) hours as needed for shortness of breath or wheezing.     ANORO ELLIPTA 62.5-25 MCG/INH AEPB Inhale 1 puff into the lungs in the morning.     aspirin (ASPIRIN CHILDRENS) 81 MG chewable tablet Chew 1 tablet (81 mg total) by mouth daily. 30 tablet 0   b complex vitamins tablet Take 1 tablet by mouth daily.     busPIRone (BUSPAR) 7.5 MG tablet Take 7.5 mg by mouth 2 (two) times daily.     cholecalciferol (VITAMIN D3) 25 MCG (  1000 UNIT) tablet Take 2,000 Units by mouth daily.     denosumab (PROLIA) 60 MG/ML SOSY injection Inject 60 mg into the skin every 6 (six) months.     escitalopram (LEXAPRO) 10 MG tablet Take 10 mg by mouth daily.     gabapentin (NEURONTIN) 300 MG capsule Take 300 mg by mouth at bedtime.     hydrochlorothiazide (HYDRODIURIL) 12.5 MG tablet Take 12.5 mg by mouth daily.     KLOR-CON M20 20 MEQ tablet Take 20 mEq by mouth every evening.     lisinopril (PRINIVIL,ZESTRIL) 5 MG tablet Take 5 mg by mouth at bedtime.      polyethylene glycol-electrolytes (NULYTELY) 420 g solution See admin instructions.     pravastatin (PRAVACHOL) 20 MG tablet Take 20 mg by mouth at bedtime.      senna (SENOKOT) 8.6 MG TABS tablet Take 2 tablets by mouth 2 (two) times daily.     SHINGRIX injection      sodium chloride (OCEAN) 0.65 % SOLN nasal spray Place 2 sprays into both  nostrils at bedtime.     tetrahydrozoline 0.05 % ophthalmic solution Place 1 drop into both eyes 3 (three) times daily.     No current facility-administered medications for this visit.    Review of Systems  Constitutional:  Constitutional negative. HENT: HENT negative.  Eyes: Eyes negative.  Respiratory: Respiratory negative.  Cardiovascular: Cardiovascular negative.  GI: Gastrointestinal negative.  Musculoskeletal: Musculoskeletal negative.  Skin: Skin negative.  Neurological: Neurological negative. Hematologic: Hematologic/lymphatic negative.  Psychiatric: Psychiatric negative.        Objective:  Objective   Vitals:   02/08/22 1551  BP: 126/73  Pulse: (!) 53  Resp: 20  Temp: 98.4 F (36.9 C)  SpO2: 90%  Weight: 112 lb (50.8 kg)  Height: '4\' 9"'$  (1.448 m)   Body mass index is 24.24 kg/m.  Physical Exam HENT:     Head: Normocephalic.     Nose: Nose normal.     Mouth/Throat:     Mouth: Mucous membranes are moist.  Eyes:     Pupils: Pupils are equal, round, and reactive to light.  Cardiovascular:     Rate and Rhythm: Normal rate.     Pulses:          Popliteal pulses are 2+ on the right side and 3+ on the left side.  Pulmonary:     Effort: Pulmonary effort is normal.  Abdominal:     General: Abdomen is flat.  Musculoskeletal:        General: Normal range of motion.     Right lower leg: No edema.     Left lower leg: No edema.  Skin:    General: Skin is warm.     Capillary Refill: Capillary refill takes less than 2 seconds.  Neurological:     General: No focal deficit present.     Mental Status: She is alert.  Psychiatric:        Mood and Affect: Mood normal.        Thought Content: Thought content normal.     Data: CTA IMPRESSION: VASCULAR   1. Stable fusiform infrarenal abdominal aortic aneurysm measuring up to 4.4 cm. Recommend follow-up every 12 months and vascular consultation. This recommendation follows ACR consensus guidelines: White  Paper of the ACR Incidental Findings Committee II on Vascular Findings. J Am Coll Radiol 2013; 10:789-794. 2.  Aortic Atherosclerosis (ICD10-I70.0).     Assessment/Plan:    77 year old female with known abdominal aortic  aneurysm recently measured by CT scan of 4.4 cm although I have a closer to 4.8 cm which is closer to the sizing by duplex.  We will get her to follow-up in 6 more months with repeat duplex and also evaluate her popliteal arteries given that she has an enlarged left popliteal artery by physical exam.  We discussed the signs and symptoms of rupture upper abdominal aortic aneurysm and that it is low but not 0 risk at this time and short of this I will see her back in 6 months.     Waynetta Sandy MD [facility]

## 2022-02-15 ENCOUNTER — Other Ambulatory Visit: Payer: Self-pay

## 2022-02-15 DIAGNOSIS — Z9183 Wandering in diseases classified elsewhere: Secondary | ICD-10-CM | POA: Diagnosis not present

## 2022-02-15 DIAGNOSIS — I7143 Infrarenal abdominal aortic aneurysm, without rupture: Secondary | ICD-10-CM

## 2022-02-15 DIAGNOSIS — G309 Alzheimer's disease, unspecified: Secondary | ICD-10-CM | POA: Diagnosis not present

## 2022-02-15 DIAGNOSIS — I714 Abdominal aortic aneurysm, without rupture, unspecified: Secondary | ICD-10-CM

## 2022-02-23 ENCOUNTER — Telehealth: Payer: Self-pay

## 2022-02-23 NOTE — Patient Outreach (Signed)
  Care Coordination   Initial Visit Note   02/23/2022 Name: Kylie Hurst MRN: [id number] DOB: 08-24-1945  Kylie Hurst is a 77 y.o. year old female who sees Patient, No Pcp Per for primary care. I spoke with  Kylie Hurst by phone today.  What matters to the patients health and wellness today?  Placed call to patient and spoke with niece who is HCPOA.  Niece reports patient no longer going to [facility].     SDOH assessments and interventions completed:  No     Care Coordination Interventions:  No, not indicated   Follow up plan: No further intervention required.   Encounter Outcome:  Pt. Visit Completed   Tomasa Rand, RN, BSN, CEN [place] Coordinator [phone]

## 2022-03-01 DIAGNOSIS — Z9183 Wandering in diseases classified elsewhere: Secondary | ICD-10-CM | POA: Diagnosis not present

## 2022-03-01 DIAGNOSIS — K219 Gastro-esophageal reflux disease without esophagitis: Secondary | ICD-10-CM | POA: Diagnosis not present

## 2022-03-01 DIAGNOSIS — E1151 Type 2 diabetes mellitus with diabetic peripheral angiopathy without gangrene: Secondary | ICD-10-CM | POA: Diagnosis not present

## 2022-03-01 DIAGNOSIS — J449 Chronic obstructive pulmonary disease, unspecified: Secondary | ICD-10-CM | POA: Diagnosis not present

## 2022-03-01 DIAGNOSIS — G309 Alzheimer's disease, unspecified: Secondary | ICD-10-CM | POA: Diagnosis not present

## 2022-03-29 DIAGNOSIS — J449 Chronic obstructive pulmonary disease, unspecified: Secondary | ICD-10-CM | POA: Diagnosis not present

## 2022-03-29 DIAGNOSIS — I10 Essential (primary) hypertension: Secondary | ICD-10-CM | POA: Diagnosis not present

## 2022-03-29 DIAGNOSIS — M6281 Muscle weakness (generalized): Secondary | ICD-10-CM | POA: Diagnosis not present

## 2022-03-29 DIAGNOSIS — G309 Alzheimer's disease, unspecified: Secondary | ICD-10-CM | POA: Diagnosis not present

## 2022-04-12 DIAGNOSIS — J441 Chronic obstructive pulmonary disease with (acute) exacerbation: Secondary | ICD-10-CM | POA: Diagnosis not present

## 2022-04-12 DIAGNOSIS — J309 Allergic rhinitis, unspecified: Secondary | ICD-10-CM | POA: Diagnosis not present

## 2022-04-19 DIAGNOSIS — E1151 Type 2 diabetes mellitus with diabetic peripheral angiopathy without gangrene: Secondary | ICD-10-CM | POA: Diagnosis not present

## 2022-04-19 DIAGNOSIS — J449 Chronic obstructive pulmonary disease, unspecified: Secondary | ICD-10-CM | POA: Diagnosis not present

## 2022-04-19 DIAGNOSIS — K219 Gastro-esophageal reflux disease without esophagitis: Secondary | ICD-10-CM | POA: Diagnosis not present

## 2022-04-19 DIAGNOSIS — G309 Alzheimer's disease, unspecified: Secondary | ICD-10-CM | POA: Diagnosis not present

## 2022-04-25 DIAGNOSIS — G894 Chronic pain syndrome: Secondary | ICD-10-CM | POA: Diagnosis not present

## 2022-04-25 DIAGNOSIS — I119 Hypertensive heart disease without heart failure: Secondary | ICD-10-CM | POA: Diagnosis not present

## 2022-04-26 DIAGNOSIS — G309 Alzheimer's disease, unspecified: Secondary | ICD-10-CM | POA: Diagnosis not present

## 2022-05-17 DIAGNOSIS — I712 Thoracic aortic aneurysm, without rupture, unspecified: Secondary | ICD-10-CM | POA: Diagnosis not present

## 2022-05-17 DIAGNOSIS — I119 Hypertensive heart disease without heart failure: Secondary | ICD-10-CM | POA: Diagnosis not present

## 2022-05-17 DIAGNOSIS — M810 Age-related osteoporosis without current pathological fracture: Secondary | ICD-10-CM | POA: Diagnosis not present

## 2022-05-17 DIAGNOSIS — J449 Chronic obstructive pulmonary disease, unspecified: Secondary | ICD-10-CM | POA: Diagnosis not present

## 2022-05-17 DIAGNOSIS — G309 Alzheimer's disease, unspecified: Secondary | ICD-10-CM | POA: Diagnosis not present

## 2022-05-17 DIAGNOSIS — J309 Allergic rhinitis, unspecified: Secondary | ICD-10-CM | POA: Diagnosis not present

## 2022-05-24 DIAGNOSIS — G894 Chronic pain syndrome: Secondary | ICD-10-CM | POA: Diagnosis not present

## 2022-05-24 DIAGNOSIS — G309 Alzheimer's disease, unspecified: Secondary | ICD-10-CM | POA: Diagnosis not present

## 2022-06-21 DIAGNOSIS — G894 Chronic pain syndrome: Secondary | ICD-10-CM | POA: Diagnosis not present

## 2022-06-21 DIAGNOSIS — G309 Alzheimer's disease, unspecified: Secondary | ICD-10-CM | POA: Diagnosis not present

## 2022-06-28 DIAGNOSIS — K219 Gastro-esophageal reflux disease without esophagitis: Secondary | ICD-10-CM | POA: Diagnosis not present

## 2022-06-28 DIAGNOSIS — E782 Mixed hyperlipidemia: Secondary | ICD-10-CM | POA: Diagnosis not present

## 2022-06-28 DIAGNOSIS — G8929 Other chronic pain: Secondary | ICD-10-CM | POA: Diagnosis not present

## 2022-06-28 DIAGNOSIS — J449 Chronic obstructive pulmonary disease, unspecified: Secondary | ICD-10-CM | POA: Diagnosis not present

## 2022-07-05 DIAGNOSIS — E782 Mixed hyperlipidemia: Secondary | ICD-10-CM | POA: Diagnosis not present

## 2022-07-05 DIAGNOSIS — E119 Type 2 diabetes mellitus without complications: Secondary | ICD-10-CM | POA: Diagnosis not present

## 2022-07-05 DIAGNOSIS — I10 Essential (primary) hypertension: Secondary | ICD-10-CM | POA: Diagnosis not present

## 2022-07-05 DIAGNOSIS — E559 Vitamin D deficiency, unspecified: Secondary | ICD-10-CM | POA: Diagnosis not present

## 2022-07-05 DIAGNOSIS — E039 Hypothyroidism, unspecified: Secondary | ICD-10-CM | POA: Diagnosis not present

## 2022-07-19 DIAGNOSIS — G8929 Other chronic pain: Secondary | ICD-10-CM | POA: Diagnosis not present

## 2022-07-19 DIAGNOSIS — E119 Type 2 diabetes mellitus without complications: Secondary | ICD-10-CM | POA: Diagnosis not present

## 2022-07-19 DIAGNOSIS — J309 Allergic rhinitis, unspecified: Secondary | ICD-10-CM | POA: Diagnosis not present

## 2022-07-19 DIAGNOSIS — E559 Vitamin D deficiency, unspecified: Secondary | ICD-10-CM | POA: Diagnosis not present

## 2022-07-19 DIAGNOSIS — G309 Alzheimer's disease, unspecified: Secondary | ICD-10-CM | POA: Diagnosis not present

## 2022-07-19 DIAGNOSIS — G4700 Insomnia, unspecified: Secondary | ICD-10-CM | POA: Diagnosis not present

## 2022-08-07 ENCOUNTER — Other Ambulatory Visit: Payer: Self-pay | Admitting: *Deleted

## 2022-08-08 DIAGNOSIS — J309 Allergic rhinitis, unspecified: Secondary | ICD-10-CM | POA: Diagnosis not present

## 2022-08-08 DIAGNOSIS — E119 Type 2 diabetes mellitus without complications: Secondary | ICD-10-CM | POA: Diagnosis not present

## 2022-08-08 DIAGNOSIS — E559 Vitamin D deficiency, unspecified: Secondary | ICD-10-CM | POA: Diagnosis not present

## 2022-08-16 DIAGNOSIS — J449 Chronic obstructive pulmonary disease, unspecified: Secondary | ICD-10-CM | POA: Diagnosis not present

## 2022-08-16 DIAGNOSIS — I739 Peripheral vascular disease, unspecified: Secondary | ICD-10-CM | POA: Diagnosis not present

## 2022-08-16 DIAGNOSIS — K219 Gastro-esophageal reflux disease without esophagitis: Secondary | ICD-10-CM | POA: Diagnosis not present

## 2022-08-16 DIAGNOSIS — G301 Alzheimer's disease with late onset: Secondary | ICD-10-CM | POA: Diagnosis not present

## 2022-08-23 ENCOUNTER — Ambulatory Visit (HOSPITAL_COMMUNITY)
Admission: RE | Admit: 2022-08-23 | Discharge: 2022-08-23 | Disposition: A | Discharge status: Home / Self Care | Payer: 59 | Source: Ambulatory Visit | Attending: Vascular Surgery | Admitting: Vascular Surgery

## 2022-08-23 ENCOUNTER — Encounter: Payer: Self-pay | Admitting: Physician Assistant

## 2022-08-23 ENCOUNTER — Ambulatory Visit (INDEPENDENT_AMBULATORY_CARE_PROVIDER_SITE_OTHER): Payer: 59 | Admitting: Physician Assistant

## 2022-08-23 ENCOUNTER — Ambulatory Visit (INDEPENDENT_AMBULATORY_CARE_PROVIDER_SITE_OTHER)
Admission: RE | Admit: 2022-08-23 | Discharge: 2022-08-23 | Disposition: A | Discharge status: Home / Self Care | Payer: 59 | Source: Ambulatory Visit | Attending: Vascular Surgery | Admitting: Vascular Surgery

## 2022-08-23 VITALS — BP 127/82 | HR 63 | Temp 98.0°F | Wt 116.0 lb

## 2022-08-23 DIAGNOSIS — I7143 Infrarenal abdominal aortic aneurysm, without rupture: Secondary | ICD-10-CM | POA: Diagnosis not present

## 2022-08-23 DIAGNOSIS — I714 Abdominal aortic aneurysm, without rupture, unspecified: Secondary | ICD-10-CM | POA: Diagnosis not present

## 2022-08-23 NOTE — Progress Notes (Signed)
Office Note     CC:  follow up Requesting Provider:  No ref. provider found  HPI: Kylie Hurst is a 77 y.o. (November 18, 1945) female who presents for today's visit with her niece for follow up of AAA. She is not the best historian. She says occasionally she has some abdominal pain but this happens very intermittently. She says she just associates it with being along time smoker and not taking care of herself. She also reports intermittent pains in her legs on ambulation but this does not happen every time. She is unable to really describe the pain to me.  However she denies pain at rest. No wounds. She ambulates without any assistance.    Past Medical History:  Diagnosis Date   Aortic aneurysm (HCC) 2011   3.46mm   Arthritis    Back spasm    Bulging discs    Chronic back pain    Dementia (HCC)    Depression    Diabetes mellitus without complication (HCC)    fasting 90-110   Emphysema    Environmental and seasonal allergies    Hyperlipidemia    Hypertension    Neuromuscular disorder (HCC)    Onycholysis of toenail    Osteoporosis    Ovarian cyst 06/01/2009   5 cm   Restless legs    Spigelian hernia    left ventral   Wears glasses     Past Surgical History:  Procedure Laterality Date   ANTERIOR CERVICAL DECOMP/DISCECTOMY FUSION N/A 12/18/2012   Procedure: ANTERIOR CERVICAL DECOMPRESSION/DISCECTOMY FUSION 1 LEVEL;  Surgeon: Emilee Hero, MD;  [facility];  Service: Orthopedics;  Laterality: N/A;  Anterior cervical decompression fusion, cervical 7 - thoracic 1 with instrumentation, allograft   COLONOSCOPY     INSERTION OF MESH Left 05/14/2014   Procedure: INSERTION OF MESH;  Surgeon: Manus Rudd, MD;  [facility];  Service: General;  Laterality: Left;   NECK SURGERY     SPIGELIAN HERNIA  05/14/2014   VENTRAL HERNIA REPAIR Left 05/14/2014   Procedure: ATTEMPTED LAPAROSCOPIC REPAIR LEFT SPIGLIAN HERINA CONVERTED TO OPEN REPAIR;  Surgeon: Manus Rudd, MD;   [facility];  Service: General;  Laterality: Left;    Social History   Socioeconomic History   Marital status: Divorced    Spouse name: Not on file   Number of children: Not on file   Years of education: Not on file   Highest education level: Not on file  Occupational History   Not on file  Tobacco Use   Smoking status: Former    Current packs/day: 1.00    Average packs/day: 1 pack/day for 52.0 years (52.0 ttl pk-yrs)    Types: Cigarettes   Smokeless tobacco: Never   Tobacco comments:    Currently on Chantix  Vaping Use   Vaping status: Never Used  Substance and Sexual Activity   Alcohol use: No   Drug use: No   Sexual activity: Not on file  Other Topics Concern   Not on file  Social History Narrative   Not on file   Social Determinants of Health   Financial Resource Strain: Not on file  Food Insecurity: No Food Insecurity (12/27/2020)   [facility]   Hunger Vital Sign    Worried About Running Out of Food in the Last Year: Never true    Ran Out of Food in the Last Year: Never true  Transportation Needs: Not on file  Physical Activity: Not on file  Stress: Not on file  Social Connections: Unknown (06/12/2021)   Received from [facility]   Social Network    Social Network: Not on file  Intimate Partner Violence: Unknown (05/04/2021)   Received from [facility]   HITS    Physically Hurt: Not on file    Insult or Talk Down To: Not on file    Threaten Physical Harm: Not on file    Scream or Curse: Not on file    Family History  Problem Relation Age of Onset   Diabetes Mother    Cancer Sister    Epilepsy Brother     Current Outpatient Medications  Medication Sig Dispense Refill   ACCU-CHEK AVIVA PLUS test strip USE AS DIRECTED TO TEST BLOOD SUGAR DAILY     albuterol (VENTOLIN HFA) 108 (90 Base) MCG/ACT inhaler Inhale 1 puff into the lungs every 4 (four) hours as needed for shortness of breath or wheezing.     ANORO ELLIPTA 62.5-25  MCG/INH AEPB Inhale 1 puff into the lungs in the morning.     aspirin (ASPIRIN CHILDRENS) 81 MG chewable tablet Chew 1 tablet (81 mg total) by mouth daily. 30 tablet 0   b complex vitamins tablet Take 1 tablet by mouth daily.     busPIRone (BUSPAR) 7.5 MG tablet Take 7.5 mg by mouth 2 (two) times daily.     cholecalciferol (VITAMIN D3) 25 MCG (1000 UNIT) tablet Take 2,000 Units by mouth daily.     denosumab (PROLIA) 60 MG/ML SOSY injection Inject 60 mg into the skin every 6 (six) months.     escitalopram (LEXAPRO) 10 MG tablet Take 10 mg by mouth daily.     gabapentin (NEURONTIN) 300 MG capsule Take 300 mg by mouth at bedtime.     hydrochlorothiazide (HYDRODIURIL) 12.5 MG tablet Take 12.5 mg by mouth daily.     KLOR-CON M20 20 MEQ tablet Take 20 mEq by mouth every evening.     lisinopril (PRINIVIL,ZESTRIL) 5 MG tablet Take 5 mg by mouth at bedtime.      polyethylene glycol-electrolytes (NULYTELY) 420 g solution See admin instructions.     pravastatin (PRAVACHOL) 20 MG tablet Take 20 mg by mouth at bedtime.      SHINGRIX injection      sodium chloride (OCEAN) 0.65 % SOLN nasal spray Place 2 sprays into both nostrils at bedtime.     tetrahydrozoline 0.05 % ophthalmic solution Place 1 drop into both eyes 3 (three) times daily.     No current facility-administered medications for this visit.    Allergies  Allergen Reactions   Other Itching and Other (See Comments)    Seasonal Allergies- Itchy eyes, runny nose, etc..     REVIEW OF SYSTEMS:   [X]  denotes positive finding, [ ]  denotes negative finding Cardiac  Comments:  Chest pain or chest pressure:    Shortness of breath upon exertion:    Short of breath when lying flat:    Irregular heart rhythm:        Vascular    Pain in calf, thigh, or hip brought on by ambulation:    Pain in feet at night that wakes you up from your sleep:     Blood clot in your veins:    Leg swelling:         Pulmonary    Oxygen at home:    Productive  cough:     Wheezing:         Neurologic  Sudden weakness in arms or legs:     Sudden numbness in arms or legs:     Sudden onset of difficulty speaking or slurred speech:    Temporary loss of vision in one eye:     Problems with dizziness:         Gastrointestinal    Blood in stool:     Vomited blood:         Genitourinary    Burning when urinating:     Blood in urine:        Psychiatric    Major depression:         Hematologic    Bleeding problems:    Problems with blood clotting too easily:        Skin    Rashes or ulcers:        Constitutional    Fever or chills:      PHYSICAL EXAMINATION:  Vitals:   08/23/22 0914  BP: 127/82  Pulse: 63  Temp: 98 F (36.7 C)  TempSrc: Temporal  SpO2: 92%  Weight: 116 lb (52.6 kg)    General:  WDWN in NAD; vital signs documented above Gait: Normal HENT: WNL, normocephalic Pulmonary: normal non-labored breathing without  wheezing Cardiac: regular HR Abdomen: soft, NT, no masses. Can feel abdominal aortic pulse, no tenderness Vascular Exam/Pulses: 2+  femoral, 2+ popliteal, Doppler DP/PT/ pero signals bilaterally Extremities: without ischemic changes, without Gangrene , without cellulitis; without open wounds;  Musculoskeletal: no muscle wasting or atrophy  Neurologic: A&O X 3 Psychiatric:  The pt has Normal affect.   Non-Invasive Vascular Imaging:    Abdominal Aorta Findings:  +-----------+-------+----------+----------+---------+--------+--------+  Location  AP (cm)Trans (cm)PSV (cm/s)Waveform ThrombusComments  +-----------+-------+----------+----------+---------+--------+--------+  Proximal  2.25   2.32      53                                   +-----------+-------+----------+----------+---------+--------+--------+  Mid       1.64   1.60      46                                   +-----------+-------+----------+----------+---------+--------+--------+  Distal    4.80   4.90      53                                    +-----------+-------+----------+----------+---------+--------+--------+  RT CIA Prox0.8    1.0       102                                  +-----------+-------+----------+----------+---------+--------+--------+  LT CIA Prox1.0    0.9       120       triphasic                  +-----------+-------+----------+----------+---------+--------+--------+  Summary:  Abdominal Aorta: There is evidence of abnormal dilatation of the distal Abdominal aorta. The largest aortic diameter remains essentially unchanged compared to prior exam. Previous diameter measurement was 5.0 cm obtained on 01/04/2022.   VAS [place] lower extremity arterial duplex: Summary:  No visualized popliteal artery aneurysm bilaterally.    ASSESSMENT/PLAN:: 77 y.o. female here for follow up for AAA.  Her aneurysm at time of last visit was 4.8 cm based on Duplex and CT evaluation. Duplex today shows no popliteal artery aneurysms. Her AAA is stable with maximum diameter of 4.9 cm on today's duplex. She has no associated back or abdominal pain. No claudication, rest pain or tissue loss.  - Discussed with pt and her niece consideration for repair of AAA would be made when the size is 5.5cm, growth > 1 cm/yr, and symptomatic status - Re discussed with pt and her niece that should she have any severe back or abdominal pain she should call 911 or seek immediate medical care - She will follow up in 6 months with repeat AAA duplex  Graceann Congress, PA-C [facility] [phone]  Clinic MD: Cain/ Edilia Bo

## 2022-08-30 ENCOUNTER — Other Ambulatory Visit: Payer: Self-pay

## 2022-08-30 DIAGNOSIS — I7143 Infrarenal abdominal aortic aneurysm, without rupture: Secondary | ICD-10-CM

## 2022-09-13 DIAGNOSIS — G309 Alzheimer's disease, unspecified: Secondary | ICD-10-CM | POA: Diagnosis not present

## 2022-09-13 DIAGNOSIS — J309 Allergic rhinitis, unspecified: Secondary | ICD-10-CM | POA: Diagnosis not present

## 2022-09-13 DIAGNOSIS — E559 Vitamin D deficiency, unspecified: Secondary | ICD-10-CM | POA: Diagnosis not present

## 2022-09-13 DIAGNOSIS — I119 Hypertensive heart disease without heart failure: Secondary | ICD-10-CM | POA: Diagnosis not present

## 2022-09-26 DIAGNOSIS — G301 Alzheimer's disease with late onset: Secondary | ICD-10-CM | POA: Diagnosis not present

## 2022-10-11 DIAGNOSIS — G309 Alzheimer's disease, unspecified: Secondary | ICD-10-CM | POA: Diagnosis not present

## 2022-10-25 DIAGNOSIS — J449 Chronic obstructive pulmonary disease, unspecified: Secondary | ICD-10-CM | POA: Diagnosis not present

## 2022-10-25 DIAGNOSIS — M6281 Muscle weakness (generalized): Secondary | ICD-10-CM | POA: Diagnosis not present

## 2022-10-25 DIAGNOSIS — K219 Gastro-esophageal reflux disease without esophagitis: Secondary | ICD-10-CM | POA: Diagnosis not present

## 2022-11-01 DIAGNOSIS — G252 Other specified forms of tremor: Secondary | ICD-10-CM | POA: Diagnosis not present

## 2022-11-01 DIAGNOSIS — Z0101 Encounter for examination of eyes and vision with abnormal findings: Secondary | ICD-10-CM | POA: Diagnosis not present

## 2022-11-08 DIAGNOSIS — G8929 Other chronic pain: Secondary | ICD-10-CM | POA: Diagnosis not present

## 2022-11-08 DIAGNOSIS — G309 Alzheimer's disease, unspecified: Secondary | ICD-10-CM | POA: Diagnosis not present

## 2022-11-14 DIAGNOSIS — K219 Gastro-esophageal reflux disease without esophagitis: Secondary | ICD-10-CM | POA: Diagnosis not present

## 2022-11-14 DIAGNOSIS — J449 Chronic obstructive pulmonary disease, unspecified: Secondary | ICD-10-CM | POA: Diagnosis not present

## 2022-11-14 DIAGNOSIS — M6281 Muscle weakness (generalized): Secondary | ICD-10-CM | POA: Diagnosis not present

## 2022-11-21 DIAGNOSIS — G301 Alzheimer's disease with late onset: Secondary | ICD-10-CM | POA: Diagnosis not present

## 2022-11-22 DIAGNOSIS — E559 Vitamin D deficiency, unspecified: Secondary | ICD-10-CM | POA: Diagnosis not present

## 2022-11-22 DIAGNOSIS — J309 Allergic rhinitis, unspecified: Secondary | ICD-10-CM | POA: Diagnosis not present

## 2022-11-22 DIAGNOSIS — G309 Alzheimer's disease, unspecified: Secondary | ICD-10-CM | POA: Diagnosis not present

## 2022-11-22 DIAGNOSIS — I119 Hypertensive heart disease without heart failure: Secondary | ICD-10-CM | POA: Diagnosis not present

## 2022-12-06 DIAGNOSIS — R300 Dysuria: Secondary | ICD-10-CM | POA: Diagnosis not present

## 2022-12-06 DIAGNOSIS — K640 First degree hemorrhoids: Secondary | ICD-10-CM | POA: Diagnosis not present

## 2022-12-11 DIAGNOSIS — R300 Dysuria: Secondary | ICD-10-CM | POA: Diagnosis not present

## 2022-12-11 DIAGNOSIS — N390 Urinary tract infection, site not specified: Secondary | ICD-10-CM | POA: Diagnosis not present

## 2022-12-12 DIAGNOSIS — K640 First degree hemorrhoids: Secondary | ICD-10-CM | POA: Diagnosis not present

## 2022-12-12 DIAGNOSIS — K219 Gastro-esophageal reflux disease without esophagitis: Secondary | ICD-10-CM | POA: Diagnosis not present

## 2022-12-12 DIAGNOSIS — N390 Urinary tract infection, site not specified: Secondary | ICD-10-CM | POA: Diagnosis not present

## 2022-12-12 DIAGNOSIS — E1169 Type 2 diabetes mellitus with other specified complication: Secondary | ICD-10-CM | POA: Diagnosis not present

## 2022-12-13 DIAGNOSIS — E039 Hypothyroidism, unspecified: Secondary | ICD-10-CM | POA: Diagnosis not present

## 2022-12-13 DIAGNOSIS — E119 Type 2 diabetes mellitus without complications: Secondary | ICD-10-CM | POA: Diagnosis not present

## 2022-12-13 DIAGNOSIS — E559 Vitamin D deficiency, unspecified: Secondary | ICD-10-CM | POA: Diagnosis not present

## 2022-12-13 DIAGNOSIS — I10 Essential (primary) hypertension: Secondary | ICD-10-CM | POA: Diagnosis not present

## 2022-12-13 DIAGNOSIS — E782 Mixed hyperlipidemia: Secondary | ICD-10-CM | POA: Diagnosis not present

## 2022-12-19 DIAGNOSIS — G301 Alzheimer's disease with late onset: Secondary | ICD-10-CM | POA: Diagnosis not present

## 2022-12-20 DIAGNOSIS — G309 Alzheimer's disease, unspecified: Secondary | ICD-10-CM | POA: Diagnosis not present

## 2023-01-01 DIAGNOSIS — E113293 Type 2 diabetes mellitus with mild nonproliferative diabetic retinopathy without macular edema, bilateral: Secondary | ICD-10-CM | POA: Diagnosis not present

## 2023-01-01 DIAGNOSIS — E1136 Type 2 diabetes mellitus with diabetic cataract: Secondary | ICD-10-CM | POA: Diagnosis not present

## 2023-01-01 DIAGNOSIS — H25013 Cortical age-related cataract, bilateral: Secondary | ICD-10-CM | POA: Diagnosis not present

## 2023-01-10 DIAGNOSIS — J441 Chronic obstructive pulmonary disease with (acute) exacerbation: Secondary | ICD-10-CM | POA: Diagnosis not present

## 2023-01-13 IMAGING — CT CT HEAD W/O CM
3 series · 15 of 46 positions shown, 18 images · non-contrast
Comparison: None.

CLINICAL DATA: Maxillofacial pain.  Confusion and headache.

EXAM:
CT HEAD WITHOUT CONTRAST
TECHNIQUE: Contiguous axial images were obtained from the base of the skull
through the vertex without intravenous contrast.

[Series 2: head wo · axial · 0.47mm/px · z∈[-87,+33]mm · 9 of 29 slices shown, 12 images]
[im 3/29  brain]
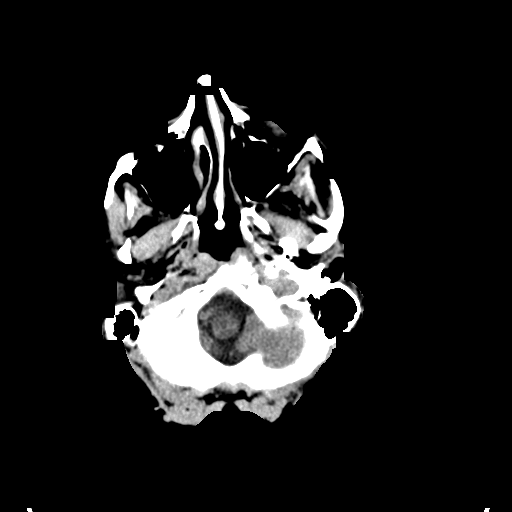
[im 3/29  bone]
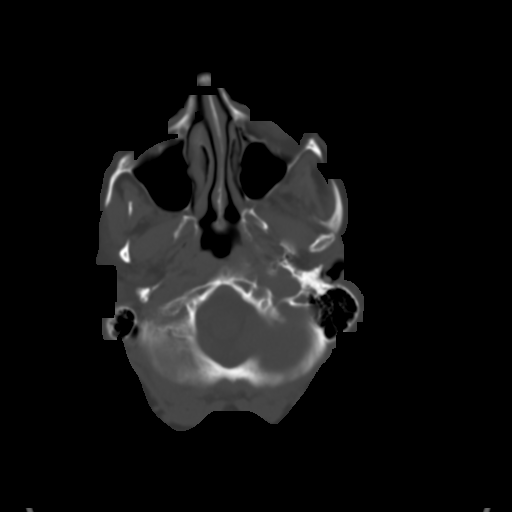
[im 6/29  brain]
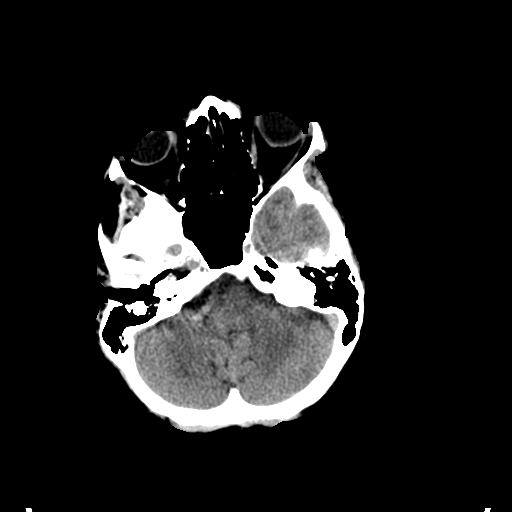
[im 9/29  brain]
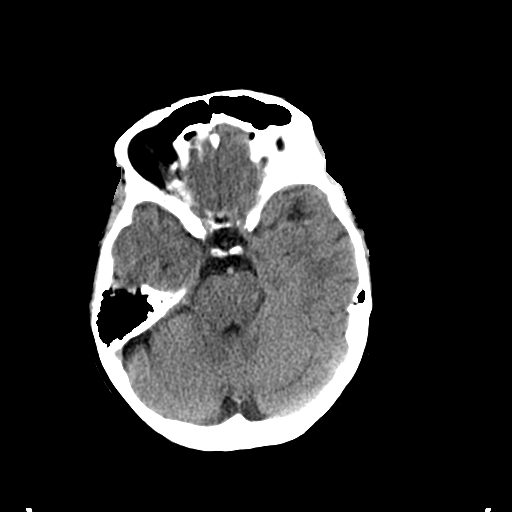
[im 12/29  brain]
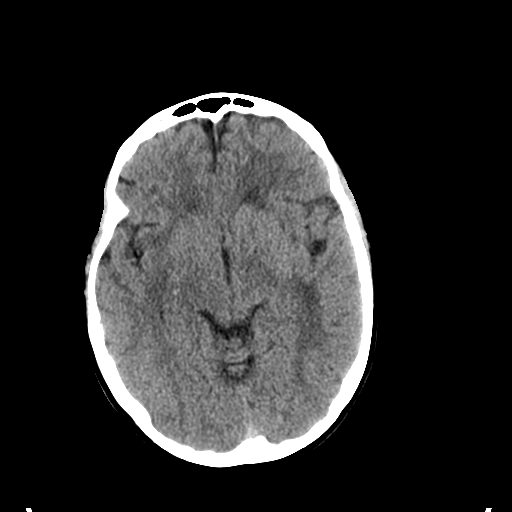
[im 15/29  brain]
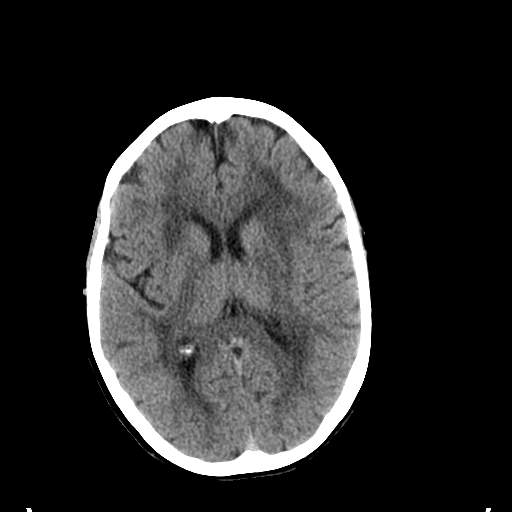
[im 15/29  bone]
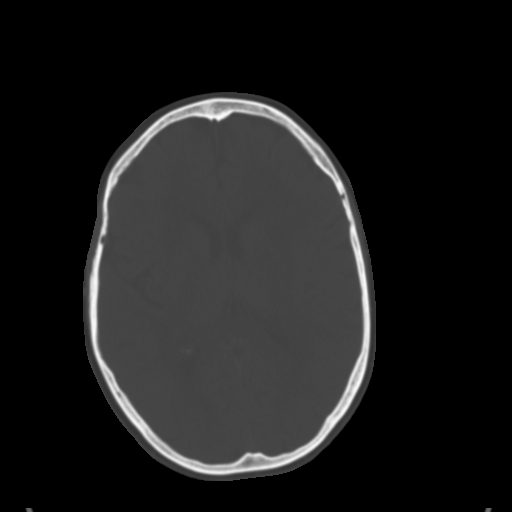
[im 18/29  brain]
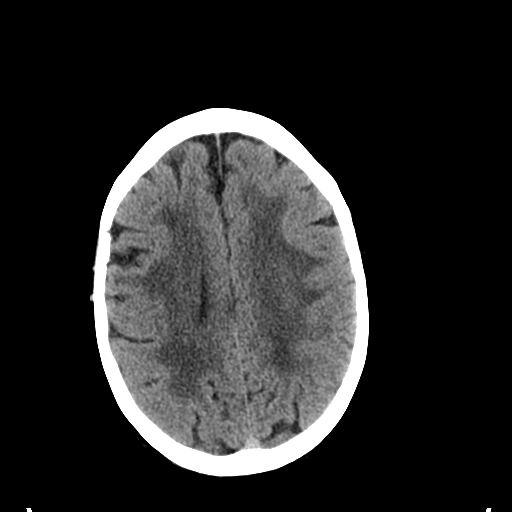
[im 21/29  brain]
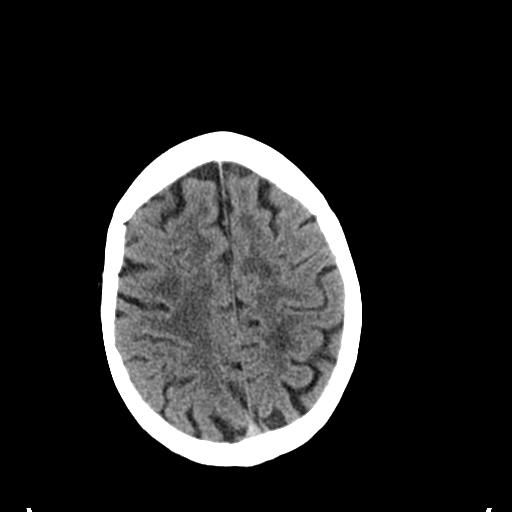
[im 24/29  brain]
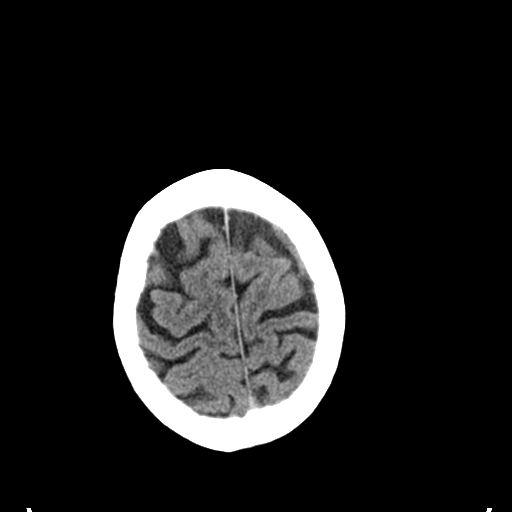
[im 27/29  brain]
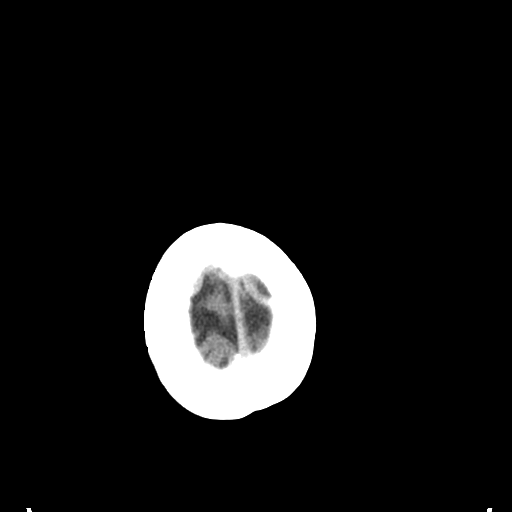
[im 27/29  bone]
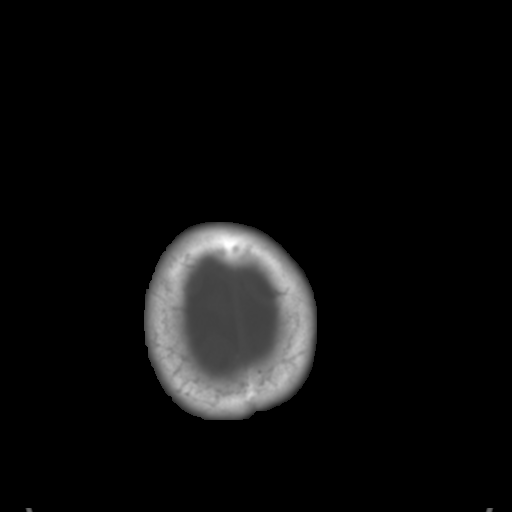

[Series 4: coronal soft tissue · coronal · 0.27mm/px · 3 of 61 slices shown]
[im 21/61  brain]
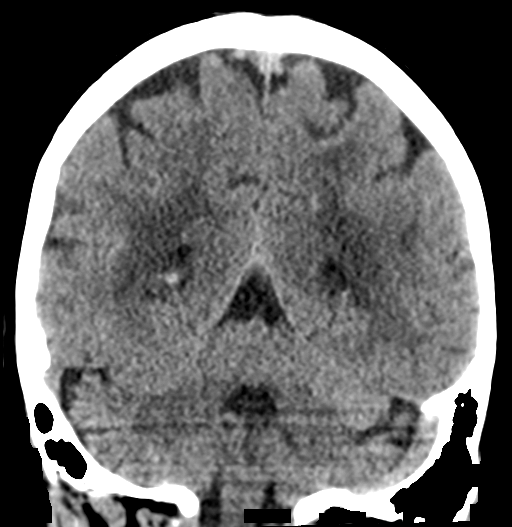
[im 27/61  brain]
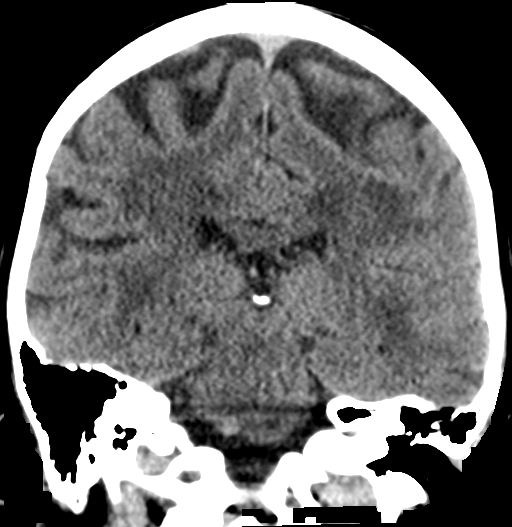
[im 34/61  brain]
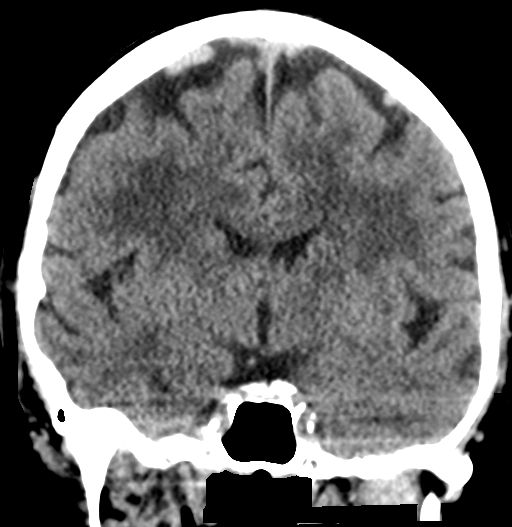

[Series 5: sagittal soft tissue · sagittal · 0.28mm/px · 3 of 46 slices shown]
[im 16/46  brain]
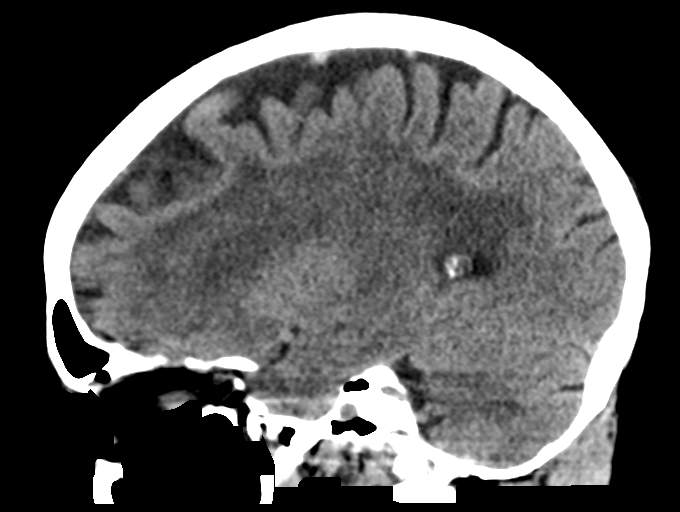
[im 23/46  brain]
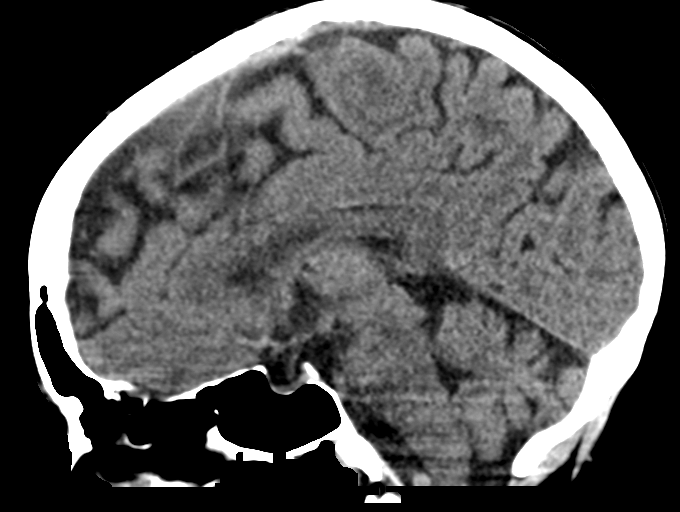
[im 31/46  brain]
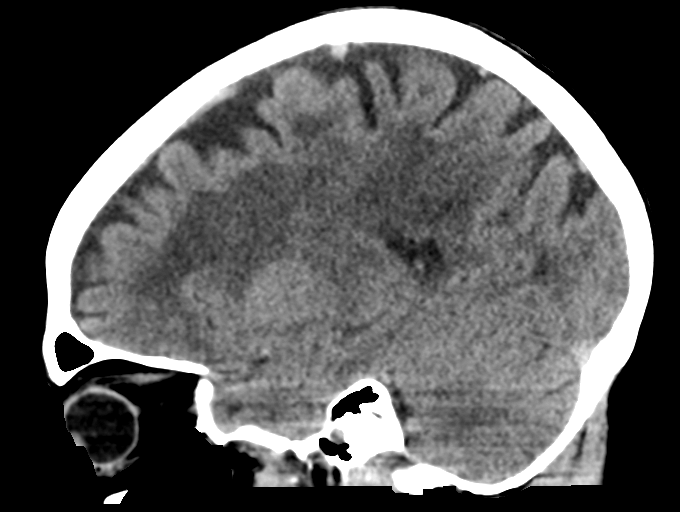

[15 of 46 positions shown; findings below may reference images not displayed]

FINDINGS: Brain:

Cerebral ventricle sizes are concordant with the degree of cerebral
volume loss. Patchy and confluent areas of decreased attenuation are
noted throughout the deep and periventricular white matter of the
cerebral hemispheres bilaterally, compatible with chronic
microvascular ischemic disease.

No evidence of large-territorial acute infarction. No parenchymal
hemorrhage. No mass lesion. No extra-axial collection.

No mass effect or midline shift. No hydrocephalus. Basilar cisterns
are patent.

Vascular: No hyperdense vessel. Atherosclerotic calcifications are
present within the cavernous internal carotid arteries.

Skull: No acute fracture or focal lesion.

Sinuses/Orbits: Paranasal sinuses and mastoid air cells are clear.
The orbits are unremarkable.

Other: None.
IMPRESSION: No acute intracranial abnormality in a patient with diffuse atrophy
and chronic microvascular ischemic changes.

## 2023-01-17 ENCOUNTER — Ambulatory Visit: Payer: 59 | Admitting: Vascular Surgery

## 2023-01-17 ENCOUNTER — Encounter (HOSPITAL_COMMUNITY): Payer: 59

## 2023-01-17 DIAGNOSIS — M6281 Muscle weakness (generalized): Secondary | ICD-10-CM | POA: Diagnosis not present

## 2023-01-17 DIAGNOSIS — J441 Chronic obstructive pulmonary disease with (acute) exacerbation: Secondary | ICD-10-CM | POA: Diagnosis not present

## 2023-01-17 DIAGNOSIS — Z79899 Other long term (current) drug therapy: Secondary | ICD-10-CM | POA: Diagnosis not present

## 2023-01-17 DIAGNOSIS — I119 Hypertensive heart disease without heart failure: Secondary | ICD-10-CM | POA: Diagnosis not present

## 2023-01-17 DIAGNOSIS — G301 Alzheimer's disease with late onset: Secondary | ICD-10-CM | POA: Diagnosis not present

## 2023-02-14 DIAGNOSIS — G309 Alzheimer's disease, unspecified: Secondary | ICD-10-CM | POA: Diagnosis not present

## 2023-02-14 DIAGNOSIS — G8929 Other chronic pain: Secondary | ICD-10-CM | POA: Diagnosis not present

## 2023-02-14 DIAGNOSIS — E782 Mixed hyperlipidemia: Secondary | ICD-10-CM | POA: Diagnosis not present

## 2023-02-21 ENCOUNTER — Ambulatory Visit: Payer: 59

## 2023-02-21 ENCOUNTER — Ambulatory Visit (HOSPITAL_COMMUNITY): Payer: Medicaid Other

## 2023-02-21 DIAGNOSIS — E559 Vitamin D deficiency, unspecified: Secondary | ICD-10-CM | POA: Diagnosis not present

## 2023-02-21 DIAGNOSIS — K219 Gastro-esophageal reflux disease without esophagitis: Secondary | ICD-10-CM | POA: Diagnosis not present

## 2023-02-21 DIAGNOSIS — E1169 Type 2 diabetes mellitus with other specified complication: Secondary | ICD-10-CM | POA: Diagnosis not present

## 2023-03-14 DIAGNOSIS — J441 Chronic obstructive pulmonary disease with (acute) exacerbation: Secondary | ICD-10-CM | POA: Diagnosis not present

## 2023-03-14 DIAGNOSIS — G301 Alzheimer's disease with late onset: Secondary | ICD-10-CM | POA: Diagnosis not present

## 2023-03-15 DIAGNOSIS — G8929 Other chronic pain: Secondary | ICD-10-CM | POA: Diagnosis not present

## 2023-03-15 DIAGNOSIS — E782 Mixed hyperlipidemia: Secondary | ICD-10-CM | POA: Diagnosis not present

## 2023-03-21 DIAGNOSIS — J441 Chronic obstructive pulmonary disease with (acute) exacerbation: Secondary | ICD-10-CM | POA: Diagnosis not present

## 2023-03-21 DIAGNOSIS — G301 Alzheimer's disease with late onset: Secondary | ICD-10-CM | POA: Diagnosis not present

## 2023-03-21 DIAGNOSIS — J309 Allergic rhinitis, unspecified: Secondary | ICD-10-CM | POA: Diagnosis not present

## 2023-03-28 DIAGNOSIS — R0602 Shortness of breath: Secondary | ICD-10-CM | POA: Diagnosis not present

## 2023-03-28 DIAGNOSIS — R059 Cough, unspecified: Secondary | ICD-10-CM | POA: Diagnosis not present

## 2023-03-29 DIAGNOSIS — R0989 Other specified symptoms and signs involving the circulatory and respiratory systems: Secondary | ICD-10-CM | POA: Diagnosis not present

## 2023-03-29 DIAGNOSIS — R059 Cough, unspecified: Secondary | ICD-10-CM | POA: Diagnosis not present

## 2023-04-04 DIAGNOSIS — J309 Allergic rhinitis, unspecified: Secondary | ICD-10-CM | POA: Diagnosis not present

## 2023-04-05 ENCOUNTER — Ambulatory Visit: Payer: 59

## 2023-04-05 ENCOUNTER — Ambulatory Visit (HOSPITAL_COMMUNITY): Payer: 59

## 2023-04-11 DIAGNOSIS — G301 Alzheimer's disease with late onset: Secondary | ICD-10-CM | POA: Diagnosis not present

## 2023-04-11 DIAGNOSIS — G8929 Other chronic pain: Secondary | ICD-10-CM | POA: Diagnosis not present

## 2023-04-18 DIAGNOSIS — I10 Essential (primary) hypertension: Secondary | ICD-10-CM | POA: Diagnosis not present

## 2023-04-18 DIAGNOSIS — E119 Type 2 diabetes mellitus without complications: Secondary | ICD-10-CM | POA: Diagnosis not present

## 2023-04-18 DIAGNOSIS — E559 Vitamin D deficiency, unspecified: Secondary | ICD-10-CM | POA: Diagnosis not present

## 2023-04-19 DIAGNOSIS — I10 Essential (primary) hypertension: Secondary | ICD-10-CM | POA: Diagnosis not present

## 2023-04-19 DIAGNOSIS — J449 Chronic obstructive pulmonary disease, unspecified: Secondary | ICD-10-CM | POA: Diagnosis not present

## 2023-05-02 DIAGNOSIS — K219 Gastro-esophageal reflux disease without esophagitis: Secondary | ICD-10-CM | POA: Diagnosis not present

## 2023-05-02 DIAGNOSIS — I712 Thoracic aortic aneurysm, without rupture, unspecified: Secondary | ICD-10-CM | POA: Diagnosis not present

## 2023-05-02 DIAGNOSIS — J449 Chronic obstructive pulmonary disease, unspecified: Secondary | ICD-10-CM | POA: Diagnosis not present

## 2023-05-02 DIAGNOSIS — J309 Allergic rhinitis, unspecified: Secondary | ICD-10-CM | POA: Diagnosis not present

## 2023-05-09 DIAGNOSIS — G301 Alzheimer's disease with late onset: Secondary | ICD-10-CM | POA: Diagnosis not present

## 2023-05-09 DIAGNOSIS — G8929 Other chronic pain: Secondary | ICD-10-CM | POA: Diagnosis not present

## 2023-05-29 DIAGNOSIS — I1310 Hypertensive heart and chronic kidney disease without heart failure, with stage 1 through stage 4 chronic kidney disease, or unspecified chronic kidney disease: Secondary | ICD-10-CM | POA: Diagnosis not present

## 2023-05-29 DIAGNOSIS — N183 Chronic kidney disease, stage 3 unspecified: Secondary | ICD-10-CM | POA: Diagnosis not present

## 2023-06-01 DIAGNOSIS — E559 Vitamin D deficiency, unspecified: Secondary | ICD-10-CM | POA: Diagnosis not present

## 2023-06-01 DIAGNOSIS — G309 Alzheimer's disease, unspecified: Secondary | ICD-10-CM | POA: Diagnosis not present

## 2023-06-01 DIAGNOSIS — N183 Chronic kidney disease, stage 3 unspecified: Secondary | ICD-10-CM | POA: Diagnosis not present

## 2023-06-01 DIAGNOSIS — I1310 Hypertensive heart and chronic kidney disease without heart failure, with stage 1 through stage 4 chronic kidney disease, or unspecified chronic kidney disease: Secondary | ICD-10-CM | POA: Diagnosis not present

## 2023-06-06 DIAGNOSIS — G301 Alzheimer's disease with late onset: Secondary | ICD-10-CM | POA: Diagnosis not present

## 2023-06-20 DIAGNOSIS — M6281 Muscle weakness (generalized): Secondary | ICD-10-CM | POA: Diagnosis not present

## 2023-06-20 DIAGNOSIS — W19XXXA Unspecified fall, initial encounter: Secondary | ICD-10-CM | POA: Diagnosis not present

## 2023-06-26 DIAGNOSIS — N183 Chronic kidney disease, stage 3 unspecified: Secondary | ICD-10-CM | POA: Diagnosis not present

## 2023-06-26 DIAGNOSIS — I1310 Hypertensive heart and chronic kidney disease without heart failure, with stage 1 through stage 4 chronic kidney disease, or unspecified chronic kidney disease: Secondary | ICD-10-CM | POA: Diagnosis not present

## 2023-06-26 DIAGNOSIS — E559 Vitamin D deficiency, unspecified: Secondary | ICD-10-CM | POA: Diagnosis not present

## 2023-06-27 DIAGNOSIS — I10 Essential (primary) hypertension: Secondary | ICD-10-CM | POA: Diagnosis not present

## 2023-06-27 DIAGNOSIS — J449 Chronic obstructive pulmonary disease, unspecified: Secondary | ICD-10-CM | POA: Diagnosis not present

## 2023-06-27 DIAGNOSIS — J309 Allergic rhinitis, unspecified: Secondary | ICD-10-CM | POA: Diagnosis not present

## 2023-07-04 DIAGNOSIS — G8929 Other chronic pain: Secondary | ICD-10-CM | POA: Diagnosis not present

## 2023-07-04 DIAGNOSIS — G301 Alzheimer's disease with late onset: Secondary | ICD-10-CM | POA: Diagnosis not present

## 2023-07-11 ENCOUNTER — Ambulatory Visit (HOSPITAL_COMMUNITY)
Admission: RE | Admit: 2023-07-11 | Discharge: 2023-07-11 | Disposition: A | Discharge status: Home / Self Care | Source: Ambulatory Visit | Attending: Vascular Surgery | Admitting: Vascular Surgery

## 2023-07-11 ENCOUNTER — Other Ambulatory Visit: Payer: Self-pay

## 2023-07-11 ENCOUNTER — Ambulatory Visit (INDEPENDENT_AMBULATORY_CARE_PROVIDER_SITE_OTHER)

## 2023-07-11 VITALS — BP 108/72 | HR 70 | Temp 97.9°F | Ht <= 58 in | Wt 128.1 lb

## 2023-07-11 DIAGNOSIS — I7143 Infrarenal abdominal aortic aneurysm, without rupture: Secondary | ICD-10-CM

## 2023-07-11 NOTE — Progress Notes (Signed)
 Office Note     CC:  follow up Requesting Provider:  Lorina Roosevelt*  HPI: Kylie Hurst is a 78 y.o. (07-14-1945) female who presents for surveillance of abdominal aortic aneurysm.  She was last seen in the office 6 months ago with AAA measuring 4.9 cm based on duplex.  She denies any new or changing abdominal or back pain.  She is accompanied today by her daughter.  She is taking an aspirin  and statin daily.  Her daughter states that the patient has a diagnosis of CKD however renal labs were normal in January of last year.   Past Medical History:  Diagnosis Date   Aortic aneurysm (HCC) 2011   3.34mm   Arthritis    Back spasm    Bulging discs    Chronic back pain    Dementia (HCC)    Depression    Diabetes mellitus without complication (HCC)    fasting 90-110   Emphysema    Environmental and seasonal allergies    Hyperlipidemia    Hypertension    Neuromuscular disorder (HCC)    Onycholysis of toenail    Osteoporosis    Ovarian cyst 06/01/2009   5 cm   Restless legs    Spigelian hernia    left ventral   Wears glasses     Past Surgical History:  Procedure Laterality Date   ANTERIOR CERVICAL DECOMP/DISCECTOMY FUSION N/A 12/18/2012   Procedure: ANTERIOR CERVICAL DECOMPRESSION/DISCECTOMY FUSION 1 LEVEL;  Surgeon: Estevan Helper, MD;  Location: [facility];  Service: Orthopedics;  Laterality: N/A;  Anterior cervical decompression fusion, cervical 7 - thoracic 1 with instrumentation, allograft   COLONOSCOPY     INSERTION OF MESH Left 05/14/2014   Procedure: INSERTION OF MESH;  Surgeon: Dareen Ebbing, MD;  Location: [facility] OR;  Service: General;  Laterality: Left;   NECK SURGERY     SPIGELIAN HERNIA  05/14/2014   VENTRAL HERNIA REPAIR Left 05/14/2014   Procedure: ATTEMPTED LAPAROSCOPIC REPAIR LEFT SPIGLIAN HERINA CONVERTED TO OPEN REPAIR;  Surgeon: Dareen Ebbing, MD;  Location: [facility] OR;  Service: General;  Laterality: Left;    Social History   Socioeconomic History    Marital status: Divorced    Spouse name: Not on file   Number of children: Not on file   Years of education: Not on file   Highest education level: Not on file  Occupational History   Not on file  Tobacco Use   Smoking status: Former    Current packs/day: 1.00    Average packs/day: 1 pack/day for 52.0 years (52.0 ttl pk-yrs)    Types: Cigarettes   Smokeless tobacco: Never   Tobacco comments:    Currently on Chantix   Vaping Use   Vaping status: Never Used  Substance and Sexual Activity   Alcohol use: No   Drug use: No   Sexual activity: Not on file  Other Topics Concern   Not on file  Social History Narrative   Not on file   Social Drivers of Health   Financial Resource Strain: Not on file  Food Insecurity: No Food Insecurity (12/27/2020)   Received from [facility], [facility]   Hunger Vital Sign    Worried About Running Out of Food in the Last Year: Never true    Ran Out of Food in the Last Year: Never true  Transportation Needs: Not on file  Physical Activity: Not on file  Stress: Not on file  Social Connections: Unknown (06/12/2021)   [facility]   Social Network    Social Network: Not on file  Intimate Partner Violence: Unknown (05/04/2021)   Received from [facility]   HITS    Physically Hurt: Not on file    Insult or Talk Down To: Not on file    Threaten Physical Harm: Not on file    Scream or Curse: Not on file    Family History  Problem Relation Age of Onset   Diabetes Mother    Cancer Sister    Epilepsy Brother     Current Outpatient Medications  Medication Sig Dispense Refill   ACCU-CHEK AVIVA PLUS test strip USE AS DIRECTED TO TEST BLOOD SUGAR DAILY     albuterol  (VENTOLIN  HFA) 108 (90 Base) MCG/ACT inhaler Inhale 1 puff into the lungs every 4 (four) hours as needed for shortness of breath or wheezing.     ANORO ELLIPTA  62.5-25 MCG/INH AEPB Inhale 1 puff into the lungs in the morning.      aspirin  (ASPIRIN  CHILDRENS) 81 MG chewable tablet Chew 1 tablet (81 mg total) by mouth daily. 30 tablet 0   b complex vitamins tablet Take 1 tablet by mouth daily.     busPIRone (BUSPAR) 7.5 MG tablet Take 7.5 mg by mouth 2 (two) times daily.     cholecalciferol (VITAMIN D3) 25 MCG (1000 UNIT) tablet Take 2,000 Units by mouth daily.     denosumab (PROLIA) 60 MG/ML SOSY injection Inject 60 mg into the skin every 6 (six) months.     escitalopram (LEXAPRO) 10 MG tablet Take 10 mg by mouth daily.     gabapentin  (NEURONTIN ) 300 MG capsule Take 300 mg by mouth at bedtime.     hydrochlorothiazide  (HYDRODIURIL ) 12.5 MG tablet Take 12.5 mg by mouth daily.     KLOR-CON  M20 20 MEQ tablet Take 20 mEq by mouth every evening.     lisinopril  (PRINIVIL ,ZESTRIL ) 5 MG tablet Take 5 mg by mouth at bedtime.      polyethylene glycol-electrolytes (NULYTELY) 420 g solution See admin instructions.     pravastatin  (PRAVACHOL ) 20 MG tablet Take 20 mg by mouth at bedtime.      SHINGRIX injection      sodium chloride  (OCEAN) 0.65 % SOLN nasal spray Place 2 sprays into both nostrils at bedtime.     tetrahydrozoline 0.05 % ophthalmic solution Place 1 drop into both eyes 3 (three) times daily.     No current facility-administered medications for this visit.    Allergies  Allergen Reactions   Other Itching and Other (See Comments)    Seasonal Allergies- Itchy eyes, runny nose, etc..     REVIEW OF SYSTEMS:   [X]  denotes positive finding, [ ]  denotes negative finding Cardiac  Comments:  Chest pain or chest pressure:    Shortness of breath upon exertion:    Short of breath when lying flat:    Irregular heart rhythm:        Vascular    Pain in calf, thigh, or hip brought on by ambulation:    Pain in feet at night that wakes you up from your sleep:     Blood clot in your veins:    Leg swelling:         Pulmonary    Oxygen at home:    Productive cough:     Wheezing:         Neurologic    Sudden weakness in  arms or legs:  Sudden numbness in arms or legs:     Sudden onset of difficulty speaking or slurred speech:    Temporary loss of vision in one eye:     Problems with dizziness:         Gastrointestinal    Blood in stool:     Vomited blood:         Genitourinary    Burning when urinating:     Blood in urine:        Psychiatric    Major depression:         Hematologic    Bleeding problems:    Problems with blood clotting too easily:        Skin    Rashes or ulcers:        Constitutional    Fever or chills:      PHYSICAL EXAMINATION:  Vitals:   07/11/23 0836  BP: 108/72  Pulse: 70  Temp: 97.9 F (36.6 C)  TempSrc: Temporal  SpO2: 90%  Weight: 128 lb 1.6 oz (58.1 kg)  Height: 4' 9 (1.448 m)    General:  WDWN in NAD; vital signs documented above Gait: Not observed HENT: WNL, normocephalic Pulmonary: normal non-labored breathing Cardiac: regular HR Abdomen: soft, NT, no masses Skin: without rashes Vascular Exam/Pulses: palpable L PT; faintly palpable R AT Extremities: without ischemic changes, without Gangrene , without cellulitis; without open wounds;  Musculoskeletal: no muscle wasting or atrophy  Neurologic: A&O X 3 Psychiatric:  The pt has Normal affect.   Non-Invasive Vascular Imaging:   AAA duplex demonstrating 5.1 cm AAA at largest diameter    ASSESSMENT/PLAN:: 78 y.o. female here for follow up for surveillance of AAA  Subjectively, the patient has not experienced any new or changing abdominal or back pain.  6 months ago AAA measured 4.9 cm.  This has increased to 5.1 cm in largest diameter by duplex today.  We will schedule a CTA abdomen and pelvis.  She will be seen in the office by Dr. Vikki Graves to review results of CTA and discuss possible repair of AAA.  She will continue her aspirin  and statin daily.  She will likely need lab work prior to CTA given history of CKD diagnosis.   Cordie Deters, PA-C [facility] [phone]  Clinic MD:   Vikki Graves

## 2023-07-19 ENCOUNTER — Ambulatory Visit
Admission: RE | Admit: 2023-07-19 | Discharge: 2023-07-19 | Disposition: A | Discharge status: Home / Self Care | Source: Ambulatory Visit | Attending: Vascular Surgery

## 2023-07-19 DIAGNOSIS — I7143 Infrarenal abdominal aortic aneurysm, without rupture: Secondary | ICD-10-CM

## 2023-07-19 MED ORDER — IOPAMIDOL (ISOVUE-370) INJECTION 76%
80.0000 mL | Freq: Once | INTRAVENOUS | Status: AC | PRN
  Administered 2023-07-19: 80 mL via INTRAVENOUS

## 2023-07-25 ENCOUNTER — Encounter: Payer: Self-pay | Admitting: Vascular Surgery

## 2023-07-25 ENCOUNTER — Telehealth: Payer: Self-pay

## 2023-07-25 ENCOUNTER — Ambulatory Visit: Attending: Vascular Surgery | Admitting: Vascular Surgery

## 2023-07-25 VITALS — BP 94/60 | HR 70 | Temp 97.9°F

## 2023-07-25 DIAGNOSIS — J449 Chronic obstructive pulmonary disease, unspecified: Secondary | ICD-10-CM | POA: Diagnosis not present

## 2023-07-25 DIAGNOSIS — I712 Thoracic aortic aneurysm, without rupture, unspecified: Secondary | ICD-10-CM | POA: Diagnosis not present

## 2023-07-25 DIAGNOSIS — I7143 Infrarenal abdominal aortic aneurysm, without rupture: Secondary | ICD-10-CM | POA: Diagnosis not present

## 2023-07-25 DIAGNOSIS — N183 Chronic kidney disease, stage 3 unspecified: Secondary | ICD-10-CM | POA: Diagnosis not present

## 2023-07-25 DIAGNOSIS — E1169 Type 2 diabetes mellitus with other specified complication: Secondary | ICD-10-CM | POA: Diagnosis not present

## 2023-07-25 DIAGNOSIS — R252 Cramp and spasm: Secondary | ICD-10-CM | POA: Diagnosis not present

## 2023-07-25 NOTE — Telephone Encounter (Signed)
 Per Dr. Sheree note, patient needs cardiac clearance prior to scheduling EVAR.  Referral sent by L.Stockard 6/25.

## 2023-07-25 NOTE — Progress Notes (Signed)
 Patient ID: Kylie Hurst, female   DOB: Mar 23, 1945, 78 y.o.   MRN: [id number]  Reason for Consult: Follow-up   Referred by No ref. provider found  Subjective:     HPI:  Kylie Hurst is a 78 y.o. female with known history of abdominal aortic aneurysm now following up with CT scan.  Her father possibly had an aneurysm but currently unknown.  She denies any new back or abdominal pain.  She does walk although has limitations due to leg pain.  She is in a wheelchair today just for transportation to the building.  She does not have any lower extremity pain at rest.  She does have hyperlipidemia, hypertension and is a former smoker.  She does not take any blood thinners she is on aspirin .  Past Medical History:  Diagnosis Date   Aortic aneurysm (HCC) 2011   3.5mm   Arthritis    Back spasm    Bulging discs    Chronic back pain    Dementia (HCC)    Depression    Diabetes mellitus without complication (HCC)    fasting 90-110   Emphysema    Environmental and seasonal allergies    Hyperlipidemia    Hypertension    Neuromuscular disorder (HCC)    Onycholysis of toenail    Osteoporosis    Ovarian cyst 06/01/2009   5 cm   Restless legs    Spigelian hernia    left ventral   Wears glasses    Family History  Problem Relation Age of Onset   Diabetes Mother    Cancer Sister    Epilepsy Brother    Past Surgical History:  Procedure Laterality Date   ANTERIOR CERVICAL DECOMP/DISCECTOMY FUSION N/A 12/18/2012   Procedure: ANTERIOR CERVICAL DECOMPRESSION/DISCECTOMY FUSION 1 LEVEL;  Surgeon: Oneil Rodgers Priestly, MD;  Location: [facility] OR;  Service: Orthopedics;  Laterality: N/A;  Anterior cervical decompression fusion, cervical 7 - thoracic 1 with instrumentation, allograft   COLONOSCOPY     INSERTION OF MESH Left 05/14/2014   Procedure: INSERTION OF MESH;  Surgeon: Donnice Lima, MD;  Location: [facility] OR;  Service: General;  Laterality: Left;   NECK SURGERY     SPIGELIAN HERNIA  05/14/2014    VENTRAL HERNIA REPAIR Left 05/14/2014   Procedure: ATTEMPTED LAPAROSCOPIC REPAIR LEFT SPIGLIAN HERINA CONVERTED TO OPEN REPAIR;  Surgeon: Donnice Lima, MD;  Location: [facility] OR;  Service: General;  Laterality: Left;    Short Social History:  Social History   Tobacco Use   Smoking status: Former    Current packs/day: 1.00    Average packs/day: 1 pack/day for 52.0 years (52.0 ttl pk-yrs)    Types: Cigarettes   Smokeless tobacco: Never   Tobacco comments:    Currently on Chantix   Substance Use Topics   Alcohol use: No    Allergies  Allergen Reactions   Other Itching and Other (See Comments)    Seasonal Allergies- Itchy eyes, runny nose, etc..    Current Outpatient Medications  Medication Sig Dispense Refill   ACCU-CHEK AVIVA PLUS test strip USE AS DIRECTED TO TEST BLOOD SUGAR DAILY     albuterol  (VENTOLIN  HFA) 108 (90 Base) MCG/ACT inhaler Inhale 1 puff into the lungs every 4 (four) hours as needed for shortness of breath or wheezing.     ANORO ELLIPTA  62.5-25 MCG/INH AEPB Inhale 1 puff into the lungs in the morning.     aspirin  (ASPIRIN  CHILDRENS) 81 MG chewable tablet Chew 1 tablet (81 mg total) by  mouth daily. 30 tablet 0   b complex vitamins tablet Take 1 tablet by mouth daily.     busPIRone (BUSPAR) 7.5 MG tablet Take 7.5 mg by mouth 2 (two) times daily.     cholecalciferol (VITAMIN D3) 25 MCG (1000 UNIT) tablet Take 2,000 Units by mouth daily.     denosumab (PROLIA) 60 MG/ML SOSY injection Inject 60 mg into the skin every 6 (six) months.     escitalopram (LEXAPRO) 10 MG tablet Take 10 mg by mouth daily.     gabapentin  (NEURONTIN ) 300 MG capsule Take 300 mg by mouth at bedtime.     hydrochlorothiazide  (HYDRODIURIL ) 12.5 MG tablet Take 12.5 mg by mouth daily.     KLOR-CON  M20 20 MEQ tablet Take 20 mEq by mouth every evening.     lisinopril  (PRINIVIL ,ZESTRIL ) 5 MG tablet Take 5 mg by mouth at bedtime.      polyethylene glycol-electrolytes (NULYTELY) 420 g solution See admin  instructions.     pravastatin  (PRAVACHOL ) 20 MG tablet Take 20 mg by mouth at bedtime.      SHINGRIX injection      sodium chloride  (OCEAN) 0.65 % SOLN nasal spray Place 2 sprays into both nostrils at bedtime.     tetrahydrozoline 0.05 % ophthalmic solution Place 1 drop into both eyes 3 (three) times daily.     No current facility-administered medications for this visit.    Review of Systems  Constitutional:  Constitutional negative. HENT: HENT negative.  Eyes: Eyes negative.  Respiratory: Respiratory negative.  Cardiovascular: Cardiovascular negative.  GI: Gastrointestinal negative.  Musculoskeletal: Musculoskeletal negative.  Skin: Skin negative.  Neurological: Neurological negative. Hematologic: Hematologic/lymphatic negative.  Psychiatric: Psychiatric negative.        Objective:  Objective   Vitals:   07/25/23 1449  BP: 94/60  Pulse: 70  Temp: 97.9 F (36.6 C)  SpO2: 90%     Physical Exam HENT:     Head: Normocephalic.   Eyes:     Pupils: Pupils are equal, round, and reactive to light.    Cardiovascular:     Rate and Rhythm: Normal rate.     Pulses:          Femoral pulses are 2+ on the right side and 1+ on the left side.      Popliteal pulses are 2+ on the right side and 0 on the left side.  Abdominal:     General: Abdomen is flat.     Palpations: Abdomen is soft.   Musculoskeletal:        General: Normal range of motion.     Right lower leg: No edema.     Left lower leg: No edema.   Skin:    General: Skin is warm.     Capillary Refill: Capillary refill takes less than 2 seconds.   Neurological:     General: No focal deficit present.     Mental Status: She is alert.   Psychiatric:        Mood and Affect: Mood normal.        Behavior: Behavior normal.        Thought Content: Thought content normal.        Judgment: Judgment normal.     Data: CTA IMPRESSION: VASCULAR   1. Abdominal aortic aneurysm measuring 5 cm, not appreciably  changed by my measurement since previous study. Recommend CTA or MRA, as appropriate, in 6 months and referral to a vascular specialist. Reference: Journal of Vascular Surgery 67.1 (  2018): 2-77. J Am Coll Radiol 2013;10:789-794. 2.  Aortic Atherosclerosis (ICD10-I70.0).   NON-VASCULAR   1. Cholelithiasis without acute cholecystitis. 2. Distal colonic diverticulosis without diverticulitis. 3. Right lower quadrant spigelian hernia containing a small portion of the cecum. No incarceration or obstruction. 4. Small hiatal hernia.     Assessment/Plan:     78 year old female with history of abdominal aortic aneurysm now 5.1 cm.  We reviewed the CT scan together today and discussed her options being continued watchful waiting versus endovascular repair.  She does have calcium  in the left common femoral artery and may require cutdown for repair versus endarterectomy.  We discussed the risk benefits alternatives as well as possible need for future procedures and she demonstrates good understanding.  Will get her scheduled after cardiac clearance.     Penne Lonni Colorado MD [facility]

## 2023-07-31 DIAGNOSIS — M1731 Unilateral post-traumatic osteoarthritis, right knee: Secondary | ICD-10-CM | POA: Diagnosis not present

## 2023-07-31 DIAGNOSIS — M19032 Primary osteoarthritis, left wrist: Secondary | ICD-10-CM | POA: Diagnosis not present

## 2023-07-31 DIAGNOSIS — M1732 Unilateral post-traumatic osteoarthritis, left knee: Secondary | ICD-10-CM | POA: Diagnosis not present

## 2023-07-31 DIAGNOSIS — M4206 Juvenile osteochondrosis of spine, lumbar region: Secondary | ICD-10-CM | POA: Diagnosis not present

## 2023-07-31 DIAGNOSIS — L89104 Pressure ulcer of unspecified part of back, stage 4: Secondary | ICD-10-CM | POA: Diagnosis not present

## 2023-07-31 DIAGNOSIS — M19031 Primary osteoarthritis, right wrist: Secondary | ICD-10-CM | POA: Diagnosis not present

## 2023-08-01 DIAGNOSIS — E782 Mixed hyperlipidemia: Secondary | ICD-10-CM | POA: Diagnosis not present

## 2023-08-01 DIAGNOSIS — G8929 Other chronic pain: Secondary | ICD-10-CM | POA: Diagnosis not present

## 2023-08-01 DIAGNOSIS — I10 Essential (primary) hypertension: Secondary | ICD-10-CM | POA: Diagnosis not present

## 2023-08-01 DIAGNOSIS — G301 Alzheimer's disease with late onset: Secondary | ICD-10-CM | POA: Diagnosis not present

## 2023-08-01 DIAGNOSIS — E1169 Type 2 diabetes mellitus with other specified complication: Secondary | ICD-10-CM | POA: Diagnosis not present

## 2023-08-21 ENCOUNTER — Other Ambulatory Visit: Payer: Self-pay

## 2023-08-21 DIAGNOSIS — E119 Type 2 diabetes mellitus without complications: Secondary | ICD-10-CM | POA: Insufficient documentation

## 2023-08-21 DIAGNOSIS — M1990 Unspecified osteoarthritis, unspecified site: Secondary | ICD-10-CM | POA: Insufficient documentation

## 2023-08-21 DIAGNOSIS — G8929 Other chronic pain: Secondary | ICD-10-CM | POA: Insufficient documentation

## 2023-08-21 DIAGNOSIS — L601 Onycholysis: Secondary | ICD-10-CM | POA: Insufficient documentation

## 2023-08-21 DIAGNOSIS — F32A Depression, unspecified: Secondary | ICD-10-CM | POA: Insufficient documentation

## 2023-08-21 DIAGNOSIS — G709 Myoneural disorder, unspecified: Secondary | ICD-10-CM | POA: Insufficient documentation

## 2023-08-21 DIAGNOSIS — J3089 Other allergic rhinitis: Secondary | ICD-10-CM | POA: Insufficient documentation

## 2023-08-21 DIAGNOSIS — Z01810 Encounter for preprocedural cardiovascular examination: Secondary | ICD-10-CM | POA: Insufficient documentation

## 2023-08-21 DIAGNOSIS — G2581 Restless legs syndrome: Secondary | ICD-10-CM | POA: Insufficient documentation

## 2023-08-21 DIAGNOSIS — Z973 Presence of spectacles and contact lenses: Secondary | ICD-10-CM | POA: Insufficient documentation

## 2023-08-21 DIAGNOSIS — M62830 Muscle spasm of back: Secondary | ICD-10-CM | POA: Insufficient documentation

## 2023-08-23 ENCOUNTER — Ambulatory Visit: Attending: Cardiology | Admitting: Cardiology

## 2023-08-23 ENCOUNTER — Encounter: Payer: Self-pay | Admitting: Cardiology

## 2023-08-23 VITALS — BP 112/68 | HR 65 | Ht <= 58 in | Wt 129.2 lb

## 2023-08-23 DIAGNOSIS — Z01810 Encounter for preprocedural cardiovascular examination: Secondary | ICD-10-CM | POA: Diagnosis not present

## 2023-08-23 DIAGNOSIS — I7143 Infrarenal abdominal aortic aneurysm, without rupture: Secondary | ICD-10-CM | POA: Diagnosis not present

## 2023-08-23 DIAGNOSIS — I10 Essential (primary) hypertension: Secondary | ICD-10-CM | POA: Diagnosis not present

## 2023-08-23 DIAGNOSIS — I7090 Unspecified atherosclerosis: Secondary | ICD-10-CM | POA: Diagnosis not present

## 2023-08-23 DIAGNOSIS — E782 Mixed hyperlipidemia: Secondary | ICD-10-CM

## 2023-08-23 NOTE — Patient Instructions (Signed)
 Medication Instructions:  Your physician recommends that you continue on your current medications as directed. Please refer to the Current Medication list given to you today.  *If you need a refill on your cardiac medications before your next appointment, please call your pharmacy*  Lab Work: None If you have labs (blood work) drawn today and your tests are completely normal, you will receive your results only by: MyChart Message (if you have MyChart) OR A paper copy in the mail If you have any lab test that is abnormal or we need to change your treatment, we will call you to review the results.  Testing/Procedures: Your physician has requested that you have an echocardiogram. Echocardiography is a painless test that uses sound waves to create images of your heart. It provides your doctor with information about the size and shape of your heart and how well your heart's chambers and valves are working. This procedure takes approximately one hour. There are no restrictions for this procedure. Please do NOT wear cologne, perfume, aftershave, or lotions (deodorant is allowed). Please arrive 15 minutes prior to your appointment time.  Please note: We ask at that you not bring children with you during ultrasound (echo/ vascular) testing. Due to room size and safety concerns, children are not allowed in the ultrasound rooms during exams. Our front office staff cannot provide observation of children in our lobby area while testing is being conducted. An adult accompanying a patient to their appointment will only be allowed in the ultrasound room at the discretion of the ultrasound technician under special circumstances. We apologize for any inconvenience.    [facility] [address] [place], [place] [address] Phone:  [phone]    Please arrive 15 minutes prior to your appointment time for registration and insurance purposes.  The test will take approximately 3 to  4 hours to complete; you may bring reading material.  If someone comes with you to your appointment, they will need to remain in the main lobby due to limited space in the testing area. **If you are pregnant or breastfeeding, please notify the nuclear lab prior to your appointment**  How to prepare for your Myocardial Perfusion Test: Do not eat or drink 3 hours prior to your test, except you may have water. Do not consume products containing caffeine (regular or decaffeinated) 12 hours prior to your test. (ex: coffee, chocolate, sodas, tea). Do bring a list of your current medications with you.  If not listed below, you may take your medications as normal. Do wear comfortable clothes (no dresses or overalls) and walking shoes, tennis shoes preferred (No heels or open toe shoes are allowed). Do NOT wear cologne, perfume, aftershave, or lotions (deodorant is allowed). If these instructions are not followed, your test will have to be rescheduled.  Please report to [address] for your test.  If you have questions or concerns about your appointment, you can call the [facility] at [phone].  If you cannot keep your appointment, please provide 24 hours notification to the Nuclear Lab, to avoid a possible $50 charge to your account.   Follow-Up: At [facility], you and your health needs are our priority.  As part of our continuing mission to provide you with exceptional heart care, our providers are all part of one team.  This team includes your primary Cardiologist (physician) and Advanced Practice Providers or APPs (Physician Assistants and Nurse Practitioners) who all work together to  provide you with the care you need, when you need it.  Your next appointment:   6 month(s)  Provider:   Jennifer Crape, MD    We recommend signing up for the patient portal called MyChart.  Sign up information is provided on this After Visit Summary.  MyChart  is used to connect with patients for Virtual Visits (Telemedicine).  Patients are able to view lab/test results, encounter notes, upcoming appointments, etc.  Non-urgent messages can be sent to your provider as well.   To learn more about what you can do with MyChart, go to [email or web address].   Other Instructions None

## 2023-08-23 NOTE — Progress Notes (Addendum)
 Cardiology Office Note:    Date:  08/23/2023   ID:  Kylie Hurst, Kylie Hurst 1945-02-17, MRN [id number]  PCP:  Silva JONELLE Standing, DO  Cardiologist:  Jennifer JONELLE Crape, MD   Referring MD: Sheree Penne Palma*    ASSESSMENT:    1. Preop cardiovascular exam   2. Infrarenal abdominal aortic aneurysm (AAA) without rupture (HCC)   3. Atherosclerosis   4. Primary hypertension   5. Mixed hyperlipidemia    PLAN:    In order of problems listed above:  Aortic atherosclerosis: Secondary prevention stressed with the patient.  Importance of compliance with diet medication stressed and patient verbalized standing. Preop cardiovascular evaluation: She has multiple risk factors for coronary artery disease so risk stratification will need to be done.  She will undergo Lexiscan  sestamibi.  If this is negative then she is not at high risk for coronary events during the aforementioned surgery.  Meticulous hemodynamic monitoring will further reduce the risk of coronary events. Cardiac murmur: Echocardiogram will be done to assess murmur heard on auscultation. Essential hypertension: Blood pressure stable and diet was emphasized. Mixed dyslipidemia: On lipid-lowering medications followed by primary care.  Goal LDL must be less than 70. Patient will be seen in follow-up appointment in 6 months or earlier if the patient has any concerns. \  Addendum: 09/05/2023: Stress test report has been negative.  In view of this she is not at high risk for coronary events during the aforementioned surgery.  As mentioned above meticulous hemodynamic monitoring will further reduce the risk of coronary events. Signed Dr. Jennifer Mabrey Howland     Medication Adjustments/Labs and Tests Ordered: Current medicines are reviewed at length with the patient today.  Concerns regarding medicines are outlined above.  Orders Placed This Encounter  Procedures   EKG 12-Lead   No orders of the defined types were placed in this  encounter.    History of Present Illness:    Kylie Hurst is a 78 y.o. female who is being seen today for the evaluation of preop assessment for abdominal aortic aneurysm intervention at the request of Sheree Penne Palma*.  Patient is a pleasant 78 year old female.  She has past medical history of smoking in the remote past.  She has history of essential hypertension dyslipidemia and is recently diagnosed to have abdominal aortic aneurysm.  She is planning to undergo intervention which cardiac evaluation and risk stratification has been requested.  She leads a sedentary lifestyle.  She denies any chest pain orthopnea or PND.  At the time of my evaluation, the patient is alert awake oriented and in no distress.  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   Allergic rhinitis due to pollen 05/26/2020   Aortic aneurysm (HCC) 2011   3.25mm   Arthritis    Atherosclerosis 05/26/2020   Back spasm    Benign neoplasm of colon 05/26/2020   Cervical spondylosis without myelopathy 12/02/2012   Chronic back pain    Chronic obstructive pulmonary disease, unspecified (HCC) 05/26/2020   Constipation 05/26/2020   Decreased estrogen level 05/26/2020   Dementia (HCC)    Depression    Diabetes mellitus (HCC) 07/17/2018   Diabetes mellitus without complication (HCC)    fasting 90-110   Diabetic neuropathy (HCC) 05/30/2013   Environmental and seasonal allergies    Hardening of the aorta (  main artery of the heart) (HCC) 05/26/2020   History of colonic polyps 05/26/2020   IMO SNOMED Dx Update Oct 2024     Hypercalcemia 05/26/2020   Hyperlipidemia    Hypertension    Hypokalemia 05/26/2020   Lumbosacral spondylosis without myelopathy 12/02/2012   Neuromuscular disorder (HCC)    Onycholysis of toenail     Osteoporosis    Ovarian cyst 06/01/2009   5 cm   Pain due to onychomycosis of toenails of both feet 07/17/2018   Preop cardiovascular exam    Rectocele 05/26/2020   Restless legs    Right lumbar radiculopathy 05/30/2013   Skin sensation disturbance 05/26/2020   Spigelian hernia    left ventral   Spondylolisthesis at L5-S1 level 12/02/2012   Tobacco dependence 05/26/2020   Vaginal bleeding 05/26/2020   Wears glasses     Past Surgical History:  Procedure Laterality Date   ANTERIOR CERVICAL DECOMP/DISCECTOMY FUSION N/A 12/18/2012   Procedure: ANTERIOR CERVICAL DECOMPRESSION/DISCECTOMY FUSION 1 LEVEL;  Surgeon: Oneil Rodgers Priestly, MD;  [facility];  Service: Orthopedics;  Laterality: N/A;  Anterior cervical decompression fusion, cervical 7 - thoracic 1 with instrumentation, allograft   COLONOSCOPY     INSERTION OF MESH Left 05/14/2014   Procedure: INSERTION OF MESH;  Surgeon: Donnice Lima, MD;  Location: [facility] OR;  Service: General;  Laterality: Left;   NECK SURGERY     SPIGELIAN HERNIA  05/14/2014   VENTRAL HERNIA REPAIR Left 05/14/2014   Procedure: ATTEMPTED LAPAROSCOPIC REPAIR LEFT SPIGLIAN HERINA CONVERTED TO OPEN REPAIR;  Surgeon: Donnice Lima, MD;  Location: [facility] OR;  Service: General;  Laterality: Left;    Current Medications: Current Meds  Medication Sig   albuterol  (VENTOLIN  HFA) 108 (90 Base) MCG/ACT inhaler Inhale 1 puff into the lungs every 4 (four) hours as needed for shortness of breath or wheezing.   ANORO ELLIPTA  62.5-25 MCG/INH AEPB Inhale 1 puff into the lungs in the morning.   aspirin  (ASPIRIN  CHILDRENS) 81 MG chewable tablet Chew 1 tablet (81 mg total) by mouth daily.   b complex vitamins tablet Take 1 tablet by mouth daily.   busPIRone (BUSPAR) 7.5 MG tablet Take 7.5 mg by mouth 2 (two) times daily.   cholecalciferol (VITAMIN D3) 25 MCG (1000 UNIT) tablet Take 2,000 Units by mouth daily.   escitalopram (LEXAPRO) 10 MG tablet Take 10 mg by mouth daily.    gabapentin  (NEURONTIN ) 300 MG capsule Take 150 mg by mouth at bedtime.   hydrochlorothiazide  (HYDRODIURIL ) 12.5 MG tablet Take 12.5 mg by mouth daily.   KLOR-CON  M20 20 MEQ tablet Take 20 mEq by mouth every evening.   lisinopril  (PRINIVIL ,ZESTRIL ) 5 MG tablet Take 5 mg by mouth at bedtime.    magnesium oxide (MAG-OX) 400 MG tablet Take 1 tablet by mouth daily.   polyethylene glycol-electrolytes (NULYTELY) 420 g solution See admin instructions.   pravastatin  (PRAVACHOL ) 20 MG tablet Take 20 mg by mouth at bedtime.    [DISCONTINUED] sodium chloride  (OCEAN) 0.65 % SOLN nasal spray Place 2 sprays into both nostrils at bedtime.     Allergies:   Other   Social History   Socioeconomic History   Marital status: Divorced    Spouse name: Not on file   Number of children: Not on file   Years of education: Not on file   Highest education level: Not on file  Occupational History   Not on file  Tobacco Use   Smoking status: Former    Current packs/day: 1.00  Average packs/day: 1 pack/day for 52.0 years (52.0 ttl pk-yrs)    Types: Cigarettes   Smokeless tobacco: Never   Tobacco comments:    Currently on Chantix   Vaping Use   Vaping status: Never Used  Substance and Sexual Activity   Alcohol use: No   Drug use: No   Sexual activity: Not on file  Other Topics Concern   Not on file  Social History Narrative   Not on file   Social Drivers of Health   Financial Resource Strain: Not on file  Food Insecurity: No Food Insecurity (12/27/2020)   [facility]   Hunger Vital Sign    Within the past 12 months, you worried that your food would run out before you got the money to buy more.: Never true    Within the past 12 months, the food you bought just didn't last and you didn't have money to get more.: Never true  Transportation Needs: Not on file  Physical Activity: Not on file  Stress: Not on file  Social Connections: Unknown (06/12/2021)   Received from [facility]    Social Network    Social Network: Not on file     Family History: The patient's family history includes Cancer in her sister; Diabetes in her mother; Epilepsy in her brother.  ROS:   Please see the history of present illness.    All other systems reviewed and are negative.  EKGs/Labs/Other Studies Reviewed:    The following studies were reviewed today:  EKG Interpretation Date/Time:  Thursday August 23 2023 09:58:12 EDT Ventricular Rate:  65 PR Interval:  184 QRS Duration:  60 QT Interval:  386 QTC Calculation: 401 R Axis:   39  Text Interpretation: Normal sinus rhythm Low voltage QRS Cannot rule out Anterior infarct (cited on or before 13-Dec-2012) Abnormal ECG When compared with ECG of 06-May-2014 14:59, Questionable change in initial forces of Septal leads Confirmed by Edwyna Backers [phone]) on 08/23/2023 10:15:36 AM     Recent Labs: No results found for requested labs within last 365 days.  Recent Lipid Panel    Component Value Date/Time   CHOL 141 12/07/2009 2050   TRIG 114 12/07/2009 2050   HDL 55 12/07/2009 2050   CHOLHDL 2.6 Ratio 12/07/2009 2050   VLDL 23 12/07/2009 2050   LDLCALC 63 12/07/2009 2050    Physical Exam:    VS:  BP 112/68   Pulse 65   Ht 4' 9 (1.448 m)   Wt 129 lb 3.2 oz (58.6 kg)   SpO2 94%   BMI 27.96 kg/m     Wt Readings from Last 3 Encounters:  08/23/23 129 lb 3.2 oz (58.6 kg)  07/11/23 128 lb 1.6 oz (58.1 kg)  08/23/22 116 lb (52.6 kg)     GEN: Patient is in no acute distress HEENT: Normal NECK: No JVD; No carotid bruits LYMPHATICS: No lymphadenopathy CARDIAC: S1 S2 regular, 2/6 systolic murmur at the apex. RESPIRATORY:  Clear to auscultation without rales, wheezing or rhonchi  ABDOMEN: Soft, non-tender, non-distended MUSCULOSKELETAL:  No edema; No deformity  SKIN: Warm and dry NEUROLOGIC:  Alert and oriented x 3 PSYCHIATRIC:  Normal affect    Signed, Backers JONELLE Edwyna, MD  08/23/2023 10:16 AM    [facility]

## 2023-08-28 ENCOUNTER — Telehealth (HOSPITAL_COMMUNITY): Payer: Self-pay | Admitting: *Deleted

## 2023-08-28 ENCOUNTER — Encounter (HOSPITAL_COMMUNITY): Payer: Self-pay | Admitting: *Deleted

## 2023-08-28 DIAGNOSIS — G309 Alzheimer's disease, unspecified: Secondary | ICD-10-CM | POA: Diagnosis not present

## 2023-08-28 DIAGNOSIS — N183 Chronic kidney disease, stage 3 unspecified: Secondary | ICD-10-CM | POA: Diagnosis not present

## 2023-08-28 NOTE — Telephone Encounter (Signed)
 Instructions for upcoming stress test sent via [employer].  Argentina Bees, RN

## 2023-08-29 DIAGNOSIS — G8929 Other chronic pain: Secondary | ICD-10-CM | POA: Diagnosis not present

## 2023-08-29 DIAGNOSIS — J449 Chronic obstructive pulmonary disease, unspecified: Secondary | ICD-10-CM | POA: Diagnosis not present

## 2023-08-29 DIAGNOSIS — E559 Vitamin D deficiency, unspecified: Secondary | ICD-10-CM | POA: Diagnosis not present

## 2023-08-29 DIAGNOSIS — I10 Essential (primary) hypertension: Secondary | ICD-10-CM | POA: Diagnosis not present

## 2023-08-29 DIAGNOSIS — G301 Alzheimer's disease with late onset: Secondary | ICD-10-CM | POA: Diagnosis not present

## 2023-08-31 ENCOUNTER — Other Ambulatory Visit: Payer: Self-pay | Admitting: Cardiology

## 2023-08-31 DIAGNOSIS — E782 Mixed hyperlipidemia: Secondary | ICD-10-CM

## 2023-08-31 DIAGNOSIS — I7143 Infrarenal abdominal aortic aneurysm, without rupture: Secondary | ICD-10-CM

## 2023-08-31 DIAGNOSIS — I10 Essential (primary) hypertension: Secondary | ICD-10-CM

## 2023-08-31 DIAGNOSIS — Z01810 Encounter for preprocedural cardiovascular examination: Secondary | ICD-10-CM

## 2023-08-31 DIAGNOSIS — I7090 Unspecified atherosclerosis: Secondary | ICD-10-CM

## 2023-09-03 ENCOUNTER — Ambulatory Visit (HOSPITAL_COMMUNITY)
Admission: RE | Admit: 2023-09-03 | Discharge: 2023-09-03 | Disposition: A | Discharge status: Home / Self Care | Source: Ambulatory Visit | Attending: Cardiology | Admitting: Cardiology

## 2023-09-03 ENCOUNTER — Ambulatory Visit: Payer: Self-pay | Admitting: Cardiology

## 2023-09-03 DIAGNOSIS — I10 Essential (primary) hypertension: Secondary | ICD-10-CM | POA: Insufficient documentation

## 2023-09-03 DIAGNOSIS — I7090 Unspecified atherosclerosis: Secondary | ICD-10-CM | POA: Diagnosis not present

## 2023-09-03 DIAGNOSIS — Z01810 Encounter for preprocedural cardiovascular examination: Secondary | ICD-10-CM | POA: Insufficient documentation

## 2023-09-03 DIAGNOSIS — I7143 Infrarenal abdominal aortic aneurysm, without rupture: Secondary | ICD-10-CM | POA: Insufficient documentation

## 2023-09-03 DIAGNOSIS — E782 Mixed hyperlipidemia: Secondary | ICD-10-CM | POA: Diagnosis not present

## 2023-09-03 LAB — ECHOCARDIOGRAM COMPLETE
Area-P 1/2: 3.21 cm2
S' Lateral: 1.8 cm

## 2023-09-04 ENCOUNTER — Ambulatory Visit (HOSPITAL_COMMUNITY)
Admission: RE | Admit: 2023-09-04 | Discharge: 2023-09-04 | Disposition: A | Discharge status: Home / Self Care | Source: Ambulatory Visit | Attending: Cardiology | Admitting: Cardiology

## 2023-09-04 DIAGNOSIS — Z01810 Encounter for preprocedural cardiovascular examination: Secondary | ICD-10-CM | POA: Diagnosis not present

## 2023-09-04 DIAGNOSIS — I7090 Unspecified atherosclerosis: Secondary | ICD-10-CM | POA: Insufficient documentation

## 2023-09-04 DIAGNOSIS — I10 Essential (primary) hypertension: Secondary | ICD-10-CM | POA: Insufficient documentation

## 2023-09-04 DIAGNOSIS — I7143 Infrarenal abdominal aortic aneurysm, without rupture: Secondary | ICD-10-CM | POA: Insufficient documentation

## 2023-09-04 DIAGNOSIS — E782 Mixed hyperlipidemia: Secondary | ICD-10-CM | POA: Insufficient documentation

## 2023-09-04 LAB — MYOCARDIAL PERFUSION IMAGING
LV dias vol: 51 mL (ref 46–106)
LV sys vol: 8 mL (ref 3.8–5.2)
Nuc Stress EF: 84 %
Peak HR: 88 {beats}/min
Rest HR: 63 {beats}/min
Rest Nuclear Isotope Dose: 10.1 mCi
SDS: 0
SRS: 0
SSS: 0
ST Depression (mm): 0 mm
Stress Nuclear Isotope Dose: 31.9 mCi
TID: 1.1

## 2023-09-04 MED ORDER — REGADENOSON 0.4 MG/5ML IV SOLN
INTRAVENOUS | Status: AC
  Filled 2023-09-04: qty 5

## 2023-09-04 MED ORDER — REGADENOSON 0.4 MG/5ML IV SOLN
0.4000 mg | Freq: Once | INTRAVENOUS | Status: AC
  Administered 2023-09-04: 0.4 mg via INTRAVENOUS

## 2023-09-04 MED ORDER — TECHNETIUM TC 99M TETROFOSMIN IV KIT
10.1000 | PACK | Freq: Once | INTRAVENOUS | Status: AC | PRN
  Administered 2023-09-04: 10.1 via INTRAVENOUS

## 2023-09-04 MED ORDER — TECHNETIUM TC 99M TETROFOSMIN IV KIT
31.9000 | PACK | Freq: Once | INTRAVENOUS | Status: AC | PRN
  Administered 2023-09-04: 31.9 via INTRAVENOUS

## 2023-09-05 DIAGNOSIS — J441 Chronic obstructive pulmonary disease with (acute) exacerbation: Secondary | ICD-10-CM | POA: Diagnosis not present

## 2023-09-05 NOTE — Telephone Encounter (Signed)
 Results reviewed with Tammy per DPR as per Dr. Posey note. Tammy verbalized understanding and had no additional questions. Routed to PCP.

## 2023-09-10 ENCOUNTER — Ambulatory Visit: Payer: Self-pay | Admitting: Cardiology

## 2023-09-11 DIAGNOSIS — I7090 Unspecified atherosclerosis: Secondary | ICD-10-CM | POA: Diagnosis not present

## 2023-09-12 DIAGNOSIS — J441 Chronic obstructive pulmonary disease with (acute) exacerbation: Secondary | ICD-10-CM | POA: Diagnosis not present

## 2023-09-12 DIAGNOSIS — Z1383 Encounter for screening for respiratory disorder NEC: Secondary | ICD-10-CM | POA: Diagnosis not present

## 2023-09-12 LAB — HEPATIC FUNCTION PANEL
ALT: 17 IU/L (ref 0–32)
AST: 23 IU/L (ref 0–40)
Albumin: 4.3 g/dL (ref 3.8–4.8)
Alkaline Phosphatase: 58 IU/L (ref 44–121)
Bilirubin Total: 0.5 mg/dL (ref 0.0–1.2)
Bilirubin, Direct: 0.17 mg/dL (ref 0.00–0.40)
Total Protein: 6.8 g/dL (ref 6.0–8.5)

## 2023-09-12 LAB — LIPID PANEL
Chol/HDL Ratio: 3.3 ratio (ref 0.0–4.4)
Cholesterol, Total: 156 mg/dL (ref 100–199)
HDL: 47 mg/dL (ref >39)
LDL Chol Calc (NIH): 87 mg/dL (ref 0–99)
Triglycerides: 123 mg/dL (ref 0–149)
VLDL Cholesterol Cal: 22 mg/dL (ref 5–40)

## 2023-09-19 DIAGNOSIS — J449 Chronic obstructive pulmonary disease, unspecified: Secondary | ICD-10-CM | POA: Diagnosis not present

## 2023-09-19 DIAGNOSIS — G309 Alzheimer's disease, unspecified: Secondary | ICD-10-CM | POA: Diagnosis not present

## 2023-09-19 DIAGNOSIS — E119 Type 2 diabetes mellitus without complications: Secondary | ICD-10-CM | POA: Diagnosis not present

## 2023-09-24 ENCOUNTER — Other Ambulatory Visit: Payer: Self-pay

## 2023-09-24 DIAGNOSIS — I7143 Infrarenal abdominal aortic aneurysm, without rupture: Secondary | ICD-10-CM

## 2023-09-25 DIAGNOSIS — N183 Chronic kidney disease, stage 3 unspecified: Secondary | ICD-10-CM | POA: Diagnosis not present

## 2023-09-25 DIAGNOSIS — J449 Chronic obstructive pulmonary disease, unspecified: Secondary | ICD-10-CM | POA: Diagnosis not present

## 2023-09-25 DIAGNOSIS — G309 Alzheimer's disease, unspecified: Secondary | ICD-10-CM | POA: Diagnosis not present

## 2023-09-25 DIAGNOSIS — Z87891 Personal history of nicotine dependence: Secondary | ICD-10-CM | POA: Diagnosis not present

## 2023-09-26 DIAGNOSIS — Z79899 Other long term (current) drug therapy: Secondary | ICD-10-CM | POA: Diagnosis not present

## 2023-09-26 DIAGNOSIS — G301 Alzheimer's disease with late onset: Secondary | ICD-10-CM | POA: Diagnosis not present

## 2023-10-09 ENCOUNTER — Encounter (HOSPITAL_COMMUNITY): Payer: Self-pay

## 2023-10-09 NOTE — Pre-Procedure Instructions (Signed)
 Surgical Instructions   Your procedure is scheduled on October 12, 2023. Report to [facility] Main Entrance A at 8:20 A.M., then check in with the Admitting office. Any questions or running late day of surgery: call [phone]  Questions prior to your surgery date: call [phone], Monday-Friday, 8am-4pm. If you experience any cold or flu symptoms such as cough, fever, chills, shortness of breath, etc. between now and your scheduled surgery, please notify us  at the above number.     Remember:  Do not eat or drink after midnight the night before your surgery    Take these medicines the morning of surgery with A SIP OF WATER: ANORO ELLIPTA  Inhaler aspirin   busPIRone  (BUSPAR )  escitalopram  (LEXAPRO )    May take these medicines IF NEEDED: albuterol  (VENTOLIN  HFA) inhaler - please bring your inhaler with you morning of surgery   One week prior to surgery, STOP taking any Aspirin  (unless otherwise instructed by your surgeon) Aleve, Naproxen, Ibuprofen, Motrin, Advil, Goody's, BC's, all herbal medications, fish oil , and non-prescription vitamins.                     Do NOT Smoke (Tobacco/Vaping) for 24 hours prior to your procedure.  If you use a CPAP at night, you may bring your mask/headgear for your overnight stay.   You will be asked to remove any contacts, glasses, piercing's, hearing aid's, dentures/partials prior to surgery. Please bring cases for these items if needed.    Patients discharged the day of surgery will not be allowed to drive home, and someone needs to stay with them for 24 hours.  SURGICAL WAITING ROOM VISITATION Patients may have no more than 2 support people in the waiting area - these visitors may rotate.   Pre-op nurse will coordinate an appropriate time for 1 ADULT support person, who may not rotate, to accompany patient in pre-op.  Children under the age of 15 must have an adult with them who is not the patient and must remain in the main waiting  area with an adult.  If the patient needs to stay at the hospital during part of their recovery, the visitor guidelines for inpatient rooms apply.  Please refer to the [facility] website for the visitor guidelines for any additional information.   If you received a COVID test during your pre-op visit  it is requested that you wear a mask when out in public, stay away from anyone that may not be feeling well and notify your surgeon if you develop symptoms. If you have been in contact with anyone that has tested positive in the last 10 days please notify you surgeon.      Pre-operative CHG Bathing Instructions   You can play a key role in reducing the risk of infection after surgery. Your skin needs to be as free of germs as possible. You can reduce the number of germs on your skin by washing with CHG (chlorhexidine  gluconate) soap before surgery. CHG is an antiseptic soap that kills germs and continues to kill germs even after washing.   DO NOT use if you have an allergy to chlorhexidine /CHG or antibacterial soaps. If your skin becomes reddened or irritated, stop using the CHG and notify one of our RNs at [phone].              TAKE A SHOWER THE NIGHT BEFORE SURGERY AND THE DAY OF SURGERY    Please keep in mind the following:  DO NOT shave, including  legs and underarms, 48 hours prior to surgery.   You may shave your face before/day of surgery.  Place clean sheets on your bed the night before surgery Use a clean washcloth (not used since being washed) for each shower. DO NOT sleep with pet's night before surgery.  CHG Shower Instructions:  Wash your face and private area with normal soap. If you choose to wash your hair, wash first with your normal shampoo.  After you use shampoo/soap, rinse your hair and body thoroughly to remove shampoo/soap residue.  Turn the water OFF and apply half the bottle of CHG soap to a CLEAN washcloth.  Apply CHG soap ONLY FROM YOUR NECK DOWN TO YOUR  TOES (washing for 3-5 minutes)  DO NOT use CHG soap on face, private areas, open wounds, or sores.  Pay special attention to the area where your surgery is being performed.  If you are having back surgery, having someone wash your back for you may be helpful. Wait 2 minutes after CHG soap is applied, then you may rinse off the CHG soap.  Pat dry with a clean towel  Put on clean pajamas    Additional instructions for the day of surgery: DO NOT APPLY any lotions, deodorants, cologne, or perfumes.   Do not wear jewelry or makeup Do not wear nail polish, gel polish, artificial nails, or any other type of covering on natural nails (fingers and toes) Do not bring valuables to the hospital. [facility] is not responsible for valuables/personal belongings. Put on clean/comfortable clothes.  Please brush your teeth.  Ask your nurse before applying any prescription medications to the skin.

## 2023-10-10 ENCOUNTER — Encounter (HOSPITAL_COMMUNITY): Payer: Self-pay

## 2023-10-10 ENCOUNTER — Encounter (HOSPITAL_COMMUNITY)
Admission: RE | Admit: 2023-10-10 | Discharge: 2023-10-10 | Disposition: A | Discharge status: Home / Self Care | Source: Ambulatory Visit | Attending: Vascular Surgery | Admitting: Vascular Surgery

## 2023-10-10 ENCOUNTER — Other Ambulatory Visit: Payer: Self-pay

## 2023-10-10 VITALS — BP 121/78 | HR 66 | Temp 97.9°F | Resp 17 | Ht <= 58 in | Wt 126.1 lb

## 2023-10-10 DIAGNOSIS — F03A0 Unspecified dementia, mild, without behavioral disturbance, psychotic disturbance, mood disturbance, and anxiety: Secondary | ICD-10-CM | POA: Insufficient documentation

## 2023-10-10 DIAGNOSIS — E1151 Type 2 diabetes mellitus with diabetic peripheral angiopathy without gangrene: Secondary | ICD-10-CM | POA: Diagnosis not present

## 2023-10-10 DIAGNOSIS — I70203 Unspecified atherosclerosis of native arteries of extremities, bilateral legs: Secondary | ICD-10-CM | POA: Diagnosis not present

## 2023-10-10 DIAGNOSIS — Z860100 Personal history of colon polyps, unspecified: Secondary | ICD-10-CM | POA: Diagnosis not present

## 2023-10-10 DIAGNOSIS — J449 Chronic obstructive pulmonary disease, unspecified: Secondary | ICD-10-CM | POA: Insufficient documentation

## 2023-10-10 DIAGNOSIS — I7143 Infrarenal abdominal aortic aneurysm, without rupture: Secondary | ICD-10-CM | POA: Diagnosis not present

## 2023-10-10 DIAGNOSIS — I7409 Other arterial embolism and thrombosis of abdominal aorta: Secondary | ICD-10-CM | POA: Diagnosis not present

## 2023-10-10 DIAGNOSIS — E119 Type 2 diabetes mellitus without complications: Secondary | ICD-10-CM | POA: Insufficient documentation

## 2023-10-10 DIAGNOSIS — Z87891 Personal history of nicotine dependence: Secondary | ICD-10-CM | POA: Diagnosis not present

## 2023-10-10 DIAGNOSIS — R0902 Hypoxemia: Secondary | ICD-10-CM | POA: Diagnosis not present

## 2023-10-10 DIAGNOSIS — Z8673 Personal history of transient ischemic attack (TIA), and cerebral infarction without residual deficits: Secondary | ICD-10-CM | POA: Diagnosis not present

## 2023-10-10 DIAGNOSIS — Z9109 Other allergy status, other than to drugs and biological substances: Secondary | ICD-10-CM | POA: Diagnosis not present

## 2023-10-10 DIAGNOSIS — Z23 Encounter for immunization: Secondary | ICD-10-CM | POA: Diagnosis not present

## 2023-10-10 DIAGNOSIS — Z01812 Encounter for preprocedural laboratory examination: Secondary | ICD-10-CM | POA: Insufficient documentation

## 2023-10-10 DIAGNOSIS — K449 Diaphragmatic hernia without obstruction or gangrene: Secondary | ICD-10-CM | POA: Insufficient documentation

## 2023-10-10 DIAGNOSIS — J439 Emphysema, unspecified: Secondary | ICD-10-CM | POA: Diagnosis not present

## 2023-10-10 DIAGNOSIS — Z833 Family history of diabetes mellitus: Secondary | ICD-10-CM | POA: Diagnosis not present

## 2023-10-10 DIAGNOSIS — E1140 Type 2 diabetes mellitus with diabetic neuropathy, unspecified: Secondary | ICD-10-CM | POA: Diagnosis not present

## 2023-10-10 DIAGNOSIS — D62 Acute posthemorrhagic anemia: Secondary | ICD-10-CM | POA: Diagnosis not present

## 2023-10-10 DIAGNOSIS — Z122 Encounter for screening for malignant neoplasm of respiratory organs: Secondary | ICD-10-CM | POA: Diagnosis not present

## 2023-10-10 DIAGNOSIS — Z01818 Encounter for other preprocedural examination: Secondary | ICD-10-CM

## 2023-10-10 DIAGNOSIS — Z981 Arthrodesis status: Secondary | ICD-10-CM | POA: Diagnosis not present

## 2023-10-10 DIAGNOSIS — I10 Essential (primary) hypertension: Secondary | ICD-10-CM | POA: Diagnosis not present

## 2023-10-10 DIAGNOSIS — J810 Acute pulmonary edema: Secondary | ICD-10-CM | POA: Diagnosis not present

## 2023-10-10 DIAGNOSIS — Z8679 Personal history of other diseases of the circulatory system: Secondary | ICD-10-CM | POA: Diagnosis not present

## 2023-10-10 DIAGNOSIS — G2581 Restless legs syndrome: Secondary | ICD-10-CM | POA: Diagnosis not present

## 2023-10-10 DIAGNOSIS — E785 Hyperlipidemia, unspecified: Secondary | ICD-10-CM | POA: Diagnosis not present

## 2023-10-10 DIAGNOSIS — M810 Age-related osteoporosis without current pathological fracture: Secondary | ICD-10-CM | POA: Diagnosis not present

## 2023-10-10 HISTORY — DX: Dependence on other enabling machines and devices: Z99.89

## 2023-10-10 HISTORY — DX: Dyspnea, unspecified: R06.00

## 2023-10-10 HISTORY — DX: Pneumonia, unspecified organism: J18.9

## 2023-10-10 HISTORY — DX: Cerebral infarction, unspecified: I63.9

## 2023-10-10 HISTORY — DX: Peripheral vascular disease, unspecified: I73.9

## 2023-10-10 LAB — CBC
HCT: 44.4 % (ref 36.0–46.0)
Hemoglobin: 14.8 g/dL (ref 12.0–15.0)
MCH: 31.7 pg (ref 26.0–34.0)
MCHC: 33.3 g/dL (ref 30.0–36.0)
MCV: 95.1 fL (ref 80.0–100.0)
Platelets: 176 K/uL (ref 150–400)
RBC: 4.67 MIL/uL (ref 3.87–5.11)
RDW: 13.2 % (ref 11.5–15.5)
WBC: 6.4 K/uL (ref 4.0–10.5)
nRBC: 0 % (ref 0.0–0.2)

## 2023-10-10 LAB — URINALYSIS, ROUTINE W REFLEX MICROSCOPIC
Bilirubin Urine: NEGATIVE
Glucose, UA: NEGATIVE mg/dL
Hgb urine dipstick: NEGATIVE
Ketones, ur: NEGATIVE mg/dL
Leukocytes,Ua: NEGATIVE
Nitrite: NEGATIVE
Protein, ur: NEGATIVE mg/dL
Specific Gravity, Urine: 1.004 — ABNORMAL LOW (ref 1.005–1.030)
pH: 7 (ref 5.0–8.0)

## 2023-10-10 LAB — COMPREHENSIVE METABOLIC PANEL WITH GFR
ALT: 19 U/L (ref 0–44)
AST: 23 U/L (ref 15–41)
Albumin: 4 g/dL (ref 3.5–5.0)
Alkaline Phosphatase: 46 U/L (ref 38–126)
Anion gap: 8 (ref 5–15)
BUN: 15 mg/dL (ref 8–23)
CO2: 26 mmol/L (ref 22–32)
Calcium: 9.6 mg/dL (ref 8.9–10.3)
Chloride: 104 mmol/L (ref 98–111)
Creatinine, Ser: 0.93 mg/dL (ref 0.44–1.00)
GFR, Estimated: >60 mL/min (ref >60)
Glucose, Bld: 101 mg/dL — ABNORMAL HIGH (ref 70–99)
Potassium: 4.3 mmol/L (ref 3.5–5.1)
Sodium: 138 mmol/L (ref 135–145)
Total Bilirubin: 0.7 mg/dL (ref 0.0–1.2)
Total Protein: 7.1 g/dL (ref 6.5–8.1)

## 2023-10-10 LAB — PROTIME-INR
INR: 1 (ref 0.8–1.2)
Prothrombin Time: 14 s (ref 11.4–15.2)

## 2023-10-10 LAB — SURGICAL PCR SCREEN
MRSA, PCR: NEGATIVE
Staphylococcus aureus: NEGATIVE

## 2023-10-10 LAB — APTT: aPTT: 31 s (ref 24–36)

## 2023-10-10 LAB — GLUCOSE, CAPILLARY: Glucose-Capillary: 124 mg/dL — ABNORMAL HIGH (ref 70–99)

## 2023-10-10 NOTE — Progress Notes (Signed)
 PCP - [facility], DO  Cardiologist - Dr Jennifer Crape  Chest x-ray - n/a EKG - 08/23/23 Stress Test - 09/03/23 (Neg Test) ECHO - 09/03/23 Cardiac Cath - n/a  ICD Pacemaker/Loop - n/a  Sleep Study -  n/a  Diabetes Type 2, no meds, does not check blood sugar  Blood Thinner Instructions:  none  Aspirin  Instructions: Continue per MD  NPO  Anesthesia review: Yes  STOP now taking any Aspirin  (unless otherwise instructed by your surgeon), Aleve, Naproxen, Ibuprofen, Motrin, Advil, Goody's, BC's, all herbal medications, fish oil , and all vitamins.   Coronavirus Screening Do you have any of the following symptoms:  Cough yes/no: No Fever (>100.52F)  yes/no: No Runny nose yes/no: No Sore throat yes/no: No Difficulty breathing/shortness of breath  Yes-COPD  Have you traveled in the last 14 days and where? yes/no: No  Patient verbalized understanding of instructions that were given to them at the PAT appointment. Patient was also instructed that they will need to review over the PAT instructions again at home before surgery.

## 2023-10-10 NOTE — Pre-Procedure Instructions (Signed)
 Surgical Instructions   Your procedure is scheduled on Friday, October 12, 2023. Report to [facility] Main Entrance A at 8:20 A.M., then check in with the Admitting office. Any questions or running late day of surgery: call [phone]  Questions prior to your surgery date: call [phone], Monday-Friday, 8am-4pm. If you experience any cold or flu symptoms such as cough, fever, chills, shortness of breath, etc. between now and your scheduled surgery, please notify us  at the above number.     Remember:  Do not eat or drink after midnight the night before your surgery-Thursday    Take these medicines the morning of surgery with A SIP OF WATER: BREZTRI  AEROSPHERE Inhaler aspirin   busPIRone  (BUSPAR )  escitalopram  (LEXAPRO )  zyrtec Flonase  nasal spray  May take these medicines IF NEEDED: albuterol  (VENTOLIN  HFA) inhaler - please bring your inhaler with you morning of surgery Tylenol    One week prior to surgery, STOP taking any Aspirin  (unless otherwise instructed by your surgeon) Aleve, Naproxen, Ibuprofen, Motrin, Advil, Goody's, BC's, all herbal medications, fish oil , and non-prescription vitamins.            You will be asked to remove any contacts, glasses, piercing's, hearing aid's, dentures/partials prior to surgery. Please bring cases for these items if needed.     SURGICAL WAITING ROOM VISITATION Patients may have no more than 2 support people in the waiting area - these visitors may rotate.   Pre-op nurse will coordinate an appropriate time for 1 ADULT support person, who may not rotate, to accompany patient in pre-op.  Children under the age of 65 must have an adult with them who is not the patient and must remain in the main waiting area with an adult.  If the patient needs to stay at the hospital during part of their recovery, the visitor guidelines for inpatient rooms apply.  MD will decide on date of  discharge.   Please refer to the [facility] website for  the visitor guidelines for any additional information.   If you received a COVID test during your pre-op visit  it is requested that you wear a mask when out in public, stay away from anyone that may not be feeling well and notify your surgeon if you develop symptoms. If you have been in contact with anyone that has tested positive in the last 10 days please notify you surgeon.      Pre-operative CHG Bathing Instructions   You can play a key role in reducing the risk of infection after surgery. Your skin needs to be as free of germs as possible. You can reduce the number of germs on your skin by washing with CHG (chlorhexidine  gluconate) soap before surgery. CHG is an antiseptic soap that kills germs and continues to kill germs even after washing.   DO NOT use if you have an allergy to chlorhexidine /CHG or antibacterial soaps. If your skin becomes reddened or irritated, stop using the CHG and notify one of our RNs at [phone].              TAKE A SHOWER THE NIGHT BEFORE SURGERY AND THE DAY OF SURGERY    Please keep in mind the following:  DO NOT shave, including legs and underarms, 48 hours prior to surgery.   You may shave your face before/day of surgery.  Place clean sheets on your bed the night before surgery Use a clean washcloth (not used since being washed) for each shower. DO NOT sleep with pet's night  before surgery.  CHG Shower Instructions:  Wash your face and private area with normal soap. If you choose to wash your hair, wash first with your normal shampoo.  After you use shampoo/soap, rinse your hair and body thoroughly to remove shampoo/soap residue.  Turn the water OFF and apply half the bottle of CHG soap to a CLEAN washcloth.  Apply CHG soap ONLY FROM YOUR NECK DOWN TO YOUR TOES (washing for 3-5 minutes)  DO NOT use CHG soap on face, private areas, open wounds, or sores.  Pay special attention to the area where your surgery is being performed.  If you are having  back surgery, having someone wash your back for you may be helpful. Wait 2 minutes after CHG soap is applied, then you may rinse off the CHG soap.  Pat dry with a clean towel  Put on clean pajamas    Additional instructions for the day of surgery: DO NOT APPLY any lotions, deodorants, cologne, or perfumes.   Do not wear jewelry or makeup Do not wear nail polish, gel polish, artificial nails, or any other type of covering on natural nails (fingers and toes) Do not bring valuables to the hospital. [facility] is not responsible for valuables/personal belongings. Put on clean/comfortable clothes.  Please brush your teeth.  Ask your nurse before applying any prescription medications to the skin.

## 2023-10-11 LAB — HEMOGLOBIN A1C
Hgb A1c MFr Bld: 5.7 % — ABNORMAL HIGH (ref 4.8–5.6)
Mean Plasma Glucose: 116.89 mg/dL

## 2023-10-11 NOTE — Progress Notes (Signed)
 Anesthesia Chart Review:  78 year old female follows with pulmonology at [facility] for history of smoking with associated COPD.  Per notes, 112-pack-year history, quit 06/2020.  Last seen by Dr. Javaid 09/25/2023.  PFTs at that time showed: There is a moderate obstructive ventilatory defect. The TLC is moderately reduced. There is a severe reduction of the diffusing capacity. There is NO significant response to bronchodilators.  Anoro Ellipta  was discontinued and she was started on Breztri  2 puffs twice daily as well as as needed albuterol .  She is not on supplemental oxygen.  Patient was seen by cardiologist Dr. Revankar 08/23/2023 for preop cardiovascular exam prior to undergoing EVAR for 5.1 cm abdominal aortic aneurysm.  HTN, echo and nuclear stress were ordered.  Echo 09/03/2023 showed LVEF 65 to 70%, normal wall motion, normal RV, moderate calcification of the AV without evidence of stenosis.  Nuclear stress 09/04/2023 was negative for ischemia, low risk.  Dr. Revankar addended his note on 09/05/2023 stating, Stress test report has been negative.  In view of this she is not at high risk for coronary events during the aforementioned surgery.  As mentioned above meticulous hemodynamic monitoring will further reduce the risk of coronary events.  Other pertinent history includes diet-controlled DM2, mild dementia, HTN, small hiatal hernia, s/p C7-T1 ACDF 2014.  Preop labs reviewed, WNL.  EKG 08/03/2023: Normal sinus rhythm.  Rate 65. Low voltage QRS. Cannot rule out Anterior infarct (cited on or before 13-Dec-2012)  Nuclear stress 09/04/2023:   The study is normal. The study is low risk.   No ST deviation was noted.   Left ventricular function is normal. End diastolic cavity size is normal.   CT images were obtained for attenuation correction and were examined for the presence of coronary calcium  when appropriate.   Coronary calcium  was present on the attenuation correction CT images. Severe coronary  calcifications were present. Coronary calcifications were present in the left anterior descending artery and right coronary artery distribution(s).   Prior study not available for comparison.  TTE 09/03/2023: 1. Left ventricular ejection fraction, by estimation, is 65 to 70%. Left  ventricular ejection fraction by 3D volume is 67 %. The left ventricle has  normal function. The left ventricle has no regional wall motion  abnormalities. There is mild left  ventricular hypertrophy of the basal-septal segment. Left ventricular  diastolic parameters were normal. The average left ventricular global  longitudinal strain is -27.3 %. The global longitudinal strain is normal.   2. Right ventricular systolic function is normal. The right ventricular  size is normal. There is normal pulmonary artery systolic pressure. The  estimated right ventricular systolic pressure is 27.4 mmHg.   3. The mitral valve is degenerative. Trivial mitral valve regurgitation.  No evidence of mitral stenosis.   4. The aortic valve is calcified. There is moderate calcification of the  aortic valve. There is moderate thickening of the aortic valve. Aortic  valve regurgitation is not visualized. Aortic valve  sclerosis/calcification is present, without any evidence  of aortic stenosis.   5. The inferior vena cava is normal in size with greater than 50%  respiratory variability, suggesting right atrial pressure of 3 mmHg.       Lynwood Geofm [facility]/Anesthesiology Phone [phone] 10/11/2023 10:24 AM

## 2023-10-11 NOTE — Anesthesia Preprocedure Evaluation (Signed)
 Anesthesia Evaluation  Patient identified by MRN, date of birth, ID band Patient awake    Reviewed: Allergy & Precautions, NPO status , Patient's Chart, lab work & pertinent test results, reviewed documented beta blocker date and time   History of Anesthesia Complications Negative for: history of anesthetic complications  Airway Mallampati: III  TM Distance: >3 FB   Mouth opening: Limited Mouth Opening  Dental  (+) Edentulous Upper, Edentulous Lower   Pulmonary shortness of breath and with exertion, pneumonia, COPD (severe COPD),  COPD inhaler, former smoker   breath sounds clear to auscultation       Cardiovascular hypertension, + Peripheral Vascular Disease  (-) CAD, (-) Past MI and (-) Cardiac Stents  Rhythm:Regular Rate:Normal  IMPRESSIONS     1. Left ventricular ejection fraction, by estimation, is 65 to 70%. Left  ventricular ejection fraction by 3D volume is 67 %. The left ventricle has  normal function. The left ventricle has no regional wall motion  abnormalities. There is mild left  ventricular hypertrophy of the basal-septal segment. Left ventricular  diastolic parameters were normal. The average left ventricular global  longitudinal strain is -27.3 %. The global longitudinal strain is normal.   2. Right ventricular systolic function is normal. The right ventricular  size is normal. There is normal pulmonary artery systolic pressure. The  estimated right ventricular systolic pressure is 27.4 mmHg.   3. The mitral valve is degenerative. Trivial mitral valve regurgitation.  No evidence of mitral stenosis.   4. The aortic valve is calcified. There is moderate calcification of the  aortic valve. There is moderate thickening of the aortic valve. Aortic  valve regurgitation is not visualized. Aortic valve  sclerosis/calcification is present, without any evidence  of aortic stenosis.   5. The inferior vena cava is normal  in size with greater than 50%  respiratory variability, suggesting right atrial pressure of 3 mmHg.    The study is normal. The study is low risk.    No ST deviation was noted.    Left ventricular function is normal. End diastolic cavity size is  normal.   CT images were obtained for attenuation correction and were examined  for the presence of coronary calcium  when appropriate.    Coronary calcium  was present on the attenuation correction CT images.  Severe coronary calcifications were present. Coronary calcifications were  present in the left anterior descending artery and right coronary artery  distribution(s). ...      Neuro/Psych neg Seizures PSYCHIATRIC DISORDERS  Depression   Dementia TIA Neuromuscular disease CVA, No Residual Symptoms    GI/Hepatic ,,,(+) neg Cirrhosis        Endo/Other  diabetes    Renal/GU Renal disease     Musculoskeletal  (+) Arthritis ,    Abdominal   Peds  Hematology   Anesthesia Other Findings   Reproductive/Obstetrics                              Anesthesia Physical Anesthesia Plan  ASA: 3  Anesthesia Plan: General   Post-op Pain Management:    Induction: Intravenous  PONV Risk Score and Plan: 2 and Ondansetron  and Dexamethasone   Airway Management Planned: Oral ETT  Additional Equipment: Arterial line  Intra-op Plan:   Post-operative Plan: Extubation in OR  Informed Consent: I have reviewed the patients History and Physical, chart, labs and discussed the procedure including the risks, benefits and alternatives for the proposed anesthesia  with the patient or authorized representative who has indicated his/her understanding and acceptance.     Dental advisory given  Plan Discussed with: CRNA  Anesthesia Plan Comments: (PAT note by Lynwood Hope, PA-C: 78 year old female follows with pulmonology at [facility] for history of smoking with associated COPD.  Per notes, 112-pack-year history, quit  06/2020.  Last seen by Dr. Javaid 09/25/2023.  PFTs at that time showed: There is a moderate obstructive ventilatory defect. The TLC is moderately reduced. There is a severe reduction of the diffusing capacity. There is NO significant response to bronchodilators.  Anoro Ellipta  was discontinued and she was started on Breztri  2 puffs twice daily as well as as needed albuterol .  She is not on supplemental oxygen.  Patient was seen by cardiologist Dr. Revankar 08/23/2023 for preop cardiovascular exam prior to undergoing EVAR for 5.1 cm abdominal aortic aneurysm.  HTN, echo and nuclear stress were ordered.  Echo 09/03/2023 showed LVEF 65 to 70%, normal wall motion, normal RV, moderate calcification of the AV without evidence of stenosis.  Nuclear stress 09/04/2023 was negative for ischemia, low risk.  Dr. Revankar addended his note on 09/05/2023 stating, Stress test report has been negative.  In view of this she is not at high risk for coronary events during the aforementioned surgery.  As mentioned above meticulous hemodynamic monitoring will further reduce the risk of coronary events.  Other pertinent history includes diet-controlled DM2, mild dementia, HTN, small hiatal hernia, s/p C7-T1 ACDF 2014.  Preop labs reviewed, WNL.  EKG 08/03/2023: Normal sinus rhythm.  Rate 65. Low voltage QRS. Cannot rule out Anterior infarct (cited on or before 13-Dec-2012)  Nuclear stress 09/04/2023:   The study is normal. The study is low risk.   No ST deviation was noted.   Left ventricular function is normal. End diastolic cavity size is normal.   CT images were obtained for attenuation correction and were examined for the presence of coronary calcium  when appropriate.   Coronary calcium  was present on the attenuation correction CT images. Severe coronary calcifications were present. Coronary calcifications were present in the left anterior descending artery and right coronary artery distribution(s).   Prior study not  available for comparison.  TTE 09/03/2023: 1. Left ventricular ejection fraction, by estimation, is 65 to 70%. Left  ventricular ejection fraction by 3D volume is 67 %. The left ventricle has  normal function. The left ventricle has no regional wall motion  abnormalities. There is mild left  ventricular hypertrophy of the basal-septal segment. Left ventricular  diastolic parameters were normal. The average left ventricular global  longitudinal strain is -27.3 %. The global longitudinal strain is normal.  2. Right ventricular systolic function is normal. The right ventricular  size is normal. There is normal pulmonary artery systolic pressure. The  estimated right ventricular systolic pressure is 27.4 mmHg.  3. The mitral valve is degenerative. Trivial mitral valve regurgitation.  No evidence of mitral stenosis.  4. The aortic valve is calcified. There is moderate calcification of the  aortic valve. There is moderate thickening of the aortic valve. Aortic  valve regurgitation is not visualized. Aortic valve  sclerosis/calcification is present, without any evidence  of aortic stenosis.  5. The inferior vena cava is normal in size with greater than 50%  respiratory variability, suggesting right atrial pressure of 3 mmHg.   )         Anesthesia Quick Evaluation

## 2023-10-12 ENCOUNTER — Inpatient Hospital Stay (HOSPITAL_COMMUNITY)

## 2023-10-12 ENCOUNTER — Other Ambulatory Visit: Payer: Self-pay

## 2023-10-12 ENCOUNTER — Encounter (HOSPITAL_COMMUNITY)
Admission: RE | Disposition: A | Discharge status: Home Health | Payer: Self-pay | Source: Home / Self Care | Attending: Vascular Surgery

## 2023-10-12 ENCOUNTER — Inpatient Hospital Stay (HOSPITAL_COMMUNITY): Admitting: Physician Assistant

## 2023-10-12 ENCOUNTER — Inpatient Hospital Stay (HOSPITAL_COMMUNITY): Admitting: Registered Nurse

## 2023-10-12 ENCOUNTER — Inpatient Hospital Stay (HOSPITAL_COMMUNITY)
Admission: RE | Admit: 2023-10-12 | Discharge: 2023-10-19 | DRG: 268 | Disposition: A | Discharge status: Home Health | Attending: Vascular Surgery | Admitting: Vascular Surgery

## 2023-10-12 ENCOUNTER — Encounter (HOSPITAL_COMMUNITY): Payer: Self-pay | Admitting: Vascular Surgery

## 2023-10-12 DIAGNOSIS — R918 Other nonspecific abnormal finding of lung field: Secondary | ICD-10-CM | POA: Diagnosis not present

## 2023-10-12 DIAGNOSIS — R0902 Hypoxemia: Secondary | ICD-10-CM | POA: Diagnosis not present

## 2023-10-12 DIAGNOSIS — Z9109 Other allergy status, other than to drugs and biological substances: Secondary | ICD-10-CM

## 2023-10-12 DIAGNOSIS — J449 Chronic obstructive pulmonary disease, unspecified: Secondary | ICD-10-CM

## 2023-10-12 DIAGNOSIS — J439 Emphysema, unspecified: Secondary | ICD-10-CM | POA: Diagnosis present

## 2023-10-12 DIAGNOSIS — M810 Age-related osteoporosis without current pathological fracture: Secondary | ICD-10-CM | POA: Diagnosis present

## 2023-10-12 DIAGNOSIS — Z7982 Long term (current) use of aspirin: Secondary | ICD-10-CM

## 2023-10-12 DIAGNOSIS — I714 Abdominal aortic aneurysm, without rupture, unspecified: Secondary | ICD-10-CM

## 2023-10-12 DIAGNOSIS — Z01818 Encounter for other preprocedural examination: Principal | ICD-10-CM

## 2023-10-12 DIAGNOSIS — E785 Hyperlipidemia, unspecified: Secondary | ICD-10-CM | POA: Diagnosis present

## 2023-10-12 DIAGNOSIS — Z87891 Personal history of nicotine dependence: Secondary | ICD-10-CM | POA: Diagnosis not present

## 2023-10-12 DIAGNOSIS — Z8673 Personal history of transient ischemic attack (TIA), and cerebral infarction without residual deficits: Secondary | ICD-10-CM

## 2023-10-12 DIAGNOSIS — G2581 Restless legs syndrome: Secondary | ICD-10-CM | POA: Diagnosis present

## 2023-10-12 DIAGNOSIS — F0390 Unspecified dementia without behavioral disturbance: Secondary | ICD-10-CM | POA: Diagnosis present

## 2023-10-12 DIAGNOSIS — Z833 Family history of diabetes mellitus: Secondary | ICD-10-CM

## 2023-10-12 DIAGNOSIS — I7143 Infrarenal abdominal aortic aneurysm, without rupture: Principal | ICD-10-CM | POA: Diagnosis present

## 2023-10-12 DIAGNOSIS — E1151 Type 2 diabetes mellitus with diabetic peripheral angiopathy without gangrene: Secondary | ICD-10-CM | POA: Diagnosis present

## 2023-10-12 DIAGNOSIS — I10 Essential (primary) hypertension: Secondary | ICD-10-CM | POA: Diagnosis present

## 2023-10-12 DIAGNOSIS — J984 Other disorders of lung: Secondary | ICD-10-CM | POA: Diagnosis not present

## 2023-10-12 DIAGNOSIS — Z860100 Personal history of colon polyps, unspecified: Secondary | ICD-10-CM | POA: Diagnosis not present

## 2023-10-12 DIAGNOSIS — Z95828 Presence of other vascular implants and grafts: Secondary | ICD-10-CM

## 2023-10-12 DIAGNOSIS — I7409 Other arterial embolism and thrombosis of abdominal aorta: Secondary | ICD-10-CM | POA: Diagnosis present

## 2023-10-12 DIAGNOSIS — Z981 Arthrodesis status: Secondary | ICD-10-CM | POA: Diagnosis not present

## 2023-10-12 DIAGNOSIS — Z23 Encounter for immunization: Secondary | ICD-10-CM | POA: Diagnosis not present

## 2023-10-12 DIAGNOSIS — Z820 Family history of epilepsy and other diseases of the nervous system: Secondary | ICD-10-CM | POA: Diagnosis not present

## 2023-10-12 DIAGNOSIS — Z8701 Personal history of pneumonia (recurrent): Secondary | ICD-10-CM | POA: Diagnosis not present

## 2023-10-12 DIAGNOSIS — Z8679 Personal history of other diseases of the circulatory system: Secondary | ICD-10-CM | POA: Diagnosis not present

## 2023-10-12 DIAGNOSIS — Z7983 Long term (current) use of bisphosphonates: Secondary | ICD-10-CM

## 2023-10-12 DIAGNOSIS — I70203 Unspecified atherosclerosis of native arteries of extremities, bilateral legs: Secondary | ICD-10-CM

## 2023-10-12 DIAGNOSIS — D62 Acute posthemorrhagic anemia: Secondary | ICD-10-CM | POA: Diagnosis not present

## 2023-10-12 DIAGNOSIS — R0602 Shortness of breath: Secondary | ICD-10-CM | POA: Diagnosis not present

## 2023-10-12 DIAGNOSIS — Z79899 Other long term (current) drug therapy: Secondary | ICD-10-CM

## 2023-10-12 DIAGNOSIS — E119 Type 2 diabetes mellitus without complications: Secondary | ICD-10-CM | POA: Diagnosis not present

## 2023-10-12 DIAGNOSIS — E1140 Type 2 diabetes mellitus with diabetic neuropathy, unspecified: Secondary | ICD-10-CM | POA: Diagnosis present

## 2023-10-12 DIAGNOSIS — J810 Acute pulmonary edema: Secondary | ICD-10-CM | POA: Diagnosis not present

## 2023-10-12 DIAGNOSIS — K449 Diaphragmatic hernia without obstruction or gangrene: Secondary | ICD-10-CM | POA: Diagnosis present

## 2023-10-12 HISTORY — PX: ABDOMINAL AORTIC ENDOVASCULAR STENT GRAFT: SHX5707

## 2023-10-12 HISTORY — PX: ENDARTERECTOMY FEMORAL: SHX5804

## 2023-10-12 HISTORY — PX: ULTRASOUND GUIDANCE FOR VASCULAR ACCESS: SHX6516

## 2023-10-12 LAB — CBC
HCT: 25 % — ABNORMAL LOW (ref 36.0–46.0)
Hemoglobin: 8.1 g/dL — ABNORMAL LOW (ref 12.0–15.0)
MCH: 31.6 pg (ref 26.0–34.0)
MCHC: 32.4 g/dL (ref 30.0–36.0)
MCV: 97.7 fL (ref 80.0–100.0)
Platelets: 109 K/uL — ABNORMAL LOW (ref 150–400)
RBC: 2.56 MIL/uL — ABNORMAL LOW (ref 3.87–5.11)
RDW: 13.4 % (ref 11.5–15.5)
WBC: 8.3 K/uL (ref 4.0–10.5)
nRBC: 0 % (ref 0.0–0.2)

## 2023-10-12 LAB — CREATININE, SERUM
Creatinine, Ser: 0.92 mg/dL (ref 0.44–1.00)
GFR, Estimated: >60 mL/min (ref >60)

## 2023-10-12 LAB — GLUCOSE, CAPILLARY
Glucose-Capillary: 91 mg/dL (ref 70–99)
Glucose-Capillary: 144 mg/dL — ABNORMAL HIGH (ref 70–99)

## 2023-10-12 LAB — POCT ACTIVATED CLOTTING TIME
Activated Clotting Time: 239 s
Activated Clotting Time: 308 s

## 2023-10-12 SURGERY — INSERTION, ENDOVASCULAR STENT GRAFT, AORTA, ABDOMINAL
Anesthesia: General | Laterality: Right

## 2023-10-12 MED ORDER — HEPARIN 6000 UNIT IRRIGATION SOLUTION
Status: AC
  Filled 2023-10-12: qty 500

## 2023-10-12 MED ORDER — FENTANYL CITRATE (PF) 100 MCG/2ML IJ SOLN: 25.0000 ug | INTRAMUSCULAR | Status: DC | PRN

## 2023-10-12 MED ORDER — POLYETHYLENE GLYCOL 3350 17 G PO PACK
17.0000 g | PACK | Freq: Every day | ORAL | Status: DC | PRN
  Administered 2023-10-14 – 2023-10-18 (×2): 17 g via ORAL
  Filled 2023-10-12 (×2): qty 1

## 2023-10-12 MED ORDER — ASPIRIN 81 MG PO CHEW
81.0000 mg | CHEWABLE_TABLET | Freq: Every day | ORAL | Status: DC
  Administered 2023-10-13 – 2023-10-19 (×7): 81 mg via ORAL
  Filled 2023-10-12 (×7): qty 1

## 2023-10-12 MED ORDER — CEFAZOLIN SODIUM-DEXTROSE 2-4 GM/100ML-% IV SOLN
2.0000 g | Freq: Three times a day (TID) | INTRAVENOUS | Status: AC
  Administered 2023-10-12 – 2023-10-14 (×6): 2 g via INTRAVENOUS
  Filled 2023-10-12 (×6): qty 100

## 2023-10-12 MED ORDER — PHENYLEPHRINE HCL-NACL 20-0.9 MG/250ML-% IV SOLN
INTRAVENOUS | Status: DC | PRN
  Administered 2023-10-12: 50 ug/min via INTRAVENOUS

## 2023-10-12 MED ORDER — HYDROMORPHONE HCL 1 MG/ML IJ SOLN
0.5000 mg | INTRAMUSCULAR | Status: DC | PRN
  Filled 2023-10-12: qty 0.5

## 2023-10-12 MED ORDER — PROPOFOL 10 MG/ML IV BOLUS
INTRAVENOUS | Status: DC | PRN
  Administered 2023-10-12: 90 mg via INTRAVENOUS

## 2023-10-12 MED ORDER — PROPOFOL 10 MG/ML IV BOLUS
INTRAVENOUS | Status: AC
  Filled 2023-10-12: qty 20

## 2023-10-12 MED ORDER — DOCUSATE SODIUM 100 MG PO CAPS
100.0000 mg | ORAL_CAPSULE | Freq: Every day | ORAL | Status: DC
  Administered 2023-10-14 – 2023-10-19 (×6): 100 mg via ORAL
  Filled 2023-10-12 (×7): qty 1

## 2023-10-12 MED ORDER — INFLUENZA VAC SPLIT HIGH-DOSE 0.5 ML IM SUSY
0.5000 mL | PREFILLED_SYRINGE | INTRAMUSCULAR | Status: AC
  Administered 2023-10-14: 0.5 mL via INTRAMUSCULAR
  Filled 2023-10-12: qty 0.5

## 2023-10-12 MED ORDER — IODIXANOL 320 MG/ML IV SOLN
INTRAVENOUS | Status: DC | PRN
  Administered 2023-10-12: 60 mL via INTRA_ARTERIAL

## 2023-10-12 MED ORDER — FENTANYL CITRATE (PF) 250 MCG/5ML IJ SOLN
INTRAMUSCULAR | Status: AC
  Filled 2023-10-12: qty 5

## 2023-10-12 MED ORDER — CHLORHEXIDINE GLUCONATE CLOTH 2 % EX PADS: 6.0000 | MEDICATED_PAD | Freq: Once | CUTANEOUS | Status: DC

## 2023-10-12 MED ORDER — SUGAMMADEX SODIUM 200 MG/2ML IV SOLN
INTRAVENOUS | Status: DC | PRN
  Administered 2023-10-12: 114.4 mg via INTRAVENOUS

## 2023-10-12 MED ORDER — FLUTICASONE PROPIONATE 50 MCG/ACT NA SUSP
1.0000 | Freq: Every day | NASAL | Status: DC
  Administered 2023-10-14 – 2023-10-19 (×6): 1 via NASAL
  Filled 2023-10-12 (×2): qty 16

## 2023-10-12 MED ORDER — ESCITALOPRAM OXALATE 10 MG PO TABS
10.0000 mg | ORAL_TABLET | Freq: Every day | ORAL | Status: DC
  Administered 2023-10-13 – 2023-10-19 (×7): 10 mg via ORAL
  Filled 2023-10-12 (×7): qty 1

## 2023-10-12 MED ORDER — BUDESON-GLYCOPYRROL-FORMOTEROL 160-9-4.8 MCG/ACT IN AERO
2.0000 | INHALATION_SPRAY | Freq: Two times a day (BID) | RESPIRATORY_TRACT | Status: DC
  Administered 2023-10-13 – 2023-10-19 (×13): 2 via RESPIRATORY_TRACT
  Filled 2023-10-12: qty 5.9

## 2023-10-12 MED ORDER — OXYCODONE HCL 5 MG/5ML PO SOLN: 5.0000 mg | Freq: Once | ORAL | Status: DC | PRN

## 2023-10-12 MED ORDER — ACETAMINOPHEN 10 MG/ML IV SOLN: 1000.0000 mg | Freq: Once | INTRAVENOUS | Status: DC | PRN

## 2023-10-12 MED ORDER — HEPARIN SODIUM (PORCINE) 1000 UNIT/ML IJ SOLN
INTRAMUSCULAR | Status: DC | PRN
  Administered 2023-10-12: 5000 [IU] via INTRAVENOUS
  Administered 2023-10-12: 6000 [IU] via INTRAVENOUS

## 2023-10-12 MED ORDER — LIDOCAINE 2% (20 MG/ML) 5 ML SYRINGE
INTRAMUSCULAR | Status: DC | PRN
  Administered 2023-10-12: 100 mg via INTRAVENOUS

## 2023-10-12 MED ORDER — SODIUM CHLORIDE 0.9 % IV SOLN: INTRAVENOUS | Status: DC | PRN

## 2023-10-12 MED ORDER — ALBUTEROL SULFATE (2.5 MG/3ML) 0.083% IN NEBU: 3.0000 mL | INHALATION_SOLUTION | RESPIRATORY_TRACT | Status: DC | PRN

## 2023-10-12 MED ORDER — PHENYLEPHRINE 80 MCG/ML (10ML) SYRINGE FOR IV PUSH (FOR BLOOD PRESSURE SUPPORT)
PREFILLED_SYRINGE | INTRAVENOUS | Status: DC | PRN
  Administered 2023-10-12: 80 ug via INTRAVENOUS
  Administered 2023-10-12 (×2): 40 ug via INTRAVENOUS
  Administered 2023-10-12: 80 ug via INTRAVENOUS
  Administered 2023-10-12: 40 ug via INTRAVENOUS

## 2023-10-12 MED ORDER — MAGNESIUM OXIDE -MG SUPPLEMENT 400 (240 MG) MG PO TABS
400.0000 mg | ORAL_TABLET | Freq: Every day | ORAL | Status: DC
  Administered 2023-10-12 – 2023-10-19 (×8): 400 mg via ORAL
  Filled 2023-10-12 (×8): qty 1

## 2023-10-12 MED ORDER — ONDANSETRON HCL 4 MG/2ML IJ SOLN
INTRAMUSCULAR | Status: DC | PRN
  Administered 2023-10-12: 4 mg via INTRAVENOUS

## 2023-10-12 MED ORDER — PHENOL 1.4 % MT LIQD: 1.0000 | OROMUCOSAL | Status: DC | PRN

## 2023-10-12 MED ORDER — POTASSIUM CHLORIDE CRYS ER 20 MEQ PO TBCR: 40.0000 meq | EXTENDED_RELEASE_TABLET | Freq: Every day | ORAL | Status: DC | PRN

## 2023-10-12 MED ORDER — ACETAMINOPHEN 10 MG/ML IV SOLN
INTRAVENOUS | Status: DC | PRN
  Administered 2023-10-12: 1000 mg via INTRAVENOUS

## 2023-10-12 MED ORDER — BISACODYL 10 MG RE SUPP: 10.0000 mg | Freq: Every day | RECTAL | Status: DC | PRN

## 2023-10-12 MED ORDER — METOPROLOL TARTRATE 5 MG/5ML IV SOLN: 2.5000 mg | INTRAVENOUS | Status: DC | PRN

## 2023-10-12 MED ORDER — ACETAMINOPHEN 325 MG PO TABS
325.0000 mg | ORAL_TABLET | ORAL | Status: DC | PRN
  Administered 2023-10-13: 650 mg via ORAL
  Filled 2023-10-12: qty 2

## 2023-10-12 MED ORDER — GABAPENTIN 300 MG PO CAPS
300.0000 mg | ORAL_CAPSULE | Freq: Every day | ORAL | Status: DC
  Administered 2023-10-12 – 2023-10-18 (×7): 300 mg via ORAL
  Filled 2023-10-12 (×7): qty 1

## 2023-10-12 MED ORDER — ALBUMIN HUMAN 5 % IV SOLN
INTRAVENOUS | Status: AC
  Filled 2023-10-12: qty 250

## 2023-10-12 MED ORDER — ONDANSETRON HCL 4 MG/2ML IJ SOLN: 4.0000 mg | Freq: Once | INTRAMUSCULAR | Status: DC | PRN

## 2023-10-12 MED ORDER — ALBUMIN HUMAN 5 % IV SOLN
12.5000 g | Freq: Once | INTRAVENOUS | Status: AC
  Administered 2023-10-12: 12.5 g via INTRAVENOUS

## 2023-10-12 MED ORDER — DEXAMETHASONE SODIUM PHOSPHATE 10 MG/ML IJ SOLN
INTRAMUSCULAR | Status: DC | PRN
  Administered 2023-10-12: 5 mg via INTRAVENOUS

## 2023-10-12 MED ORDER — ACETAMINOPHEN 650 MG RE SUPP: 325.0000 mg | RECTAL | Status: DC | PRN

## 2023-10-12 MED ORDER — LACTATED RINGERS IV SOLN: INTRAVENOUS | Status: DC

## 2023-10-12 MED ORDER — FENTANYL CITRATE (PF) 250 MCG/5ML IJ SOLN
INTRAMUSCULAR | Status: DC | PRN
  Administered 2023-10-12: 100 ug via INTRAVENOUS

## 2023-10-12 MED ORDER — PRAVASTATIN SODIUM 40 MG PO TABS
20.0000 mg | ORAL_TABLET | Freq: Every day | ORAL | Status: DC
  Administered 2023-10-12 – 2023-10-18 (×7): 20 mg via ORAL
  Filled 2023-10-12 (×7): qty 1

## 2023-10-12 MED ORDER — OXYCODONE HCL 5 MG PO TABS: 5.0000 mg | ORAL_TABLET | Freq: Once | ORAL | Status: DC | PRN

## 2023-10-12 MED ORDER — ROCURONIUM BROMIDE 10 MG/ML (PF) SYRINGE
PREFILLED_SYRINGE | INTRAVENOUS | Status: DC | PRN
  Administered 2023-10-12: 60 mg via INTRAVENOUS

## 2023-10-12 MED ORDER — ACETAMINOPHEN 10 MG/ML IV SOLN
INTRAVENOUS | Status: AC
  Filled 2023-10-12: qty 100

## 2023-10-12 MED ORDER — CEFAZOLIN SODIUM-DEXTROSE 2-4 GM/100ML-% IV SOLN
2.0000 g | INTRAVENOUS | Status: AC
Start: 2023-10-12 — End: 2023-10-12
  Administered 2023-10-12: 2 g via INTRAVENOUS
  Filled 2023-10-12: qty 100

## 2023-10-12 MED ORDER — OXYCODONE-ACETAMINOPHEN 5-325 MG PO TABS
1.0000 | ORAL_TABLET | ORAL | Status: DC | PRN
  Administered 2023-10-12 – 2023-10-18 (×11): 1 via ORAL
  Filled 2023-10-12 (×11): qty 1

## 2023-10-12 MED ORDER — BUSPIRONE HCL 5 MG PO TABS
7.5000 mg | ORAL_TABLET | Freq: Three times a day (TID) | ORAL | Status: DC
Start: 2023-10-12 — End: 2023-10-19
  Administered 2023-10-12 – 2023-10-19 (×21): 7.5 mg via ORAL
  Filled 2023-10-12 (×21): qty 2

## 2023-10-12 MED ORDER — PANTOPRAZOLE SODIUM 40 MG PO TBEC
40.0000 mg | DELAYED_RELEASE_TABLET | Freq: Every day | ORAL | Status: DC
  Administered 2023-10-12 – 2023-10-18 (×7): 40 mg via ORAL
  Filled 2023-10-12 (×8): qty 1

## 2023-10-12 MED ORDER — HEMOSTATIC AGENTS (NO CHARGE) OPTIME
TOPICAL | Status: DC | PRN
  Administered 2023-10-12: 1 via TOPICAL

## 2023-10-12 MED ORDER — VITAMIN D 25 MCG (1000 UNIT) PO TABS
2000.0000 [IU] | ORAL_TABLET | Freq: Every day | ORAL | Status: DC
  Administered 2023-10-12 – 2023-10-19 (×8): 2000 [IU] via ORAL
  Filled 2023-10-12 (×8): qty 2

## 2023-10-12 MED ORDER — SODIUM CHLORIDE 0.9 % IV SOLN: INTRAVENOUS | Status: DC

## 2023-10-12 MED ORDER — LORATADINE 10 MG PO TABS
10.0000 mg | ORAL_TABLET | Freq: Every day | ORAL | Status: DC
  Administered 2023-10-13 – 2023-10-19 (×6): 10 mg via ORAL
  Filled 2023-10-12 (×7): qty 1

## 2023-10-12 MED ORDER — ALBUMIN HUMAN 5 % IV SOLN: INTRAVENOUS | Status: DC | PRN

## 2023-10-12 MED ORDER — EPHEDRINE SULFATE-NACL 50-0.9 MG/10ML-% IV SOSY
PREFILLED_SYRINGE | INTRAVENOUS | Status: DC | PRN
  Administered 2023-10-12 (×2): 5 mg via INTRAVENOUS
  Administered 2023-10-12: 2.5 mg via INTRAVENOUS

## 2023-10-12 MED ORDER — ORAL CARE MOUTH RINSE: 15.0000 mL | Freq: Once | OROMUCOSAL | Status: AC

## 2023-10-12 MED ORDER — CHLORHEXIDINE GLUCONATE 0.12 % MT SOLN
15.0000 mL | Freq: Once | OROMUCOSAL | Status: AC
  Administered 2023-10-12: 15 mL via OROMUCOSAL
  Filled 2023-10-12: qty 15

## 2023-10-12 MED ORDER — PROTAMINE SULFATE 10 MG/ML IV SOLN
INTRAVENOUS | Status: DC | PRN
  Administered 2023-10-12: 10 mg via INTRAVENOUS
  Administered 2023-10-12: 40 mg via INTRAVENOUS

## 2023-10-12 MED ORDER — HEPARIN 6000 UNIT IRRIGATION SOLUTION
Status: DC | PRN
  Administered 2023-10-12: 1

## 2023-10-12 MED ORDER — SODIUM CHLORIDE 0.9 % IV SOLN
INTRAVENOUS | Status: AC
Start: 2023-10-12 — End: 2023-10-13

## 2023-10-12 MED ORDER — HEPARIN SODIUM (PORCINE) 5000 UNIT/ML IJ SOLN
5000.0000 [IU] | Freq: Three times a day (TID) | INTRAMUSCULAR | Status: DC
  Administered 2023-10-13 – 2023-10-19 (×20): 5000 [IU] via SUBCUTANEOUS
  Filled 2023-10-12 (×20): qty 1

## 2023-10-12 SURGICAL SUPPLY — 60 items
BAG COUNTER SPONGE SURGICOUNT (BAG) ×3 IMPLANT
BALLOON MUSTANG 12X20X75 (BALLOONS) IMPLANT
BALLOON VASC XXL 7F 14X4X120 (BALLOONS) IMPLANT
BLADE CLIPPER SURG (BLADE) ×3 IMPLANT
CANISTER SUCTION 3000ML PPV (SUCTIONS) ×3 IMPLANT
CATH BEACON 5.038 65CM KMP-01 (CATHETERS) ×3 IMPLANT
CATH OMNI FLUSH .035X70CM (CATHETERS) ×3 IMPLANT
CLIP LIGATING EXTRA MED SLVR (CLIP) IMPLANT
CLIP LIGATING EXTRA SM BLUE (MISCELLANEOUS) IMPLANT
DERMABOND ADVANCED .7 DNX12 (GAUZE/BANDAGES/DRESSINGS) ×3 IMPLANT
DERMABOND ADVANCED .7 DNX6 (GAUZE/BANDAGES/DRESSINGS) IMPLANT
DEVICE CLOSURE PERCLS PRGLD 6F (VASCULAR PRODUCTS) ×12 IMPLANT
DRSG COVADERM 4X6 (GAUZE/BANDAGES/DRESSINGS) IMPLANT
DRSG TEGADERM 2-3/8X2-3/4 SM (GAUZE/BANDAGES/DRESSINGS) ×6 IMPLANT
ELECTRODE REM PT RTRN 9FT ADLT (ELECTROSURGICAL) ×6 IMPLANT
EXCLUDER TNK END 23X14.5X12 15 (Endovascular Graft) IMPLANT
GAUZE 4X4 16PLY ~~LOC~~+RFID DBL (SPONGE) ×3 IMPLANT
GAUZE SPONGE 2X2 8PLY STRL LF (GAUZE/BANDAGES/DRESSINGS) ×6 IMPLANT
GLIDEWIRE ADV .035X180CM (WIRE) ×3 IMPLANT
GLOVE BIO SURGEON STRL SZ7.5 (GLOVE) ×3 IMPLANT
GOWN STRL REUS W/ TWL LRG LVL3 (GOWN DISPOSABLE) ×6 IMPLANT
GOWN STRL REUS W/ TWL XL LVL3 (GOWN DISPOSABLE) ×6 IMPLANT
GRAFT BALLN CATH 65CM (BALLOONS) ×3 IMPLANT
KIT BASIN OR (CUSTOM PROCEDURE TRAY) ×3 IMPLANT
KIT DRAIN CSF ACCUDRAIN (MISCELLANEOUS) IMPLANT
KIT ENCORE 26 ADVANTAGE (KITS) IMPLANT
KIT TURNOVER KIT B (KITS) ×3 IMPLANT
LOOP VESSEL MAXI BLUE 1X16 (MISCELLANEOUS) IMPLANT
LOOP VESSEL MINI RED (MISCELLANEOUS) IMPLANT
NS IRRIG 1000ML POUR BTL (IV SOLUTION) ×3 IMPLANT
PACK ENDOVASCULAR (PACKS) ×3 IMPLANT
PAD ARMBOARD POSITIONER FOAM (MISCELLANEOUS) ×6 IMPLANT
PENCIL BUTTON HOLSTER BLD 10FT (ELECTRODE) IMPLANT
POWDER SURGICEL 3.0 GRAM (HEMOSTASIS) IMPLANT
SET MICROPUNCTURE 5F STIFF (MISCELLANEOUS) ×3 IMPLANT
SHEATH BRITE TIP 8FR 23CM (SHEATH) ×3 IMPLANT
SHEATH DRYSEAL FLEX 15FR 33CM (SHEATH) IMPLANT
SHEATH PINNACLE 8F 10CM (SHEATH) ×3 IMPLANT
SPONGE T-LAP 18X18 ~~LOC~~+RFID (SPONGE) ×6 IMPLANT
STAPLER SKIN PROX 35W (STAPLE) IMPLANT
STENT VIABAHN 11X59X135 (Permanent Stent) IMPLANT
STENT VIABAHN 11X79X135 (Permanent Stent) IMPLANT
STENT VIABAHN 8X79 8FR 135 (Permanent Stent) IMPLANT
STOPCOCK MORSE 400PSI 3WAY (MISCELLANEOUS) ×3 IMPLANT
SUT MNCRL AB 4-0 PS2 18 (SUTURE) ×6 IMPLANT
SUT PROLENE 5 0 C 1 24 (SUTURE) IMPLANT
SUT PROLENE 6 0 BV (SUTURE) IMPLANT
SUT SILK 2-0 18XBRD TIE 12 (SUTURE) IMPLANT
SUT SILK 3-0 18XBRD TIE 12 (SUTURE) IMPLANT
SUT SILK 4-0 18XBRD TIE 12 (SUTURE) IMPLANT
SUT VIC AB 2-0 CT1 TAPERPNT 27 (SUTURE) IMPLANT
SUT VIC AB 3-0 SH 27X BRD (SUTURE) IMPLANT
SYR 20ML LL LF (SYRINGE) ×3 IMPLANT
TOWEL GREEN STERILE (TOWEL DISPOSABLE) ×3 IMPLANT
TRAY FOLEY MTR SLVR 14FR STAT (SET/KITS/TRAYS/PACK) IMPLANT
TRAY FOLEY MTR SLVR 16FR STAT (SET/KITS/TRAYS/PACK) ×3 IMPLANT
TUBING INJECTOR 48 (MISCELLANEOUS) ×3 IMPLANT
WIRE AMPLATZ SS-J .035X180CM (WIRE) ×3 IMPLANT
WIRE AMPLATZ SSTIFF .035X260CM (WIRE) IMPLANT
WIRE BENTSON .035X145CM (WIRE) ×6 IMPLANT

## 2023-10-12 NOTE — Op Note (Signed)
 Patient name: Kylie Hurst MRN: [id number] DOB: July 18, 1945 Sex: female  10/12/2023 Pre-operative Diagnosis: abdominal aortic aneurysm Post-operative diagnosis:  Same Surgeon:  Penne C. Sheree, MD Assistant: Lucie Apt, PA Procedure Performed: 1.  Percutaneous bilateral common femoral access and Pro-glide device closure 2.  Endovascular aortic aneurysm repair with main body right 23 x 14 x 12 cm device extended with 11 x 79 mm VBX and contralateral limb left proximal 11 x 59 mm VBX extended with 8L x 79 mm VBX 3.  Right common femoral endarterectomy with vein patch angioplasty via harvested anterior accessory saphenous vein   Indications: 78 year old female with history of abdominal aortic aneurysm that is reached 5 cm.  We have discussed options for continued watchful waiting versus endovascular repair and she has elected for endovascular repair.  We discussed that she has significant iliac and common femoral occlusive disease will possibly require cutdown repair of the common femoral arteries.  Findings: The left common femoral artery was calcified we were able to cannulate above this and we placed an 8 French sheath on the left and the left side was percutaneously closed and at completion there was a strong dorsalis pedis on the left.  On the right side there was heavy calcification we were unable to place percutaneous closure devices and ultimately required cutdown with extensive endarterectomy and vein patch angioplasty and at completion there was strong dorsalis pedis signal.  Aortic aneurysm itself had a 3 cm neck and we achieved a seal proximally.  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  I completion there was proximal and distal seal with no evidence of endoleak and both hypogastric and both renal arteries filled.   Procedure:  The patient was identified in the holding area and taken to the operating room where she  was placed upon operative table and general anesthesia was induced.  She was sterilely prepped and draped in the usual fashion, antibiotics were administered and a timeout was called.  We began using ultrasound to identify the left common femoral artery which was noted to be heavily calcified.  Cephalad to the calcium  might did identify 1 soft spot and cannulated this with a micropuncture needle followed by wire sheath.  A Bentson wire was placed followed by an 8 [place] dilator followed by 1 Pro-glide device.  We then placed a 8 French sheath from the left side.  On the right side the artery was heavily diseased.  I attempted to cannulate the distal external iliac artery at the confluence of the common femoral artery with a micropuncture needle followed by wire and sheath.  We then placed a Bentson wire under fluoroscopic guidance.  I attempted to place an 8 [place] dilator but could not get this to pass of we placed the micropuncture sheath and then placed a stiff wire followed by an 8 [place] sheath.  I performed angiography which demonstrated severe disease with approximately 90% stenosis of the proximal common femoral artery.  With this we elected that we would not place closure devices after 2 closure devices failed.  I then placed an Amplatz wire placed a 15 French sheath from the right up to the level of L2 and the patient was fully heparinized.  We then brought the main body device from the right side with an orientation of cross limbs.  From the left side we performed aortogram.  The device was then deployed at the level of the renal  arteries we constrained moved cephalad and redeployed.  Completion angiography demonstrated that we are just at the level of the right renal artery which was the lowest and both renal arteries did fill briskly.  From the left side we then cannulated the gate using a Glidewire advantage and Kumpe catheter and we were able to tour a lay Omni catheter within the device.  We perform  retrograde angiography and then we extended with VBX stents.  We actually deployed the distal stent first which was an 8L x 79 mm VBX down to the level of the hypogastric artery and extended cephalad with 11 x 59 mm VBX.  This was postdilated in the gate with 14 balloon.  Distally we ultimately dilated the 8 mm stent with a 12 balloon to get seal in the common iliac artery.  From the right side we then perform retrograde angiography and extended on the right with 11 x 79 mm VBX down to the level of the hypogastric artery.  We then inflated a 14 mm balloon and that VBX within the overlap segment and while this was inflated we also inflated the 11 mm balloon from the left side which were both inflated at the aortic bifurcation which was noted to have occlusive disease.  Completion demonstrated brisk flow through both limbs there were no endoleak's both renal arteries filled both hypogastric arteries filled.  We perform retrograde angiography from the left side which demonstrated a common femoral artery to be patent.  We then placed a Bentson wire and deployed the Pro-glide closure device.  I did perform retrograde angiography via micropuncture sheath again demonstrated patency and the Pro-glide was completed and there was a strong Doppler signal on the left.  On the right side we elected for cutdown.  We made a vertical incision in the groin cutdown to the sheath.  As we were obtaining proximal control around the external leg artery the sheath ultimately dislodged which caused significant blood onto my face as the primary surgeon.  Some of this likely dripped into the field and additional antibiotics will be given and also we sent the patient's labs and the family will be notified.  We were ultimately able to get control proximally with a Henley clamp.  We then opened distally and identified the profunda and a soft area on the proximal SFA and these were clamped while pressure was being held we did not lose any  additional blood loss at that time.  Through the same wound identified an anterior excessively saphenous vein which was overlying the SFA.  This was harvested for approximately 8 cm clamped proximally distally and tied off.  This was then spatulated.  Additional 5000 units of heparin  was administered.  We open the SFA all the way up to the arteriotomy and the external neck artery and even above this.  Extensive endarterectomy was performed.  The vein was then sewn in place as a patch angioplasty with 5-0 Prolene suture.  Prior completion without flushing all directions.  Upon completion there was very strong pulsatility in the common femoral artery and proximal SFA.  There was strong Doppler signals at the foot.  50 mg of protamine  was administered.  We obtained hemostasis diligently in the wound and then closed in layers with 2-0 Vicryl deep sutures and interrupted 3-0 Vicryl superficial sutures.  Staples were placed at the skin level.  The patient was then awakened from anesthesia having tolerated the procedure without any complication.  All counts were correct at completion.  EBL: 500 cc  Contrast: 60cc   Genny Caulder C. Sheree, MD [facility] Office: [phone] Pager: [phone]

## 2023-10-12 NOTE — H&P (Signed)
 HPI:   Kylie Hurst is a 78 y.o. female with known history of abdominal aortic aneurysm now following up with CT scan.  Her father possibly had an aneurysm but currently unknown.  She denies any new back or abdominal pain.  She does walk although has limitations due to leg pain.  She does have hyperlipidemia, hypertension and is a former smoker.  She does not take any blood thinners she is on aspirin .  Her niece is present with her today states that she has had some increased difficulty breathing with short distance activity currently on inhalers which has been improving.       Past Medical History:  Diagnosis Date   Aortic aneurysm (HCC) 2011    3.83mm   Arthritis     Back spasm     Bulging discs     Chronic back pain     Dementia (HCC)     Depression     Diabetes mellitus without complication (HCC)      fasting 90-110   Emphysema     Environmental and seasonal allergies     Hyperlipidemia     Hypertension     Neuromuscular disorder (HCC)     Onycholysis of toenail     Osteoporosis     Ovarian cyst 06/01/2009    5 cm   Restless legs     Spigelian hernia      left ventral   Wears glasses               Family History  Problem Relation Age of Onset   Diabetes Mother     Cancer Sister     Epilepsy Brother               Past Surgical History:  Procedure Laterality Date   ANTERIOR CERVICAL DECOMP/DISCECTOMY FUSION N/A 12/18/2012    Procedure: ANTERIOR CERVICAL DECOMPRESSION/DISCECTOMY FUSION 1 LEVEL;  Surgeon: Oneil Rodgers Priestly, MD;  Location: [facility] OR;  Service: Orthopedics;  Laterality: N/A;  Anterior cervical decompression fusion, cervical 7 - thoracic 1 with instrumentation, allograft   COLONOSCOPY       INSERTION OF MESH Left 05/14/2014    Procedure: INSERTION OF MESH;  Surgeon: Donnice Lima, MD;  Location: [facility] OR;  Service: General;  Laterality: Left;   NECK SURGERY       SPIGELIAN HERNIA   05/14/2014   VENTRAL HERNIA REPAIR Left 05/14/2014    Procedure:  ATTEMPTED LAPAROSCOPIC REPAIR LEFT SPIGLIAN HERINA CONVERTED TO OPEN REPAIR;  Surgeon: Donnice Lima, MD;  Location: [facility] OR;  Service: General;  Laterality: Left;          Short Social History:  Social History         Tobacco Use   Smoking status: Former      Current packs/day: 1.00      Average packs/day: 1 pack/day for 52.0 years (52.0 ttl pk-yrs)      Types: Cigarettes   Smokeless tobacco: Never   Tobacco comments:      Currently on Chantix   Substance Use Topics   Alcohol use: No      Allergies       Allergies  Allergen Reactions   Other Itching and Other (See Comments)      Seasonal Allergies- Itchy eyes, runny nose, etc..              Current Outpatient Medications  Medication Sig Dispense Refill   ACCU-CHEK AVIVA PLUS test strip USE AS DIRECTED TO TEST  BLOOD SUGAR DAILY       albuterol  (VENTOLIN  HFA) 108 (90 Base) MCG/ACT inhaler Inhale 1 puff into the lungs every 4 (four) hours as needed for shortness of breath or wheezing.       ANORO ELLIPTA  62.5-25 MCG/INH AEPB Inhale 1 puff into the lungs in the morning.       aspirin  (ASPIRIN  CHILDRENS) 81 MG chewable tablet Chew 1 tablet (81 mg total) by mouth daily. 30 tablet 0   b complex vitamins tablet Take 1 tablet by mouth daily.       busPIRone  (BUSPAR ) 7.5 MG tablet Take 7.5 mg by mouth 2 (two) times daily.       cholecalciferol  (VITAMIN D3) 25 MCG (1000 UNIT) tablet Take 2,000 Units by mouth daily.       denosumab (PROLIA) 60 MG/ML SOSY injection Inject 60 mg into the skin every 6 (six) months.       escitalopram  (LEXAPRO ) 10 MG tablet Take 10 mg by mouth daily.       gabapentin  (NEURONTIN ) 300 MG capsule Take 300 mg by mouth at bedtime.       hydrochlorothiazide  (HYDRODIURIL ) 12.5 MG tablet Take 12.5 mg by mouth daily.       KLOR-CON  M20 20 MEQ tablet Take 20 mEq by mouth every evening.       lisinopril  (PRINIVIL ,ZESTRIL ) 5 MG tablet Take 5 mg by mouth at bedtime.        polyethylene glycol-electrolytes (NULYTELY )  420 g solution See admin instructions.       pravastatin  (PRAVACHOL ) 20 MG tablet Take 20 mg by mouth at bedtime.        SHINGRIX injection         sodium chloride  (OCEAN) 0.65 % SOLN nasal spray Place 2 sprays into both nostrils at bedtime.       tetrahydrozoline 0.05 % ophthalmic solution Place 1 drop into both eyes 3 (three) times daily.          No current facility-administered medications for this visit.        Review of Systems  Constitutional:  Constitutional negative. HENT: HENT negative.  Eyes: Eyes negative.  Respiratory: Respiratory negative.  Cardiovascular: Cardiovascular negative.  GI: Gastrointestinal negative.  Musculoskeletal: Musculoskeletal negative.  Skin: Skin negative.  Neurological: Neurological negative. Hematologic: Hematologic/lymphatic negative.  Psychiatric: Psychiatric negative.          Objective:    Vitals:   10/12/23 0819  BP: 131/67  Pulse: 68  Resp: 17  Temp: 98.1 F (36.7 C)  SpO2: 94%    Physical Exam HENT:     Head: Normocephalic.    Eyes:     Pupils: Pupils are equal, round, and reactive to light.      Cardiovascular:     Rate and Rhythm: Normal rate.     Pulses:         1+ palpable dorsalis pedis pulses bilaterally      Abdominal:     General: Abdomen is flat.     Palpations: Abdomen is soft.    Musculoskeletal:        General: Normal range of motion.     Right lower leg: No edema.     Left lower leg: No edema.    Skin:    General: Skin is warm.     Capillary Refill: Capillary refill takes less than 2 seconds.    Neurological:     General: No focal deficit present.     Mental Status: She is  alert.    Psychiatric:        Mood and Affect: Mood normal.        Behavior: Behavior normal.        Thought Content: Thought content normal.        Judgment: Judgment normal.        Data: CTA IMPRESSION: VASCULAR   1. Abdominal aortic aneurysm measuring 5 cm, not appreciably changed by my measurement since  previous study. Recommend CTA or MRA, as appropriate, in 6 months and referral to a vascular specialist. Reference: Journal of Vascular Surgery 67.1 (2018): 2-77. J Am Coll Radiol 2013;10:789-794. 2.  Aortic Atherosclerosis (ICD10-I70.0).   NON-VASCULAR   1. Cholelithiasis without acute cholecystitis. 2. Distal colonic diverticulosis without diverticulitis. 3. Right lower quadrant spigelian hernia containing a small portion of the cecum. No incarceration or obstruction. 4. Small hiatal hernia.      Assessment/Plan:    78 year old female with history of abdominal aortic aneurysm now 5.1 cm.  We have reviewed the CT scan and discussed options and she wishes to proceed with endovascular repair.  We discussed the risk benefits alternatives as well as possible need for future procedures and she demonstrates good understanding.  We again discussed expected hospital course and possible need for cutdown bilateral common femoral arteries due to underlying disease.  All questions were answered and consent was signed.     Abilene Mcphee C. Sheree, MD [facility] [place] Office: [phone] Pager: [phone]

## 2023-10-12 NOTE — Anesthesia Procedure Notes (Signed)
 Arterial Line Insertion Start/End9/12/2023 8:55 AM, 10/12/2023 9:00 AM Performed by: Zelphia Norleen HERO, CRNA, CRNA  Patient location: Pre-op. Preanesthetic checklist: patient identified, IV checked, site marked, risks and benefits discussed, surgical consent, monitors and equipment checked, pre-op evaluation, timeout performed and anesthesia consent Lidocaine  1% used for infiltration Left, radial was placed Catheter size: 20 G Hand hygiene performed  and maximum sterile barriers used   Attempts: 1 Procedure performed without using ultrasound guided technique. Following insertion, dressing applied and Biopatch. Post procedure assessment: normal and unchanged  Patient tolerated the procedure well with no immediate complications.

## 2023-10-12 NOTE — Plan of Care (Signed)

## 2023-10-12 NOTE — Anesthesia Procedure Notes (Signed)
 Procedure Name: Intubation Date/Time: 10/12/2023 10:04 AM  Performed by: Virgil Ee, CRNAPre-anesthesia Checklist: Patient identified, Patient being monitored, Timeout performed, Emergency Drugs available and Suction available Patient Re-evaluated:Patient Re-evaluated prior to induction Oxygen Delivery Method: Circle system utilized Preoxygenation: Pre-oxygenation with 100% oxygen Induction Type: IV induction Ventilation: Mask ventilation without difficulty Laryngoscope Size: Mac and 3 Grade View: Grade I Tube type: Oral Tube size: 6.5 mm Number of attempts: 1 Airway Equipment and Method: Stylet Placement Confirmation: ETT inserted through vocal cords under direct vision, positive ETCO2 and breath sounds checked- equal and bilateral Secured at: 21 cm Tube secured with: Tape Dental Injury: Teeth and Oropharynx as per pre-operative assessment

## 2023-10-12 NOTE — Discharge Instructions (Signed)
°[facility]  ° °Discharge Instructions ° °Endovascular Aortic Aneurysm Repair ° °Please refer to the following instructions for your post-procedure care. Your surgeon or Physician Assistant will discuss any changes with you. ° °Activity ° °You are encouraged to walk as much as you can. You can slowly return to normal activities but must avoid strenuous activity and heavy lifting until your doctor tells you it's OK. Avoid activities such as vacuuming or swinging a gold club. It is normal to feel tired for several weeks after your surgery. Do not drive until your doctor gives the OK and you are no longer taking prescription pain medications. It is also normal to have difficulty with sleep habits, eating, and bowel movements after surgery. These will go away with time. ° °Bathing/Showering ° °Shower daily after you go home.  Do not soak in a bathtub, hot tub, or swim until the incision heals completely. ° °If you have incisions in your groin, wash the groin wounds with soap and water daily and pat dry. (No tub bath-only shower)  Then put a dry gauze or washcloth there to keep this area dry to help prevent wound infection daily and as needed.  Do not use Vaseline or neosporin on your incisions.  Only use soap and water on your incisions and then protect and keep dry. ° °Incision Care ° °Shower every day. Clean your incision with mild soap and water. Pat the area dry with a clean towel. You do not need a bandage unless otherwise instructed. Do not apply any ointments or creams to your incision. If you clothing is irritating, you may cover your incision with a dry gauze pad. ° °Wash the groin wound with soap and water daily and pat dry. (No tub bath-only shower)  Then put a dry gauze or washcloth there to keep this area dry daily and as needed.  Do not use Vaseline or neosporin on your incisions.  Only use soap and water on your incisions and then protect and keep dry. ° ° °Diet ° °Resume  your normal diet. There are no special food restrictions following this procedure. A low fat/low cholesterol diet is recommended for all patients with vascular disease. In order to heal from your surgery, it is CRITICAL to get adequate nutrition. Your body requires vitamins, minerals, and protein. Vegetables are the best source of vitamins and minerals. Vegetables also provide the perfect balance of protein. Processed food has little nutritional value, so try to avoid this. ° °Medications ° °Resume taking all of your medications unless your doctor or nurse practitioner tells you not to. If your incision is causing pain, you may take over-the-counter pain relievers such as acetaminophen (Tylenol). If you were prescribed a stronger pain medication, please be aware these medications can cause nausea and constipation. Prevent nausea by taking the medication with a snack or meal. Avoid constipation by drinking plenty of fluids and eating foods with a high amount of fiber, such as fruits, vegetables, and grains.  °Do not take Tylenol if you are taking prescription pain medications. ° ° °Follow up ° °Our office will schedule a follow-up appointment with a CT scan 3-4 weeks after your surgery. ° °Please call us immediately for any of the following conditions ° °• Severe or worsening pain in your legs or feet or in your abdomen back or chest. °• Increased pain, redness, drainage (pus) from your incision site. °• Increased abdominal pain, bloating, nausea, vomiting or persistent diarrhea. °• Fever of 101   degrees or higher. °• Swelling in your leg (s), °•  °Reduce your risk of vascular disease ° °•Stop smoking. If you would like help call [facility] at 1-800-QUIT-NOW (1-800-784-8669) or [place] at 336-586-4000. °•Manage your cholesterol °•Maintain a desired weight °•Control your diabetes °•Keep your blood pressure down ° °If you have questions, please call the office at [phone]. ° °

## 2023-10-12 NOTE — Transfer of Care (Signed)
 Immediate Anesthesia Transfer of Care Note  Patient: Kylie Hurst  Procedure(s) Performed: INSERTION, ENDOVASCULAR STENT GRAFT, AORTA, ABDOMINAL ENDARTERECTOMY, FEMORAL WITH VEIN PATCH ANGIOPLASTY (Right) ULTRASOUND GUIDANCE, FOR VASCULAR ACCESS (Bilateral)  Patient Location: PACU  Anesthesia Type:General  Level of Consciousness: drowsy and responds to stimulation  Airway & Oxygen Therapy: Patient Spontanous Breathing and Patient connected to face mask oxygen  Post-op Assessment: Report given to RN and Post -op Vital signs reviewed and stable  Post vital signs: Reviewed and stable  Last Vitals:  Vitals Value Taken Time  BP 109/74 10/12/23 13:30  Temp    Pulse 71 10/12/23 13:33  Resp 14 10/12/23 13:33  SpO2 98 % 10/12/23 13:33  Vitals shown include unfiled device data.  Last Pain:  Vitals:   10/12/23 0851  TempSrc:   PainSc: 0-No pain         Complications: No notable events documented.

## 2023-10-13 DIAGNOSIS — Z95828 Presence of other vascular implants and grafts: Secondary | ICD-10-CM

## 2023-10-13 DIAGNOSIS — Z9889 Other specified postprocedural states: Secondary | ICD-10-CM

## 2023-10-13 LAB — BASIC METABOLIC PANEL WITH GFR
Anion gap: 10 (ref 5–15)
BUN: 17 mg/dL (ref 8–23)
CO2: 23 mmol/L (ref 22–32)
Calcium: 8.5 mg/dL — ABNORMAL LOW (ref 8.9–10.3)
Chloride: 106 mmol/L (ref 98–111)
Creatinine, Ser: 0.97 mg/dL (ref 0.44–1.00)
GFR, Estimated: >60 mL/min (ref >60)
Glucose, Bld: 136 mg/dL — ABNORMAL HIGH (ref 70–99)
Potassium: 4.3 mmol/L (ref 3.5–5.1)
Sodium: 139 mmol/L (ref 135–145)

## 2023-10-13 LAB — CBC
HCT: 22.3 % — ABNORMAL LOW (ref 36.0–46.0)
Hemoglobin: 7.3 g/dL — ABNORMAL LOW (ref 12.0–15.0)
MCH: 31.6 pg (ref 26.0–34.0)
MCHC: 32.7 g/dL (ref 30.0–36.0)
MCV: 96.5 fL (ref 80.0–100.0)
Platelets: 119 K/uL — ABNORMAL LOW (ref 150–400)
RBC: 2.31 MIL/uL — ABNORMAL LOW (ref 3.87–5.11)
RDW: 13.6 % (ref 11.5–15.5)
WBC: 8.7 K/uL (ref 4.0–10.5)
nRBC: 0 % (ref 0.0–0.2)

## 2023-10-13 LAB — PREPARE RBC (CROSSMATCH)

## 2023-10-13 LAB — HEMOGLOBIN AND HEMATOCRIT, BLOOD
HCT: 28.6 % — ABNORMAL LOW (ref 36.0–46.0)
Hemoglobin: 9.1 g/dL — ABNORMAL LOW (ref 12.0–15.0)

## 2023-10-13 MED ORDER — SODIUM CHLORIDE 0.9 % IV BOLUS: 500.0000 mL | Freq: Once | INTRAVENOUS | Status: DC

## 2023-10-13 MED ORDER — SODIUM CHLORIDE 0.9% IV SOLUTION: Freq: Once | INTRAVENOUS | Status: AC

## 2023-10-13 MED ORDER — SODIUM CHLORIDE 0.9 % IV BOLUS
500.0000 mL | INTRAVENOUS | Status: AC
  Administered 2023-10-13: 500 mL via INTRAVENOUS

## 2023-10-13 MED ORDER — ALBUMIN HUMAN 25 % IV SOLN
25.0000 g | INTRAVENOUS | Status: AC
  Administered 2023-10-13: 12.5 g via INTRAVENOUS
  Filled 2023-10-13: qty 100

## 2023-10-13 NOTE — Anesthesia Postprocedure Evaluation (Signed)
 Anesthesia Post Note  Patient: Kylie Hurst  Procedure(s) Performed: INSERTION, ENDOVASCULAR STENT GRAFT, AORTA, ABDOMINAL ENDARTERECTOMY, FEMORAL WITH VEIN PATCH ANGIOPLASTY (Right) ULTRASOUND GUIDANCE, FOR VASCULAR ACCESS (Bilateral)     Patient location during evaluation: PACU Anesthesia Type: General Level of consciousness: awake and alert Pain management: pain level controlled Vital Signs Assessment: post-procedure vital signs reviewed and stable Respiratory status: spontaneous breathing, nonlabored ventilation, respiratory function stable and patient connected to nasal cannula oxygen Cardiovascular status: blood pressure returned to baseline and stable Postop Assessment: no apparent nausea or vomiting Anesthetic complications: no   No notable events documented.            Lynwood MARLA Cornea

## 2023-10-13 NOTE — Plan of Care (Signed)
 Received a page from patient's nurse that blood pressure is soft MAP is 50 and per patient's nurse she was informed that the MAP supposed to be around 60. Patient underwent AAA repair with vascular surgery Dr. Sheree and being admitted under their care. Given I am not sure about patient's case and care in that case requested RN to reach out to on-call vascular surgery by the meantime to stabilize patient I have ordered 500 mL of NS bolus and albumin  25 g.  Kylie Schellenberg, MD [facility] 10/13/2023, 5:12 AM

## 2023-10-13 NOTE — Progress Notes (Addendum)
  Progress Note    10/13/2023 7:54 AM 1 Day Post-Op  Subjective:  no complaints   Vitals:   10/13/23 0620 10/13/23 0731  BP: (!) 95/59 (!) 81/47  Pulse: 75 76  Resp: 15 20  Temp:  98.5 F (36.9 C)  SpO2: 96% 94%   Physical Exam: Lungs:  non labored Incisions:  R groin c/d/I; L groin without hematoma Extremities:  palpable DP pulses Abdomen:  soft, NT, ND Neurologic: a&o  CBC    Component Value Date/Time   WBC 8.7 10/13/2023 0400   RBC 2.31 (L) 10/13/2023 0400   HGB 7.3 (L) 10/13/2023 0400   HCT 22.3 (L) 10/13/2023 0400   PLT 119 (L) 10/13/2023 0400   MCV 96.5 10/13/2023 0400   MCH 31.6 10/13/2023 0400   MCHC 32.7 10/13/2023 0400   RDW 13.6 10/13/2023 0400   LYMPHSABS 1.1 12/07/2020 1439   MONOABS 0.6 12/07/2020 1439   EOSABS 0.2 12/07/2020 1439   BASOSABS 0.0 12/07/2020 1439    BMET    Component Value Date/Time   NA 139 10/13/2023 0400   K 4.3 10/13/2023 0400   CL 106 10/13/2023 0400   CO2 23 10/13/2023 0400   GLUCOSE 136 (H) 10/13/2023 0400   BUN 17 10/13/2023 0400   CREATININE 0.97 10/13/2023 0400   CALCIUM  8.5 (L) 10/13/2023 0400   GFRNONAA >60 10/13/2023 0400   GFRAA >90 05/15/2014 0457    INR    Component Value Date/Time   INR 1.0 10/10/2023 1249     Intake/Output Summary (Last 24 hours) at 10/13/2023 0754 Last data filed at 10/13/2023 0630 Gross per 24 hour  Intake 2118.9 ml  Output 1650 ml  Net 468.9 ml     Assessment/Plan:  78 y.o. female is s/p EVAR with R groin cutdown 1 Day Post-Op   Subjectively feeling well today BLE well perfused with palpable DP pulses Groin incisions are well appearing Soft BP; continue fluid bolus; transfuse 1u pRBC; check post transfusion H&H Up to chair today if tolerable after blood administered   Donnice Sender, PA-C [facility] [phone] 10/13/2023 7:54 AM   VASCULAR STAFF ADDENDUM: I have independently interviewed and examined the patient. I agree with the above.   Asymptomatic this morning with pressures in the 80s.  Palpable pulses in the feet.  I am not sure if I believe the cuff pressure based off of her strong pedal pulses. Some anemia-will give 1 unit packed red blood cells Bedrest until blood cells given to prevent possible orthostatic hypotension  Fonda FORBES Rim MD [facility] Phone Number: [phone] 10/13/2023 7:58 AM

## 2023-10-14 LAB — BASIC METABOLIC PANEL WITH GFR
Anion gap: 7 (ref 5–15)
BUN: 16 mg/dL (ref 8–23)
CO2: 24 mmol/L (ref 22–32)
Calcium: 8.5 mg/dL — ABNORMAL LOW (ref 8.9–10.3)
Chloride: 110 mmol/L (ref 98–111)
Creatinine, Ser: 0.94 mg/dL (ref 0.44–1.00)
GFR, Estimated: >60 mL/min (ref >60)
Glucose, Bld: 121 mg/dL — ABNORMAL HIGH (ref 70–99)
Potassium: 4 mmol/L (ref 3.5–5.1)
Sodium: 141 mmol/L (ref 135–145)

## 2023-10-14 LAB — BPAM RBC
Blood Product Expiration Date: 202510032359
ISSUE DATE / TIME: 202509130845
Unit Type and Rh: 6200

## 2023-10-14 LAB — TYPE AND SCREEN
ABO/RH(D): A POS
Antibody Screen: NEGATIVE
Unit division: 0

## 2023-10-14 NOTE — Progress Notes (Addendum)
  Progress Note    10/14/2023 7:40 AM 2 Days Post-Op  Subjective:  no complaints   Vitals:   10/13/23 2025 10/14/23 0553  BP: 112/63 (!) 92/52  Pulse: 89 81  Resp: 20 15  Temp: 98.9 F (37.2 C) 98.4 F (36.9 C)  SpO2: 91% 93%   Physical Exam: Lungs:  non labored Incisions:  R groin with minimal sanguinous collection on dressing; L groin without hematoma Extremities:  palpable DP pulses Abdomen:  soft, NT, ND Neurologic: A&O  CBC    Component Value Date/Time   WBC 8.7 10/13/2023 0400   RBC 2.31 (L) 10/13/2023 0400   HGB 9.1 (L) 10/13/2023 1410   HCT 28.6 (L) 10/13/2023 1410   PLT 119 (L) 10/13/2023 0400   MCV 96.5 10/13/2023 0400   MCH 31.6 10/13/2023 0400   MCHC 32.7 10/13/2023 0400   RDW 13.6 10/13/2023 0400   LYMPHSABS 1.1 12/07/2020 1439   MONOABS 0.6 12/07/2020 1439   EOSABS 0.2 12/07/2020 1439   BASOSABS 0.0 12/07/2020 1439    BMET    Component Value Date/Time   NA 141 10/14/2023 0244   K 4.0 10/14/2023 0244   CL 110 10/14/2023 0244   CO2 24 10/14/2023 0244   GLUCOSE 121 (H) 10/14/2023 0244   BUN 16 10/14/2023 0244   CREATININE 0.94 10/14/2023 0244   CALCIUM  8.5 (L) 10/14/2023 0244   GFRNONAA >60 10/14/2023 0244   GFRAA >90 05/15/2014 0457    INR    Component Value Date/Time   INR 1.0 10/10/2023 1249     Intake/Output Summary (Last 24 hours) at 10/14/2023 0740 Last data filed at 10/13/2023 2300 Gross per 24 hour  Intake 437.5 ml  Output 200 ml  Net 237.5 ml     Assessment/Plan:  78 y.o. female is s/p EVAR with repair of R CFA 2 Days Post-Op   Subjectively feeling well Post transfusion Hgb 9; recheck CBC tomorrow morning BLE well perfused with palpable DP pulses; R groin incision without bleeding; dry dressing change daily Ambulate patient today Possibly home tomorrow   Donnice Sender, PA-C [facility] [phone] 10/14/2023 7:40 AM  VASCULAR STAFF ADDENDUM: I agree with the above.  Pressure improved,  possible home tomorrow   Fonda FORBES Rim MD [facility] Phone Number: [phone] 10/14/2023 7:47 AM

## 2023-10-14 NOTE — Plan of Care (Signed)

## 2023-10-15 ENCOUNTER — Inpatient Hospital Stay (HOSPITAL_COMMUNITY)

## 2023-10-15 ENCOUNTER — Encounter (HOSPITAL_COMMUNITY): Payer: Self-pay | Admitting: Vascular Surgery

## 2023-10-15 LAB — BASIC METABOLIC PANEL WITH GFR
Anion gap: 6 (ref 5–15)
BUN: 13 mg/dL (ref 8–23)
CO2: 26 mmol/L (ref 22–32)
Calcium: 8.6 mg/dL — ABNORMAL LOW (ref 8.9–10.3)
Chloride: 107 mmol/L (ref 98–111)
Creatinine, Ser: 0.89 mg/dL (ref 0.44–1.00)
GFR, Estimated: >60 mL/min (ref >60)
Glucose, Bld: 106 mg/dL — ABNORMAL HIGH (ref 70–99)
Potassium: 3.9 mmol/L (ref 3.5–5.1)
Sodium: 139 mmol/L (ref 135–145)

## 2023-10-15 LAB — CBC
HCT: 25.8 % — ABNORMAL LOW (ref 36.0–46.0)
Hemoglobin: 8.4 g/dL — ABNORMAL LOW (ref 12.0–15.0)
MCH: 31.9 pg (ref 26.0–34.0)
MCHC: 32.6 g/dL (ref 30.0–36.0)
MCV: 98.1 fL (ref 80.0–100.0)
Platelets: 116 K/uL — ABNORMAL LOW (ref 150–400)
RBC: 2.63 MIL/uL — ABNORMAL LOW (ref 3.87–5.11)
RDW: 14.6 % (ref 11.5–15.5)
WBC: 7.2 K/uL (ref 4.0–10.5)
nRBC: 0 % (ref 0.0–0.2)

## 2023-10-15 MED ORDER — HYDROCHLOROTHIAZIDE 12.5 MG PO TABS
12.5000 mg | ORAL_TABLET | Freq: Every day | ORAL | Status: DC
  Administered 2023-10-15 – 2023-10-19 (×4): 12.5 mg via ORAL
  Filled 2023-10-15 (×5): qty 1

## 2023-10-15 MED ORDER — FUROSEMIDE 10 MG/ML IJ SOLN
20.0000 mg | Freq: Once | INTRAMUSCULAR | Status: AC
  Administered 2023-10-15: 20 mg via INTRAVENOUS
  Filled 2023-10-15: qty 2

## 2023-10-15 NOTE — Progress Notes (Signed)
 Nurse requested Mobility Specialist to perform oxygen saturation test with pt which includes removing pt from oxygen both at rest and while ambulating.  Below are the results from that testing.     Patient Saturations on Room Air at Rest = spO2 90%  Patient Saturations on Room Air while Ambulating = sp02 77% .  Rested and performed pursed lip breathing for 1 minute with sp02 at 71%.  Patient Saturations on 8 Liters of oxygen while Ambulating = sp02 89%  At end of testing pt left in room on 3  Liters of oxygen.  Reported results to nurse.    Kaydie Petsch Mobility Specialist Please contact via Special educational needs teacher or  Rehab office at [phone]

## 2023-10-15 NOTE — Progress Notes (Signed)
 Changed patient's dressing on the right groin, but pt was not comfortable leaving the incision open to air.

## 2023-10-15 NOTE — Progress Notes (Signed)
   10/15/23 1045  Spiritual Encounters  Type of Visit Initial  Care provided to: Patient  Referral source Clinical staff  Reason for visit Advance directives  OnCall Visit No  Spiritual Framework  Presenting Themes Impactful experiences and emotions  Community/Connection Family  Patient Stress Factors Health changes  Family Stress Factors None identified  Interventions  Spiritual Care Interventions Made Compassionate presence;Established relationship of care and support  Intervention Outcomes  Outcomes Awareness of support;Awareness of health   Chaplain visited Pt to discuss advance directive. Pt state that her niece, Madelin Ee, is designated as her power of attorney for healthcare decision. Chaplain provided presence and support. Chaplain services remain available as needed.

## 2023-10-15 NOTE — Progress Notes (Signed)
 Mobility Specialist Progress Note:   10/15/23 0915  Mobility  Activity Ambulated with assistance  Level of Assistance Minimal assist, patient does 75% or more  Assistive Device Front wheel walker  Distance Ambulated (ft) 60 ft  Activity Response Tolerated well  Mobility Referral Yes  Mobility visit 1 Mobility  Mobility Specialist Start Time (ACUTE ONLY) [id number]  Mobility Specialist Stop Time (ACUTE ONLY) 0915  Mobility Specialist Time Calculation (min) (ACUTE ONLY) 24 min   Pt received in bed, agreeable to mobility session. Transferred B>BSC via MinG and RW. Tolerated well. SpO2 90% on RA, SpO2 77% on RA after transfer. Increased O2 flow throughout session to remain [facility]. SpO2 89% on 8L upon arrival back to room. Assisted pt back to bed. Left pt in care of respiratory team. RN notified. All needs met.  Earlie Schank Mobility Specialist Please contact via Special educational needs teacher or  Rehab office at [phone]

## 2023-10-15 NOTE — Plan of Care (Signed)

## 2023-10-15 NOTE — Progress Notes (Addendum)
  Progress Note    10/15/2023 6:46 AM 3 Days Post-Op  Subjective:  wants to get up and walk so she can go home. She is not on oxygen at home.  Tm 99.3 now afebrile HR 70's-90's  120's-130's systolic 95% 3LO2NC  Vitals:   10/14/23 2146 10/15/23 0627  BP: 127/66 125/66  Pulse: 87 75  Resp: 17 (!) 23  Temp: 99.3 F (37.4 C) 98.9 F (37.2 C)  SpO2: 96% 95%    Physical Exam: General:  resting in bed in no distress Cardiac:  regular Lungs:  non labored on O2 Incisions:  right groin with dry gauze in place; left groin soft; ecchymosis bilateral groins and pubis  Extremities:  palpable DP pulses bilaterally Abdomen:  soft  CBC    Component Value Date/Time   WBC 7.2 10/15/2023 0331   RBC 2.63 (L) 10/15/2023 0331   HGB 8.4 (L) 10/15/2023 0331   HCT 25.8 (L) 10/15/2023 0331   PLT 116 (L) 10/15/2023 0331   MCV 98.1 10/15/2023 0331   MCH 31.9 10/15/2023 0331   MCHC 32.6 10/15/2023 0331   RDW 14.6 10/15/2023 0331   LYMPHSABS 1.1 12/07/2020 1439   MONOABS 0.6 12/07/2020 1439   EOSABS 0.2 12/07/2020 1439   BASOSABS 0.0 12/07/2020 1439    BMET    Component Value Date/Time   NA 139 10/15/2023 0331   K 3.9 10/15/2023 0331   CL 107 10/15/2023 0331   CO2 26 10/15/2023 0331   GLUCOSE 106 (H) 10/15/2023 0331   BUN 13 10/15/2023 0331   CREATININE 0.89 10/15/2023 0331   CALCIUM  8.6 (L) 10/15/2023 0331   GFRNONAA >60 10/15/2023 0331   GFRAA >90 05/15/2014 0457    INR    Component Value Date/Time   INR 1.0 10/10/2023 1249     Intake/Output Summary (Last 24 hours) at 10/15/2023 0646 Last data filed at 10/14/2023 2100 Gross per 24 hour  Intake 480 ml  Output 200 ml  Net 280 ml      Assessment/Plan:  78 y.o. female is s/p:   Endovascular aortic aneurysm repair, open exposure right CFA with endarterectomy and vein patch angioplasty 10/12/2023 by Dr. Sheree  3 Days Post-Op   -pt with palpable DP pulses bilaterally -right groin with dry gauze in place and clean   -wean of O2, mobilize  -DVT prophylaxis:  sq heparin    Lucie Apt, PA-C [facility] [phone] 10/15/2023 6:46 AM  I have independently interviewed and examined patient and agree with PA assessment and plan above. Progressive pulmonary edema on cxr today. Hydrochlorothiazide  home dose restarted with 20mg  IV lasix  one time.   Donnica Jarnagin C. Sheree, MD [facility] [place] Office: [phone] Pager: [phone]

## 2023-10-16 MED ORDER — FUROSEMIDE 10 MG/ML IJ SOLN
20.0000 mg | Freq: Once | INTRAMUSCULAR | Status: AC
Start: 2023-10-16 — End: 2023-10-16
  Administered 2023-10-16: 20 mg via INTRAVENOUS
  Filled 2023-10-16: qty 2

## 2023-10-16 NOTE — Progress Notes (Signed)
  Progress Note    10/16/2023 6:42 AM 4 Days Post-Op  Subjective:  feels her breathing is a little bit better.  Afebrile HR 60's-80's  110's-130's systolic 98% 3LO2NC    Vitals:   10/16/23 0013 10/16/23 0529  BP: (!) 113/52 115/63  Pulse: 75 71  Resp: 17 19  Temp: 98.3 F (36.8 C) 98.2 F (36.8 C)  SpO2: 95% 98%    Physical Exam: Awake alert and oriented Nonlabored respirations Right groin healing well with staples in place Right dorsalis pedis pulses 2+ and left is 1+  CBC    Component Value Date/Time   WBC 7.2 10/15/2023 0331   RBC 2.63 (L) 10/15/2023 0331   HGB 8.4 (L) 10/15/2023 0331   HCT 25.8 (L) 10/15/2023 0331   PLT 116 (L) 10/15/2023 0331   MCV 98.1 10/15/2023 0331   MCH 31.9 10/15/2023 0331   MCHC 32.6 10/15/2023 0331   RDW 14.6 10/15/2023 0331   LYMPHSABS 1.1 12/07/2020 1439   MONOABS 0.6 12/07/2020 1439   EOSABS 0.2 12/07/2020 1439   BASOSABS 0.0 12/07/2020 1439    BMET    Component Value Date/Time   NA 139 10/15/2023 0331   K 3.9 10/15/2023 0331   CL 107 10/15/2023 0331   CO2 26 10/15/2023 0331   GLUCOSE 106 (H) 10/15/2023 0331   BUN 13 10/15/2023 0331   CREATININE 0.89 10/15/2023 0331   CALCIUM  8.6 (L) 10/15/2023 0331   GFRNONAA >60 10/15/2023 0331   GFRAA >90 05/15/2014 0457    INR    Component Value Date/Time   INR 1.0 10/10/2023 1249    No intake or output data in the 24 hours ending 10/16/23 9357    Assessment/Plan:  78 y.o. female is s/p:  Endovascular aortic aneurysm repair, open exposure right CFA with endarterectomy and vein patch angioplasty 10/12/2023 by Dr. Sheree   4 Days Post-Op   -pt with palpable DP pulses bilaterally; right groin incision in tact with staples and mild ecchymosis -lives in assisted living.  Needs to increase mobilization and work with incentive spirometry.  Order placed to get out of bed to chair tid at meal times.   -DVT prophylaxis:  sq heparin  -check labs in am  Lucie Apt,  [place] [facility] [phone] 10/16/2023 6:42 AM   I have independently interviewed and examined patient and agree with PA assessment and plan above.  Additional dose of Lasix  given today.  Continue to mobilize.  Mell Guia C. Sheree, MD [facility] [place] Office: [phone] Pager: [phone]

## 2023-10-16 NOTE — TOC Initial Note (Signed)
 Transition of Care [facility]) - Initial/Assessment Note    Patient Details  Name: Kylie Hurst MRN: [id number] Date of Birth: July 10, 1945  [facility] CM/SW Contact:    Montie LOISE Louder, LCSW Phone Number: 10/16/2023, 3:05 PM  Clinical Narrative:          CSW met with patient at bedside. CSW introduced self and explained role. Patient informed CSW she resides at [facility] ALF and expects to return once stable.   TOC will continue to follow and assist with discharge planning.  Montie Louder, MSW, LCSW Clinical Social Worker           Expected Discharge Plan: Assisted Living Barriers to Discharge: Continued Medical Work up   Patient Goals and CMS Choice            Expected Discharge Plan and Services In-house Referral: Clinical Social Work     Living arrangements for the past 2 months: Assisted Living Facility                                      Prior Living Arrangements/Services Living arrangements for the past 2 months: Assisted Living Facility Lives with:: Self          Need for Family Participation in Patient Care: Yes (Comment) Care giver support system in place?: Yes (comment)      Activities of Daily Living   ADL Screening (condition at time of admission) Independently performs ADLs?: Yes (appropriate for developmental age) Is the patient deaf or have difficulty hearing?: No Does the patient have difficulty seeing, even when wearing glasses/contacts?: No Does the patient have difficulty concentrating, remembering, or making decisions?: Yes (at tmes but not concerned about it)  Permission Sought/Granted                  Emotional Assessment Appearance:: Appears stated age   Affect (typically observed): Appropriate Orientation: : Oriented to Self, Oriented to Place, Oriented to  Time, Oriented to Situation Alcohol / Substance Use: Not Applicable Psych Involvement: No (comment)  Admission diagnosis:   Infrarenal abdominal aortic aneurysm (AAA) without rupture (HCC) [I71.43] 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 [S04.171] AAA (abdominal aortic aneurysm) (HCC) [I71.40] Patient Active Problem List   Diagnosis Date Noted   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 10/12/2023   AAA (abdominal aortic aneurysm) (HCC) 10/12/2023   Arthritis    Back spasm    Chronic back pain    Depression    Diabetes mellitus without complication (HCC)    Environmental and seasonal allergies    Neuromuscular disorder (HCC)    Onycholysis of toenail    Restless legs    Wears glasses    Preop cardiovascular exam    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   Dementia (HCC) 12/07/2020   Acquired hammer toe of left foot 05/26/2020   Allergic rhinitis due to pollen 05/26/2020   Atherosclerosis 05/26/2020   Benign neoplasm of colon 05/26/2020   Chronic obstructive pulmonary disease, unspecified (HCC) 05/26/2020   Constipation 05/26/2020   Decreased estrogen level 05/26/2020   Hardening of the aorta (main artery of the heart) (HCC) 05/26/2020   Hypercalcemia 05/26/2020   Hyperlipidemia 05/26/2020   Hypertension 05/26/2020   Hypokalemia 05/26/2020   Osteoporosis 05/26/2020   History of colonic polyps 05/26/2020   Rectocele 05/26/2020   Skin sensation disturbance 05/26/2020  Tobacco dependence 05/26/2020   Vaginal bleeding 05/26/2020   Pain due to onychomycosis of toenails of both feet 07/17/2018   Diabetes mellitus (HCC) 07/17/2018   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 [place] [address] Phone: [phone] Fax: [phone]     Social Drivers of Health (SDOH) Social History: SDOH Screenings   Food Insecurity: No Food Insecurity (10/12/2023)  Housing: Low Risk  (10/12/2023)  Transportation Needs: No Transportation Needs (10/12/2023)  Utilities: Not At Risk (10/12/2023)  Depression (PHQ2-9): Low Risk  (06/11/2020)  Social Connections: Moderately Isolated (10/12/2023)  Tobacco Use: Medium Risk (10/12/2023)   SDOH Interventions:     Readmission Risk Interventions     No data to display

## 2023-10-16 NOTE — Evaluation (Signed)
 Physical Therapy Evaluation Patient Details Name: Kylie Hurst MRN: [id number] DOB: Dec 22, 1945 Today's Date: 10/16/2023  History of Present Illness  78 y.o. female presents to [facility] hospital on 10/12/2023 for repair of abdominal aortic aneurysm. Pt underwent endovascular aortic aneurysm repair on 9/12. PMH includes OA, dementia, depression, DM, emphysema, HLD, HTN, RLS, ACDF.  Clinical Impression  Pt presents to PT with deficits in functional mobility, gait, balance, power, endurance. Pt fatigues quickly and demonstrates increased work of breathing when transferring and ambulating, desaturating to at least 89% after mobilizing on 4L [place]. Pt will benefit from frequent mobilization in an effort to improve activity tolerance and pulmonary function. PT recommends discharge home with HHPT.        If plan is discharge home, recommend the following: A little help with walking and/or transfers;A little help with bathing/dressing/bathroom;Assistance with cooking/housework;Assist for transportation;Help with stairs or ramp for entrance   Can travel by private vehicle        Equipment Recommendations None recommended by PT  Recommendations for Other Services       Functional Status Assessment Patient has had a recent decline in their functional status and demonstrates the ability to make significant improvements in function in a reasonable and predictable amount of time.     Precautions / Restrictions Precautions Precautions: Fall Recall of Precautions/Restrictions: Intact Precaution/Restrictions Comments: monitor SpO2 Restrictions Weight Bearing Restrictions Per Provider Order: No      Mobility  Bed Mobility Overal bed mobility: Needs Assistance Bed Mobility: Supine to Sit, Sit to Supine     Supine to sit: Min assist, HOB elevated Sit to supine: Contact guard assist        Transfers Overall transfer level: Needs assistance Equipment used: Rolling walker (2 wheels),  None Transfers: Sit to/from Stand, Bed to chair/wheelchair/BSC Sit to Stand: Contact guard assist   Step pivot transfers: Contact guard assist            Ambulation/Gait Ambulation/Gait assistance: Supervision Gait Distance (Feet): 200 Feet Assistive device: Rolling walker (2 wheels) Gait Pattern/deviations: Step-through pattern Gait velocity: reduced Gait velocity interpretation: <1.8 ft/sec, indicate of risk for recurrent falls   General Gait Details: slowed step-through gait  Stairs            Wheelchair Mobility     Tilt Bed    Modified Rankin (Stroke Patients Only)       Balance Overall balance assessment: Needs assistance Sitting-balance support: No upper extremity supported, Feet supported Sitting balance-Leahy Scale: Good     Standing balance support: No upper extremity supported, During functional activity Standing balance-Leahy Scale: Fair                               Pertinent Vitals/Pain Pain Assessment Pain Assessment: Faces Faces Pain Scale: Hurts little more Pain Location: generalized Pain Descriptors / Indicators: Grimacing Pain Intervention(s): Monitored during session    Home Living Family/patient expects to be discharged to:: Assisted living                 Home Equipment: Rollator (4 wheels)      Prior Function Prior Level of Function : Needs assist             Mobility Comments: ambulatory without DME in the apartment, utilizes rollator PRN for community distances ADLs Comments: facility provides meals and manages medications     Extremity/Trunk Assessment   Upper Extremity Assessment Upper Extremity Assessment: Generalized weakness  Lower Extremity Assessment Lower Extremity Assessment: Generalized weakness    Cervical / Trunk Assessment Cervical / Trunk Assessment: Kyphotic  Communication   Communication Communication: No apparent difficulties    Cognition Arousal: Alert Behavior  During Therapy: Flat affect   PT - Cognitive impairments: Awareness                         Following commands: Intact       Cueing Cueing Techniques: Verbal cues     General Comments General comments (skin integrity, edema, etc.): pt on 3L [place], sats in mid-90s at rest. Pt sats reading unreliably when mobilizing as portable monitor is not functioning when removed and portable pulse ox without accurate reading. PT increases pt to 4L [place], SpO2 found to be 89% when seated after ambulation, pt recovers quickly to mid-90s    Exercises     Assessment/Plan    PT Assessment Patient needs continued PT services  PT Problem List Decreased strength;Decreased activity tolerance;Decreased balance;Decreased mobility;Cardiopulmonary status limiting activity       PT Treatment Interventions DME instruction;Gait training;Functional mobility training;Therapeutic activities;Therapeutic exercise;Balance training;Neuromuscular re-education;Patient/family education    PT Goals (Current goals can be found in the Care Plan section)  Acute Rehab PT Goals Patient Stated Goal: to return to independence PT Goal Formulation: With patient Time For Goal Achievement: 10/30/23 Potential to Achieve Goals: Fair Additional Goals Additional Goal #1: Pt will report 2/4 DOE or less when ambulating for >250' to demonstrate improved activity tolerance    Frequency Min 2X/week     Co-evaluation               AM-PAC PT 6 Clicks Mobility  Outcome Measure Help needed turning from your back to your side while in a flat bed without using bedrails?: A Little Help needed moving from lying on your back to sitting on the side of a flat bed without using bedrails?: A Little Help needed moving to and from a bed to a chair (including a wheelchair)?: A Little Help needed standing up from a chair using your arms (e.g., wheelchair or bedside chair)?: A Little Help needed to walk in hospital room?: A  Little Help needed climbing 3-5 steps with a railing? : A Lot 6 Click Score: 17    End of Session Equipment Utilized During Treatment: Gait belt;Oxygen Activity Tolerance: Patient tolerated treatment well Patient left: in bed;with call bell/phone within reach;with bed alarm set Nurse Communication: Mobility status PT Visit Diagnosis: Other abnormalities of gait and mobility (R26.89);Muscle weakness (generalized) (M62.81)    Time: 8390-8347 PT Time Calculation (min) (ACUTE ONLY): 43 min   Charges:   PT Evaluation $PT Eval Low Complexity: 1 Low PT Treatments $Therapeutic Activity: 8-22 mins PT General Charges $$ ACUTE PT VISIT: 1 Visit         Kylie Hurst, PT, DPT Acute Rehabilitation Office [phone]   Kylie Hurst 10/16/2023, 5:08 PM

## 2023-10-16 NOTE — Progress Notes (Signed)
 Mobility Specialist Progress Note:   10/16/23 1230  Mobility  Activity Ambulated with assistance  Level of Assistance Contact guard assist, steadying assist  Assistive Device Front wheel walker  Distance Ambulated (ft) 1230 ft  Activity Response Tolerated well  Mobility Referral Yes  Mobility visit 1 Mobility  Mobility Specialist Start Time (ACUTE ONLY) 1230  Mobility Specialist Stop Time (ACUTE ONLY) 1245  Mobility Specialist Time Calculation (min) (ACUTE ONLY) 15 min   Pt agreeable to mobility session. Required only minG assist during ambulation. Unfortunately unable to get SpO2 reading during ambulation d/t monitor malfunction. However, SpO2 before ambulation on 3LO2: 96, after ambulation on 3L: 87%. Recovered to >90% quickly with seated rest. Pt left on EOB d/t needing a bath after episode of urine incontinence.  Therisa Rana Mobility Specialist Please contact via SecureChat or  Rehab office at [phone]

## 2023-10-17 LAB — CBC
HCT: 29.3 % — ABNORMAL LOW (ref 36.0–46.0)
Hemoglobin: 9.5 g/dL — ABNORMAL LOW (ref 12.0–15.0)
MCH: 30.7 pg (ref 26.0–34.0)
MCHC: 32.4 g/dL (ref 30.0–36.0)
MCV: 94.8 fL (ref 80.0–100.0)
Platelets: 161 K/uL (ref 150–400)
RBC: 3.09 MIL/uL — ABNORMAL LOW (ref 3.87–5.11)
RDW: 14 % (ref 11.5–15.5)
WBC: 6.3 K/uL (ref 4.0–10.5)
nRBC: 0 % (ref 0.0–0.2)

## 2023-10-17 NOTE — Evaluation (Signed)
 Occupational Therapy Evaluation Patient Details Name: Kylie Hurst MRN: [id number] DOB: 1945-03-05 Today's Date: 10/17/2023   History of Present Illness   78 y.o. female presents to [facility] hospital on 10/12/2023 for repair of abdominal aortic aneurysm. Pt underwent endovascular aortic aneurysm repair on 9/12. PMH includes OA, dementia, depression, DM, emphysema, HLD, HTN, RLS, ACDF.     Clinical Impressions Pt admitted based on above, and was seen based on problem list below. PTA pt was living at ALF and reports mod I with ADLs. Today pt is requiring set up  to CGA for ADLs. Functional transfers are  CGA with use of RW. Pt fatigues quickly and required x2 rest breaks with toileting and hygiene. Unable to obtain O2 during session d/t pulse ox difficulties; at end of session resting on 2L pt stating 92% and greater. Anticipate pt will progress well No follow up OT needs. Pt would benefit from further acute OT services to increase activity tolerance and provide further education on energy conservation strategies.        If plan is discharge home, recommend the following:   A little help with walking and/or transfers;Assistance with cooking/housework     Functional Status Assessment   Patient has had a recent decline in their functional status and demonstrates the ability to make significant improvements in function in a reasonable and predictable amount of time.     Equipment Recommendations   None recommended by OT      Precautions/Restrictions   Precautions Precautions: Fall Recall of Precautions/Restrictions: Intact Precaution/Restrictions Comments: monitor SpO2 Restrictions Weight Bearing Restrictions Per Provider Order: No     Mobility Bed Mobility Overal bed mobility: Modified Independent       General bed mobility comments: No assist, HOB elevated    Transfers Overall transfer level: Needs assistance Equipment used: Rolling walker (2 wheels) Transfers:  Sit to/from Stand Sit to Stand: Contact guard assist     General transfer comment: CGA , good hand placement. Once in standing, CGA for mobility      Balance Overall balance assessment: Needs assistance Sitting-balance support: No upper extremity supported, Feet supported Sitting balance-Leahy Scale: Good     Standing balance support: No upper extremity supported, During functional activity Standing balance-Leahy Scale: Fair         ADL either performed or assessed with clinical judgement   ADL Overall ADL's : Needs assistance/impaired Eating/Feeding: Set up;Sitting   Grooming: Set up;Sitting;Wash/dry hands Grooming Details (indicate cue type and reason): D/t fatigue         Upper Body Dressing : Set up;Sitting   Lower Body Dressing: Contact guard assist;Sit to/from stand Lower Body Dressing Details (indicate cue type and reason): CGA for standing balance, cueing to figure 4 for socks Toilet Transfer: Contact guard assist;Rolling walker (2 wheels);Regular Toilet;Ambulation Toilet Transfer Details (indicate cue type and reason): CGA for alance Toileting- Clothing Manipulation and Hygiene: Contact guard assist;Sit to/from stand Toileting - Clothing Manipulation Details (indicate cue type and reason): Pt with flexed trunk causing increased fatigue requiring x2 rest breaks     Functional mobility during ADLs: Contact guard assist;Rolling walker (2 wheels) General ADL Comments: CGA d/t increased fatigue     Vision Baseline Vision/History: 0 No visual deficits Patient Visual Report: No change from baseline Vision Assessment?: No apparent visual deficits            Pertinent Vitals/Pain Pain Assessment Pain Assessment: Faces Faces Pain Scale: Hurts little more Pain Location: generalized Pain Descriptors / Indicators: Grimacing Pain  Intervention(s): Monitored during session     Extremity/Trunk Assessment Upper Extremity Assessment Upper Extremity Assessment:  Generalized weakness   Lower Extremity Assessment Lower Extremity Assessment: Defer to PT evaluation   Cervical / Trunk Assessment Cervical / Trunk Assessment: Kyphotic   Communication Communication Communication: No apparent difficulties   Cognition Arousal: Alert Behavior During Therapy: WFL for tasks assessed/performed Cognition: No apparent impairments   Following commands: Intact       Cueing  General Comments   Cueing Techniques: Verbal cues  Pt received on 3L stats in mid 90s, decreased to 2L, unable to register O2 levels during session d/t portable monitor not working and personal pulse ox not reading. O2 at end of session on 2L low 90s           Home Living Family/patient expects to be discharged to:: Assisted living       Home Equipment: Rollator (4 wheels)          Prior Functioning/Environment Prior Level of Function : Independent/Modified Independent             Mobility Comments: ambulatory without DME in the apartment, utilizes rollator PRN for community distances ADLs Comments: Pt mod I with ADLs, ALF manages meds    OT Problem List: Decreased strength;Decreased range of motion;Decreased activity tolerance;Impaired balance (sitting and/or standing);Decreased safety awareness;Decreased knowledge of use of DME or AE;Decreased knowledge of precautions;Cardiopulmonary status limiting activity   OT Treatment/Interventions: Self-care/ADL training;Therapeutic exercise;Energy conservation;DME and/or AE instruction;Therapeutic activities;Balance training;Patient/family education      OT Goals(Current goals can be found in the care plan section)   Acute Rehab OT Goals Patient Stated Goal: To get better OT Goal Formulation: With patient Time For Goal Achievement: 10/31/23 Potential to Achieve Goals: Good   OT Frequency:  Min 2X/week       AM-PAC OT 6 Clicks Daily Activity     Outcome Measure Help from another person eating meals?:  None Help from another person taking care of personal grooming?: A Little Help from another person toileting, which includes using toliet, bedpan, or urinal?: A Little Help from another person bathing (including washing, rinsing, drying)?: A Little Help from another person to put on and taking off regular upper body clothing?: A Little Help from another person to put on and taking off regular lower body clothing?: A Little 6 Click Score: 19   End of Session Equipment Utilized During Treatment: Gait belt;Rolling walker (2 wheels);Oxygen Nurse Communication: Mobility status  Activity Tolerance: Patient tolerated treatment well Patient left: in bed;with call bell/phone within reach;with bed alarm set  OT Visit Diagnosis: Unsteadiness on feet (R26.81);Other abnormalities of gait and mobility (R26.89);Repeated falls (R29.6);Muscle weakness (generalized) (M62.81)                Time: 9264-9197 OT Time Calculation (min): 27 min Charges:  OT General Charges $OT Visit: 1 Visit OT Evaluation $OT Eval Moderate Complexity: 1 Mod OT Treatments $Self Care/Home Management : 8-22 mins  Adrianne BROCKS, OT  Acute Rehabilitation Services Office [phone] Secure chat preferred   Adrianne GORMAN Savers 10/17/2023, 8:19 AM

## 2023-10-17 NOTE — Progress Notes (Signed)
 SATURATION QUALIFICATIONS: (This note is used to comply with regulatory documentation for home oxygen)  Patient Saturations on Room Air at Rest = 84%  Patient Saturations on Room Air while Ambulating = N/A  Patient Saturations on 3 Liters of oxygen while Ambulating = 88%  Please briefly explain why patient needs home oxygen: Pt hypoxic on RA at rest. Needed at least 3L/min O2 [place] to maintain SpO2 88% and above.

## 2023-10-17 NOTE — Progress Notes (Signed)
 Physical Therapy Treatment Patient Details Name: Kylie Hurst MRN: [id number] DOB: 05/08/1945 Today's Date: 10/17/2023   History of Present Illness 78 y.o. female presents to [facility] hospital on 10/12/2023 for repair of abdominal aortic aneurysm. Pt underwent endovascular aortic aneurysm repair on 9/12. PMH includes OA, dementia, depression, DM, emphysema, HLD, HTN, RLS, ACDF.    PT Comments  Pt received in supine, alert and mildly agitated throughout, pt c/o time of day and difficult to be redirected due to perseveration on annoyance with sound of therapist voice and most cues given. RN/NT notified pt behavioral change, corresponding with later time of day. Pt requests therapy sessions earlier in the day, but also reports she does not normally get back to bed until 11pm at home. Pt needing up to Supervision to perform transfers with RW support and for longer household distance gait trial with RW. SpO2 hypoxic on RA at rest and needing up to 3L/min O2 [place] to maintain SpO2 88% and greater with exertion. Pt sitting EOB with NT outside room preparing to assist her at end of session as pt not allowing PTA to assist her with return to supine. Pt continues to benefit from PT services to progress toward functional mobility goals, pt may benefit from hired caregivers PRN at home to ensure safety if still needing supplemental O2 upon DC in addition to [facility] services.    If plan is discharge home, recommend the following: A little help with walking and/or transfers;A little help with bathing/dressing/bathroom;Assistance with cooking/housework;Assist for transportation;Help with stairs or ramp for entrance   Can travel by private vehicle        Equipment Recommendations  None recommended by PT    Recommendations for Other Services       Precautions / Restrictions Precautions Precautions: Fall Recall of Precautions/Restrictions: Intact Precaution/Restrictions Comments: monitor SpO2; possible  sundowning Restrictions Weight Bearing Restrictions Per Provider Order: No     Mobility  Bed Mobility Overal bed mobility: Modified Independent Bed Mobility: Supine to Sit     Supine to sit: Modified independent (Device/Increase time), HOB elevated, Used rails     General bed mobility comments: No assist, HOB elevated    Transfers Overall transfer level: Needs assistance Equipment used: Rolling walker (2 wheels) Transfers: Sit to/from Stand Sit to Stand: Supervision           General transfer comment: from EOB>rollator, rollator switched for RW while standing, then RW>EOB x2 more reps. Occasional cues needed for safe UE placement.    Ambulation/Gait Ambulation/Gait assistance: Supervision Gait Distance (Feet): 200 Feet Assistive device: Rolling walker (2 wheels) Gait Pattern/deviations: Step-through pattern Gait velocity: reduced     General Gait Details: slowed step-through gait pattern, min cues for activity pacing and pt c/o dypsnea during amb sats assessment, SpO2 88-90% first 172ft, desat to 87% on 2L and increased to 3L/min, improves to 88% for remaining 140ft; once seated, SpO2 95% on 3L/min within 1 minute rest break.   Stairs             Wheelchair Mobility     Tilt Bed    Modified Rankin (Stroke Patients Only)       Balance Overall balance assessment: Needs assistance Sitting-balance support: No upper extremity supported, Feet supported Sitting balance-Leahy Scale: Good     Standing balance support: No upper extremity supported, During functional activity Standing balance-Leahy Scale: Fair Standing balance comment: RW for dynamic tasks  Communication Communication Communication: No apparent difficulties  Cognition Arousal: Alert Behavior During Therapy: WFL for tasks assessed/performed, Agitated   PT - Cognitive impairments: Orientation, Safety/Judgement   Orientation impairments: Time                    PT - Cognition Comments: Difficult to please; pt c/o frustration with sound of therapist voice and time of day, requesting to be seen earlier in the day. When PTA mentioned that MD wants her up OOB to chair or ambulating TID, pt states well I've only been up once, I'll have to tell the Dr. that, but then continuously c/o feeling tired and frustration with having to walk later in the day. Pt reports she goes to bed around 11pm and does move around until that time. Pt appears frustrated with all requests from therapist. Following commands: Intact      Cueing Cueing Techniques: Verbal cues, Gestural cues  Exercises      General Comments General comments (skin integrity, edema, etc.): SpO2 87% on 2L with exertion, needed 3L to maintain 88% and greater; HR to ~104 bpm with exertion. SpO2 WFL on 2L resting at EOB. NT called to room to assist with returning pt to supine as pt refusing to allow PTA to assist her back to bed and not receptive to instruction or ideas on repositioning for comfort. Pt sitting EOB and NT outside room preparing to enter to assist her at end of session, call bell at side and tray table in front of her.      Pertinent Vitals/Pain Pain Assessment Pain Assessment: Faces Faces Pain Scale: Hurts little more Pain Location: generalized Pain Descriptors / Indicators: Grimacing Pain Intervention(s): Limited activity within patient's tolerance, Monitored during session, Repositioned    Home Living                          Prior Function            PT Goals (current goals can now be found in the care plan section) Acute Rehab PT Goals Patient Stated Goal: to return to independence PT Goal Formulation: With patient Time For Goal Achievement: 10/30/23 Progress towards PT goals: Progressing toward goals    Frequency    Min 2X/week      PT Plan      Co-evaluation              AM-PAC PT 6 Clicks Mobility   Outcome Measure   Help needed turning from your back to your side while in a flat bed without using bedrails?: A Little Help needed moving from lying on your back to sitting on the side of a flat bed without using bedrails?: A Little Help needed moving to and from a bed to a chair (including a wheelchair)?: A Little Help needed standing up from a chair using your arms (e.g., wheelchair or bedside chair)?: A Little Help needed to walk in hospital room?: A Little Help needed climbing 3-5 steps with a railing? : A Lot 6 Click Score: 17    End of Session Equipment Utilized During Treatment: Gait belt;Oxygen Activity Tolerance: Patient tolerated treatment well;Treatment limited secondary to agitation Patient left: in bed;with call bell/phone within reach;Other (comment) (bed alarm not set as pt refusing to return to supine and NT preparing to come to room to assist her instead) Nurse Communication: Mobility status;Precautions;Other (comment) (pt agitation) PT Visit Diagnosis: Other abnormalities of gait and mobility (R26.89);Muscle weakness (generalized) (  M62.81)     Time: 8290-8269 PT Time Calculation (min) (ACUTE ONLY): 21 min  Charges:    $Gait Training: 8-22 mins PT General Charges $$ ACUTE PT VISIT: 1 Visit                     Kylie Hurst P., PTA Acute Rehabilitation Services Secure Chat Preferred 9a-5:30pm Office: [phone]    Connell HERO [facility] 10/17/2023, 5:55 PM

## 2023-10-17 NOTE — Progress Notes (Addendum)
  Progress Note    10/17/2023 6:48 AM 5 Days Post-Op  Subjective:  says she felt like her breathing was a little labored last night but feels the lasix  that she got yesterday helped her breathing.  afebrile  Vitals:   10/16/23 1500 10/16/23 2210  BP: 125/71   Pulse:    Resp: 18   Temp: 98.3 F (36.8 C)   SpO2:  96%    Physical Exam: General:  no distress Lungs:  non labored Incisions:  not examined as she was laying on her side Extremities:  bilateral feet are warm and well perfused.  CBC    Component Value Date/Time   WBC 6.3 10/17/2023 0342   RBC 3.09 (L) 10/17/2023 0342   HGB 9.5 (L) 10/17/2023 0342   HCT 29.3 (L) 10/17/2023 0342   PLT 161 10/17/2023 0342   MCV 94.8 10/17/2023 0342   MCH 30.7 10/17/2023 0342   MCHC 32.4 10/17/2023 0342   RDW 14.0 10/17/2023 0342   LYMPHSABS 1.1 12/07/2020 1439   MONOABS 0.6 12/07/2020 1439   EOSABS 0.2 12/07/2020 1439   BASOSABS 0.0 12/07/2020 1439    BMET    Component Value Date/Time   NA 139 10/15/2023 0331   K 3.9 10/15/2023 0331   CL 107 10/15/2023 0331   CO2 26 10/15/2023 0331   GLUCOSE 106 (H) 10/15/2023 0331   BUN 13 10/15/2023 0331   CREATININE 0.89 10/15/2023 0331   CALCIUM  8.6 (L) 10/15/2023 0331   GFRNONAA >60 10/15/2023 0331   GFRAA >90 05/15/2014 0457    INR    Component Value Date/Time   INR 1.0 10/10/2023 1249     Intake/Output Summary (Last 24 hours) at 10/17/2023 0648 Last data filed at 10/16/2023 1220 Gross per 24 hour  Intake --  Output 900 ml  Net -900 ml      Assessment/Plan:  78 y.o. female is s/p:  Endovascular aortic aneurysm repair, open exposure right CFA with endarterectomy and vein patch angioplasty 10/12/2023 by Dr. Sheree   5 Days Post-Op   -continue mobilizing and using IS. -still on 3LO2NC-was not on O2 prior to admit.  May need supplemental O2 at discharge.  -hgb improved to 9.5 from 8.4 a couple of days ago. -PT eval yesterday and recommending HHPT-will place face to  face -DVT prophylaxis:  sq heparin    Lucie Apt, PA-C [facility] [phone] 10/17/2023 6:48 AM  I have independently interviewed and examined patient and agree with PA assessment and plan above.   Kalyani Maeda C. Sheree, MD [facility] [place] Office: [phone] Pager: [phone]

## 2023-10-18 NOTE — Progress Notes (Signed)
 Mobility Specialist Progress Note:    10/18/23 1102  Mobility  Activity Ambulated with assistance  Level of Assistance Contact guard assist, steadying assist  Assistive Device Front wheel walker  Distance Ambulated (ft) 250 ft  Activity Response Tolerated well  Mobility Referral Yes  Mobility visit 1 Mobility  Mobility Specialist Start Time (ACUTE ONLY) 1045  Mobility Specialist Stop Time (ACUTE ONLY) 1101  Mobility Specialist Time Calculation (min) (ACUTE ONLY) 16 min   Pt received in bed, agreeable to mobility session. Tolerated well, took 3 standing rest breaks d/t fatigue and SOB, SpO2 91-95% on 3L. Returned pt to room, left with all needs met.   [place] Mobility Specialist Please contact via Special educational needs teacher or  Rehab office at [phone]

## 2023-10-18 NOTE — TOC Progression Note (Signed)
 Transition of Care (TOC) - Progression Note  Rayfield Gobble RN, BSN Inpatient Care Management Unit 4E- RN Case Manager See Treatment Team for direct phone #   Patient Details  Name: Kylie Hurst MRN: [id number] Date of Birth: 12-21-1945  Transition of Care [facility]) CM/SW Contact  Gobble Rayfield Hurst, RN Phone Number: 10/18/2023, 4:56 PM  Clinical Narrative:    Notified by [facility] S. Rhyne PA, that pt will need home 02 and HHPT at ALF. Likely will be ready today to transition back to ALF   Call made to [facility] and spoke with staff regarding pt coming back- they have indicated that pt must be able to get OOB on her own as well as get to the bathroom without assistance in order to return to ALF- otherwise [facility] need [facility] prior to return to ALF level.  They have one caregiver to assist residents per building and limited assistance.  Per [facility] they contract with [facility] for [facility] needs,  No preference on 02 provider.   Msg sent to PT to re-assess pt to make sure she can safely return to ALF at current level of function. Will have to wait for therapy to see pt and then call ALF to confirm pt can return.   IP care management to follow.    Expected Discharge Plan: Assisted Living Barriers to Discharge: Continued Medical Work up               Expected Discharge Plan and Services In-house Referral: Clinical Social Work     Living arrangements for the past 2 months: Assisted Living Facility                                       Social Drivers of Health (SDOH) Interventions SDOH Screenings   Food Insecurity: No Food Insecurity (10/12/2023)  Housing: Low Risk  (10/12/2023)  Transportation Needs: No Transportation Needs (10/12/2023)  Utilities: Not At Risk (10/12/2023)  Depression (PHQ2-9): Low Risk  (06/11/2020)  Social Connections: Moderately Isolated (10/12/2023)  Tobacco Use: Medium Risk (10/12/2023)    Readmission Risk Interventions     No  data to display

## 2023-10-18 NOTE — Progress Notes (Signed)
  Progress Note    10/18/2023 11:52 AM 6 Days Post-Op  Subjective: No overnight complaints  Vitals:   10/18/23 0814 10/18/23 0839  BP: 96/61 94/66  Pulse: (!) 118 (!) 119  Resp: 18 14  Temp: 98.7 F (37.1 C)   SpO2: 98% 95%    Physical Exam: Awake alert and oriented Nonlabored respirations Right groin healing well with staples in place Right dorsalis pedis 2+ and left 1+ which is stable  CBC    Component Value Date/Time   WBC 6.3 10/17/2023 0342   RBC 3.09 (L) 10/17/2023 0342   HGB 9.5 (L) 10/17/2023 0342   HCT 29.3 (L) 10/17/2023 0342   PLT 161 10/17/2023 0342   MCV 94.8 10/17/2023 0342   MCH 30.7 10/17/2023 0342   MCHC 32.4 10/17/2023 0342   RDW 14.0 10/17/2023 0342   LYMPHSABS 1.1 12/07/2020 1439   MONOABS 0.6 12/07/2020 1439   EOSABS 0.2 12/07/2020 1439   BASOSABS 0.0 12/07/2020 1439    BMET    Component Value Date/Time   NA 139 10/15/2023 0331   K 3.9 10/15/2023 0331   CL 107 10/15/2023 0331   CO2 26 10/15/2023 0331   GLUCOSE 106 (H) 10/15/2023 0331   BUN 13 10/15/2023 0331   CREATININE 0.89 10/15/2023 0331   CALCIUM  8.6 (L) 10/15/2023 0331   GFRNONAA >60 10/15/2023 0331   GFRAA >90 05/15/2014 0457    INR    Component Value Date/Time   INR 1.0 10/10/2023 1249     Intake/Output Summary (Last 24 hours) at 10/18/2023 1152 Last data filed at 10/18/2023 0600 Gross per 24 hour  Intake --  Output 350 ml  Net -350 ml     Assessment:  78 y.o. female is s/p EVAR with right common femoral endarterectomy and vein patch angioplasty  Plan: Appears medically okay for discharge, will likely require oxygen for home  Jahlon Baines C. Sheree, MD [facility] [place] Office: [phone] Pager: [phone]  10/18/2023 11:52 AM

## 2023-10-18 NOTE — Discharge Summary (Signed)
 [facility] Discharge Summary   Kylie Hurst 1945-08-06 78 y.o. female  MRN: [id number]  Admission Date: 10/12/2023  Discharge Date: 10/19/2023  Physician: Sheree Penne Millman*  Admission Diagnosis: Infrarenal abdominal aortic aneurysm (AAA) without rupture (HCC) [I71.43] 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 [Z95.828] AAA (abdominal aortic aneurysm) (HCC) [I71.40]   HPI:   This is a 78 y.o. female  with known history of abdominal aortic aneurysm now following up with CT scan.  Her father possibly had an aneurysm but currently unknown.  She denies any new back or abdominal pain.  She does walk although has limitations due to leg pain.  She does have hyperlipidemia, hypertension and is a former smoker.  She does not take any blood thinners she is on aspirin .  Her niece is present with her today states that she has had some increased difficulty breathing with short distance activity currently on inhalers which has been improving.   Hospital Course:  The patient was admitted to the hospital and taken to the operating room on 10/12/2023 and underwent: 1.  Percutaneous bilateral common femoral access and Pro-glide device closure 2.  Endovascular aortic aneurysm repair with main body right 23 x 14 x 12 cm device extended with 11 x 79 mm VBX and contralateral limb left proximal 11 x 59 mm VBX extended with 8L x 79 mm VBX 3.  Right common femoral endarterectomy with vein patch angioplasty via harvested anterior accessory saphenous vein    Findings: The left common femoral artery was calcified we were able to cannulate above this and we placed an 8 French sheath on the left and the left side was percutaneously closed and at completion there was a strong dorsalis pedis on the left.  On the right side there was heavy calcification we were unable to place percutaneous closure devices and ultimately required cutdown with extensive endarterectomy and vein patch angioplasty and  at completion there was strong dorsalis pedis signal.  Aortic aneurysm itself had a 3 cm neck and we achieved a seal proximally.  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  I completion there was proximal and distal seal with no evidence of endoleak and both hypogastric and both renal arteries filled.   The pt tolerated the procedure well and was transported to the PACU in good condition.   By POD 1, she did have soft blood pressure and received fluid bolus and  transfusion.her pedal pulses were palpable.   POD 2, pressure improved.  Incision healing well.   POD 3, Progressive pulmonary edema on cxr today. Hydrochlorothiazide  home dose restarted with 20mg  IV lasix  one time.   POD 4, she did receive an additional dose of lasix . She was able to ambulate well with mobility specialist.  PT recommended HHPT.    POD 5, doing well, incision continues to look good.   POD 6, still requiring oxygen and will need home O2.  SATURATION QUALIFICATIONS: (This note is used to comply with regulatory documentation for home oxygen)   Patient Saturations on Room Air at Rest = 84%   Patient Saturations on Room Air while Ambulating = N/A   Patient Saturations on 3 Liters of oxygen while Ambulating = 88%   Please briefly explain why patient needs home oxygen: Pt hypoxic on RA at rest. Needed at least 3L/min O2 [place] to maintain SpO2 88% and above  POD 7, pedal pulses remain palpable.  Incision healing.  Walked well  with mobility specialist.   CBC    Component Value Date/Time   WBC 6.3 10/17/2023 0342   RBC 3.09 (L) 10/17/2023 0342   HGB 9.5 (L) 10/17/2023 0342   HCT 29.3 (L) 10/17/2023 0342   PLT 161 10/17/2023 0342   MCV 94.8 10/17/2023 0342   MCH 30.7 10/17/2023 0342   MCHC 32.4 10/17/2023 0342   RDW 14.0 10/17/2023 0342   LYMPHSABS 1.1 12/07/2020 1439   MONOABS 0.6 12/07/2020 1439   EOSABS 0.2 12/07/2020 1439   BASOSABS 0.0  12/07/2020 1439    BMET    Component Value Date/Time   NA 139 10/15/2023 0331   K 3.9 10/15/2023 0331   CL 107 10/15/2023 0331   CO2 26 10/15/2023 0331   GLUCOSE 106 (H) 10/15/2023 0331   BUN 13 10/15/2023 0331   CREATININE 0.89 10/15/2023 0331   CALCIUM  8.6 (L) 10/15/2023 0331   GFRNONAA >60 10/15/2023 0331   GFRAA >90 05/15/2014 0457       Discharge Instructions     Discharge patient   Complete by: As directed    Discharge back to her ALF.   Discharge disposition: [facility] Not Defined   Discharge patient date: 10/19/2023       Discharge Diagnosis:  Infrarenal abdominal aortic aneurysm (AAA) without rupture (HCC) [I71.43] 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 [Z95.828] AAA (abdominal aortic aneurysm) (HCC) [I71.40]  Secondary Diagnosis: Patient Active Problem List   Diagnosis Date Noted   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 10/12/2023   AAA (abdominal aortic aneurysm) (HCC) 10/12/2023   Arthritis    Back spasm    Chronic back pain    Depression    Diabetes mellitus without complication (HCC)    Environmental and seasonal allergies    Neuromuscular disorder (HCC)    Onycholysis of toenail    Restless legs    Wears glasses    Preop cardiovascular exam    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   Dementia (HCC) 12/07/2020   Acquired hammer toe of left foot 05/26/2020   Allergic rhinitis due to pollen 05/26/2020   Atherosclerosis 05/26/2020   Benign neoplasm of colon 05/26/2020   Chronic obstructive pulmonary disease, unspecified (HCC) 05/26/2020   Constipation 05/26/2020   Decreased estrogen level 05/26/2020   Hardening of the aorta (main artery of the heart) (HCC) 05/26/2020   Hypercalcemia 05/26/2020   Hyperlipidemia 05/26/2020   Hypertension 05/26/2020   Hypokalemia 05/26/2020   Osteoporosis 05/26/2020   History of colonic polyps 05/26/2020    Rectocele 05/26/2020   Skin sensation disturbance 05/26/2020   Tobacco dependence 05/26/2020   Vaginal bleeding 05/26/2020   Pain due to onychomycosis of toenails of both feet 07/17/2018   Diabetes mellitus (HCC) 07/17/2018   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   Right lumbar radiculopathy 05/30/2013   Diabetic neuropathy (HCC) 05/30/2013   Cervical spondylosis without myelopathy 12/02/2012   Lumbosacral spondylosis without myelopathy 12/02/2012   Spondylolisthesis at L5-S1 level 12/02/2012   AAA (abdominal aortic aneurysm) without rupture (HCC) 07/03/2012   Ovarian cyst 06/01/2009   Aortic aneurysm (HCC) 2011   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   Allergic rhinitis due to pollen 05/26/2020   Aortic aneurysm (HCC) 2011   3.70mm   Arthritis  Atherosclerosis 05/26/2020   Back spasm    hx   Benign neoplasm of colon 05/26/2020   Cervical spondylosis without myelopathy 12/02/2012   Chronic back pain    hx   Chronic obstructive pulmonary disease, unspecified (HCC) 05/26/2020   Constipation 05/26/2020   Decreased estrogen level 05/26/2020   Dementia (HCC)    Depression    Diabetes mellitus (HCC) 07/17/2018   type 2, no meds, does not check blood sugar   Diabetes mellitus without complication (HCC)    fasting 90-110   Diabetic neuropathy (HCC) 05/30/2013   Dyspnea    with exertion   Environmental and seasonal allergies    Hardening of the aorta (main artery of the heart) (HCC) 05/26/2020   History of colonic polyps 05/26/2020   IMO SNOMED Dx Update Oct 2024     Hypercalcemia 05/26/2020   Hyperlipidemia    Hypertension    Hypokalemia 05/26/2020   Lumbosacral spondylosis without myelopathy 12/02/2012   Neuromuscular disorder (HCC)    numbness and tingling feet   Onycholysis of toenail     Osteoporosis    Ovarian cyst 06/01/2009   5 cm   Pain due to onychomycosis of toenails of both feet 07/17/2018   Peripheral vascular disease (HCC)    AAA   Pneumonia    x 1   Preop cardiovascular exam    Rectocele 05/26/2020   Restless legs    Right lumbar radiculopathy 05/30/2013   Skin sensation disturbance 05/26/2020   Spigelian hernia    left ventral   Spondylolisthesis at L5-S1 level 12/02/2012   Stroke (HCC)    hx TIA seen on imaging   Tobacco dependence 05/26/2020   Uses roller walker    Vaginal bleeding 05/26/2020   Wears glasses      Allergies as of 10/19/2023   No Known Allergies      Medication List     STOP taking these medications    Anoro Ellipta  62.5-25 MCG/INH Aepb Generic drug: umeclidinium-vilanterol       TAKE these medications    acetaminophen  500 MG tablet Commonly known as: TYLENOL  Take 1,000 mg by mouth every 4 (four) hours as needed for mild pain (pain score 1-3) or moderate pain (pain score 4-6).   albuterol  108 (90 Base) MCG/ACT inhaler Commonly known as: VENTOLIN  HFA Inhale 1 puff into the lungs every 4 (four) hours as needed for shortness of breath or wheezing.   aspirin  81 MG chewable tablet Commonly known as: Aspirin  Childrens Chew 1 tablet (81 mg total) by mouth daily.   Breztri  Aerosphere 160-9-4.8 MCG/ACT Aero inhaler Generic drug: budesonide -glycopyrrolate -formoterol  Inhale 1 Inhaler into the lungs in the morning and at bedtime.   busPIRone  7.5 MG tablet Commonly known as: BUSPAR  Take 7.5 mg by mouth 3 (three) times daily.   cetirizine 10 MG tablet Commonly known as: ZYRTEC Take 10 mg by mouth daily.   cholecalciferol  25 MCG (1000 UNIT) tablet Commonly known as: VITAMIN D3 Take 2,000 Units by mouth daily.   escitalopram  10 MG tablet Commonly known as: LEXAPRO  Take 10 mg by mouth daily.   fluticasone  50 MCG/ACT nasal spray Commonly known as: FLONASE  Place 1 spray into both nostrils daily.   gabapentin   600 MG tablet Commonly known as: NEURONTIN  Take 300 mg by mouth at bedtime.   hydrochlorothiazide  12.5 MG tablet Commonly known as: HYDRODIURIL  Take 12.5 mg by mouth daily.   Klor-Con  M20 20 MEQ tablet Generic drug: potassium chloride  SA Take 20 mEq by mouth every evening.  lisinopril  5 MG tablet Commonly known as: ZESTRIL  Take 5 mg by mouth at bedtime.   magnesium  oxide 400 MG tablet Commonly known as: MAG-OX Take 1 tablet by mouth daily.   oxyCODONE -acetaminophen  5-325 MG tablet Commonly known as: Percocet Take 1 tablet by mouth every 6 (six) hours as needed.   pravastatin  20 MG tablet Commonly known as: PRAVACHOL  Take 20 mg by mouth at bedtime.               Durable Medical Equipment  (From admission, onward)           Start     Ordered   10/18/23 1154  For home use only DME oxygen  Once       Question Answer Comment  Length of Need 6 Months   Mode or (Route) Nasal cannula   Liters per Minute 3   Frequency Continuous (stationary and portable oxygen unit needed)   Oxygen delivery system Gas      10/18/23 1153            Discharge Instructions:  [facility]  Discharge Instructions Endovascular Aortic Aneurysm Repair  Please refer to the following instructions for your post-procedure care. Your surgeon or Physician Assistant will discuss any changes with you.  Activity  You are encouraged to walk as much as you can. You can slowly return to normal activities but must avoid strenuous activity and heavy lifting until your doctor tells you it's OK. Avoid activities such as vacuuming or swinging a gold club. It is normal to feel tired for several weeks after your surgery. Do not drive until your doctor gives the OK and you are no longer taking prescription pain medications. It is also normal to have difficulty with sleep habits, eating, and bowel movements after surgery. These will go away with  time.  Bathing/Showering  You may shower after you go home. If you have an incision, do not soak in a bathtub, hot tub, or swim until the incision heals completely.  Incision Care  Wash the groin wound with soap and water daily and pat dry. (No tub bath-only shower)  Then put a dry gauze or washcloth there to keep this area dry daily and as needed.  Do not use Vaseline or neosporin on your incisions.  Only use soap and water on your incisions and then protect and keep dry.   Diet  Resume your normal diet. There are no special food restrictions following this procedure. A low fat/low cholesterol diet is recommended for all patients with vascular disease. In order to heal from your surgery, it is CRITICAL to get adequate nutrition. Your body requires vitamins, minerals, and protein. Vegetables are the best source of vitamins and minerals. Vegetables also provide the perfect balance of protein. Processed food has little nutritional value, so try to avoid this.  Medications  Resume taking all of your medications unless your doctor or Physician Assistnat tells you not to. If your incision is causing pain, you may take over-the-counter pain relievers such as acetaminophen  (Tylenol ). If you were prescribed a stronger pain medication, please be aware these medications can cause nausea and constipation. Prevent nausea by taking the medication with a snack or meal. Avoid constipation by drinking plenty of fluids and eating foods with a high amount of fiber, such as fruits, vegetables, and grains.  Do not take Tylenol  if you are taking prescription pain medications.   Follow up  Our office will schedule a follow-up appointment  with a C.T. scan 3-4 weeks after your surgery.  Please call us  immediately for any of the following conditions  Severe or worsening pain in your legs or feet or in your abdomen back or chest. Increased pain, redness, drainage (pus) from your incision sit. Increased abdominal  pain, bloating, nausea, vomiting or persistent diarrhea. Fever of 101 degrees or higher. Swelling in your leg (s),  Reduce your risk of vascular disease  Stop smoking. If you would like help call [facility] at 1-800-QUIT-NOW ((606) 470-3767) or [place] at 7741714724. Manage your cholesterol Maintain a desired weight Control your diabetes Keep your blood pressure down  If you have questions, please call the office at [phone].   Prescriptions given: 1.  Roxicet #8 No Refill sent through [facility]  Disposition: ALF  Patient's condition: is Good  Follow up: 1. Dr. Sheree in  weeks with CTA protocol and staple removal   Lucie Apt, PA-C [facility] [phone] 10/19/2023  12:10 PM   - For VQI Registry use - Post-op:  Time to Extubation: [x]  In OR, [ ]  < 12 hrs, [ ]  12-24 hrs, [ ]  >=24 hrs Vasopressors Req. Post-op: No MI: No., [ ]  Troponin only, [ ]  EKG or Clinical New Arrhythmia: No CHF: Yes ICU Stay: 7 day in progressive Transfusion: Yes     If yes, 1 unit given  Complications: Resp failure: No., [ ]  Pneumonia, [ ]  Ventilator Chg in renal function: No., [ ]  Inc. Cr > 0.5, [ ]  Temp. Dialysis,  [ ]  Permanent dialysis Leg ischemia: No., no Surgery needed, [ ]  Yes, Surgery needed,  [ ]  Amputation Bowel ischemia: No., [ ]  Medical Rx, [ ]  Surgical Rx Wound complication: No., [ ]  Superficial separation/infection, [ ]  Return to OR Return to OR: No  Return to OR for bleeding: No Stroke: No., [ ]  Minor, [ ]  Major  Discharge medications: Statin use:  Yes  ASA use:  Yes  Plavix use:  No  Beta blocker use:  No  ARB use:  No ACEI use:  Yes CCB use:  No

## 2023-10-18 NOTE — Progress Notes (Signed)
  Progress Note    10/18/2023 7:04 AM 6 Days Post-Op  Subjective:  no complaints  Afebrile HR 70's-120's  120's-130's systolic 96% 2LO2NC  Vitals:   10/17/23 2314 10/18/23 0329  BP: 125/68 139/68  Pulse: 71 79  Resp: 20 18  Temp: 97.9 F (36.6 C) 98.7 F (37.1 C)  SpO2: 96% 96%    Physical Exam: General:  resting comfortably Lungs:  non labored Incisions:  right groin looks good with staples in tact Extremities:  bilateral feet are warm Abdomen:  soft  CBC    Component Value Date/Time   WBC 6.3 10/17/2023 0342   RBC 3.09 (L) 10/17/2023 0342   HGB 9.5 (L) 10/17/2023 0342   HCT 29.3 (L) 10/17/2023 0342   PLT 161 10/17/2023 0342   MCV 94.8 10/17/2023 0342   MCH 30.7 10/17/2023 0342   MCHC 32.4 10/17/2023 0342   RDW 14.0 10/17/2023 0342   LYMPHSABS 1.1 12/07/2020 1439   MONOABS 0.6 12/07/2020 1439   EOSABS 0.2 12/07/2020 1439   BASOSABS 0.0 12/07/2020 1439    BMET    Component Value Date/Time   NA 139 10/15/2023 0331   K 3.9 10/15/2023 0331   CL 107 10/15/2023 0331   CO2 26 10/15/2023 0331   GLUCOSE 106 (H) 10/15/2023 0331   BUN 13 10/15/2023 0331   CREATININE 0.89 10/15/2023 0331   CALCIUM  8.6 (L) 10/15/2023 0331   GFRNONAA >60 10/15/2023 0331   GFRAA >90 05/15/2014 0457    INR    Component Value Date/Time   INR 1.0 10/10/2023 1249     Intake/Output Summary (Last 24 hours) at 10/18/2023 0704 Last data filed at 10/18/2023 0600 Gross per 24 hour  Intake --  Output 350 ml  Net -350 ml      Assessment/Plan:  78 y.o. female is s/p:  Endovascular aortic aneurysm repair, open exposure right CFA with endarterectomy and vein patch angioplasty 10/12/2023 by Dr. Sheree   6 Days Post-Op   -pt doing well and ambulating -still requiring O2NC-and tested and will need home O2.  TOC order placed.  -DVT prophylaxis:  sq heparin  -discharge later today to her assisted living facility if home needs can be arranged  Lucie Apt, PA-C [facility] [phone] 10/18/2023 7:04 AM

## 2023-10-19 ENCOUNTER — Other Ambulatory Visit (HOSPITAL_COMMUNITY): Payer: Self-pay

## 2023-10-19 MED ORDER — OXYCODONE-ACETAMINOPHEN 5-325 MG PO TABS
1.0000 | ORAL_TABLET | Freq: Four times a day (QID) | ORAL | 0 refills | Status: AC | PRN
  Filled 2023-10-19: qty 8, 2d supply, fill #0

## 2023-10-19 NOTE — Progress Notes (Addendum)
  Progress Note    10/19/2023 6:55 AM 7 Days Post-Op  Subjective:  no complaints  Afebrile HR 60's-70's 110's-120's systolic 98% 2LO2NC  Vitals:   10/18/23 2335 10/19/23 0355  BP: 111/67 112/66  Pulse: 74 80  Resp: (!) 21 17  Temp: 98 F (36.7 C) 98.6 F (37 C)  SpO2: 95% 97%    Physical Exam: General:  no distress; wakes easily Lungs:  non labored on O2 Extremities:  bilateral feet are warm and well perfused   CBC    Component Value Date/Time   WBC 6.3 10/17/2023 0342   RBC 3.09 (L) 10/17/2023 0342   HGB 9.5 (L) 10/17/2023 0342   HCT 29.3 (L) 10/17/2023 0342   PLT 161 10/17/2023 0342   MCV 94.8 10/17/2023 0342   MCH 30.7 10/17/2023 0342   MCHC 32.4 10/17/2023 0342   RDW 14.0 10/17/2023 0342   LYMPHSABS 1.1 12/07/2020 1439   MONOABS 0.6 12/07/2020 1439   EOSABS 0.2 12/07/2020 1439   BASOSABS 0.0 12/07/2020 1439    BMET    Component Value Date/Time   NA 139 10/15/2023 0331   K 3.9 10/15/2023 0331   CL 107 10/15/2023 0331   CO2 26 10/15/2023 0331   GLUCOSE 106 (H) 10/15/2023 0331   BUN 13 10/15/2023 0331   CREATININE 0.89 10/15/2023 0331   CALCIUM  8.6 (L) 10/15/2023 0331   GFRNONAA >60 10/15/2023 0331   GFRAA >90 05/15/2014 0457    INR    Component Value Date/Time   INR 1.0 10/10/2023 1249     Intake/Output Summary (Last 24 hours) at 10/19/2023 0655 Last data filed at 10/18/2023 2014 Gross per 24 hour  Intake --  Output 500 ml  Net -500 ml      Assessment/Plan:  78 y.o. female is s/p:  Endovascular aortic aneurysm repair, open exposure right CFA with endarterectomy and vein patch angioplasty 10/12/2023 by Dr. Sheree   7 Days Post-Op   -pt doing well this morning.   -hopeful to get back to ALF today.  Awaiting PT to work with pt as she needs to be able to get OOB on her own and to the bathroom without assistance to return to her facility -DVT prophylaxis:  sq heparin  -continue asa/statin   Lucie Apt, PA-C [facility] [phone] 10/19/2023 6:55 AM  I have independently interviewed and examined patient and agree with PA assessment and plan above. DP pulses remain palpable, right groin healing well with staples.   Lasondra Hodgkins C. Sheree, MD [facility] Office: [phone] Pager: [phone]

## 2023-10-19 NOTE — Progress Notes (Signed)
 SATURATION QUALIFICATIONS: (This note is used to comply with regulatory documentation for home oxygen)  Patient Saturations on Room Air at Rest = 100%  Patient Saturations on Room Air while Ambulating = 78%  Patient Saturations on 2 Liters of oxygen while Ambulating = 96%  Please briefly explain why patient needs home oxygen: Pt unable to sustain O2 saturations during mobility putting pt at increased risk for hypoxia and falls.  Jlen Wintle C, OT  Acute [facility] Services Office [phone] Secure chat preferred

## 2023-10-19 NOTE — Progress Notes (Signed)
 Occupational Therapy Treatment Patient Details Name: Kylie Hurst MRN: [id number] DOB: 02-27-1945 Today's Date: 10/19/2023   History of present illness 78 y.o. female presents to [facility] hospital on 10/12/2023 for repair of abdominal aortic aneurysm. Pt underwent endovascular aortic aneurysm repair on 9/12. PMH includes OA, dementia, depression, DM, emphysema, HLD, HTN, RLS, ACDF.   OT comments  Pt progressing well towards goals. Progressed to s for safety with mobility and functional transfers to toilet. Pt with improved activity tolerance, completing 134ft of mobility with RW and toileting. Pt with good self- monitoring requiring x2 rest breaks with mobility and x2 during toileting. Continue to recommend no follow up OT. Will continue to follow acutely.       If plan is discharge home, recommend the following:  A little help with walking and/or transfers;Assistance with cooking/housework   Equipment Recommendations  None recommended by OT       Precautions / Restrictions Precautions Precautions: Fall Recall of Precautions/Restrictions: Intact Precaution/Restrictions Comments: monitor SpO2 Restrictions Weight Bearing Restrictions Per Provider Order: No       Mobility Bed Mobility Overal bed mobility: Modified Independent     General bed mobility comments: No assist, HOB elevated    Transfers Overall transfer level: Needs assistance Equipment used: Rolling walker (2 wheels) Transfers: Sit to/from Stand Sit to Stand: Supervision       General transfer comment: S for safety, no assist pt amb 158ft in hallway with RW. x2 rest breaks     Balance Overall balance assessment: Needs assistance Sitting-balance support: No upper extremity supported, Feet supported Sitting balance-Leahy Scale: Good     Standing balance support: No upper extremity supported, During functional activity Standing balance-Leahy Scale: Fair Standing balance comment: RW for dynamic tasks        ADL either performed or assessed with clinical judgement   ADL Overall ADL's : Needs assistance/impaired     Grooming: Set up;Sitting;Wash/dry hands Grooming Details (indicate cue type and reason): D/t fatigue       Toilet Transfer: Supervision/safety;Ambulation;Regular Toilet;Rolling walker (2 wheels);Grab bars Toilet Transfer Details (indicate cue type and reason): S for safety with lines Toileting- Clothing Manipulation and Hygiene: Supervision/safety;Sit to/from stand Toileting - Clothing Manipulation Details (indicate cue type and reason): Pt with flexed trunk causing increased fatigue requiring x2 rest breaks     Functional mobility during ADLs: Supervision/safety;Rolling walker (2 wheels) General ADL Comments: Largely s for safety with lines. Pt fatigues but self- monitors well    Extremity/Trunk Assessment Upper Extremity Assessment Upper Extremity Assessment: Generalized weakness   Lower Extremity Assessment Lower Extremity Assessment: Defer to PT evaluation        Vision   Vision Assessment?: No apparent visual deficits         Communication Communication Communication: No apparent difficulties   Cognition Arousal: Alert Behavior During Therapy: Agitated Cognition: No apparent impairments             OT - Cognition Comments: Pt able to recall therapist and details from previous session. Agitated but agreeable to session. Pt often reporting I am ready to leave       Following commands: Intact        Cueing   Cueing Techniques: Verbal cues, Gestural cues        General Comments Amb pulse ox done, see additional note for details. RN and MD notified    Pertinent Vitals/ Pain       Pain Assessment Pain Assessment: No/denies pain Pain Intervention(s): Monitored during session  Frequency  Min 2X/week        Progress Toward Goals  OT Goals(current goals can now be found in the care plan section)  Progress towards OT goals:  Progressing toward goals  Acute Rehab OT Goals Patient Stated Goal: To d/c OT Goal Formulation: With patient Time For Goal Achievement: 10/31/23 Potential to Achieve Goals: Good ADL Goals Pt Will Perform Grooming: with modified independence;standing Pt Will Perform Lower Body Dressing: with modified independence;sitting/lateral leans;sit to/from stand Pt Will Transfer to Toilet: with modified independence;ambulating;regular height toilet Pt Will Perform Toileting - Clothing Manipulation and hygiene: with modified independence;sit to/from stand;sitting/lateral leans  Plan         AM-PAC OT 6 Clicks Daily Activity     Outcome Measure   Help from another person eating meals?: None Help from another person taking care of personal grooming?: A Little Help from another person toileting, which includes using toliet, bedpan, or urinal?: A Little Help from another person bathing (including washing, rinsing, drying)?: A Little Help from another person to put on and taking off regular upper body clothing?: A Little Help from another person to put on and taking off regular lower body clothing?: A Little 6 Click Score: 19    End of Session Equipment Utilized During Treatment: Gait belt;Rolling walker (2 wheels);Oxygen  OT Visit Diagnosis: Unsteadiness on feet (R26.81);Other abnormalities of gait and mobility (R26.89);Repeated falls (R29.6);Muscle weakness (generalized) (M62.81)   Activity Tolerance Patient tolerated treatment well   Patient Left in bed;with call bell/phone within reach;with bed alarm set   Nurse Communication Mobility status        Time: 8859-8785 OT Time Calculation (min): 34 min  Charges: OT General Charges $OT Visit: 1 Visit OT Treatments $Self Care/Home Management : 8-22 mins $Therapeutic Activity: 8-22 mins  Adrianne BROCKS, OT  Acute Rehabilitation Services Office [phone] Secure chat preferred   Adrianne GORMAN Savers 10/19/2023, 12:48 PM

## 2023-10-19 NOTE — Progress Notes (Signed)
 Patient brought to the discharge about 5pm after being discharged to leave the building. discharge they had left the building and got to the stop light and the O2 system started beeping    Family took the O2 condenser to the floor and then the floor brought patient to lounge .  Floor nurse plugged up the O2 system to charge.  Was left to charge for about an hour. And patient will go home with family.

## 2023-10-19 NOTE — TOC Transition Note (Addendum)
 Transition of Care [facility]) - Discharge Note   Patient Details  Name: Kylie Hurst MRN: [id number] Date of Birth: [birth year]  Transition of Care [facility]) CM/SW Contact:  Whitfield Campanile, Student-Social Work Phone Number: 10/19/2023, 2:14 PM   Clinical Narrative:     Patient will Discharge to: [facility]  Discharge Date: 10/19/2023  Family Notified: neice  Transport By: Family     Per MD patient is ready for discharge. RN, patient, and facility notified of discharge. Discharge Summary sent to facility. RN given number for report [phone]. Ambulance transport requested for patient.     Social Work Administrator, arts off.  Whitfield Campanile, MSW Intern   Final next level of care: Assisted Living Barriers to Discharge: No Barriers Identified   Patient Goals and CMS Choice            Discharge Placement PASRR number recieved: 10/19/23 Existing PASRR number confirmed : 10/19/23          Patient chooses bed at: Other - please specify in the comment section below: ([facility]) Patient to be transferred to facility by: [facility]AR Name of family member notified: [facility]) Patient and family notified of of transfer: 10/19/23  Discharge Plan and Services Additional resources added to the After Visit Summary for   In-house Referral: Clinical Social Work                                   Social Drivers of Health (SDOH) Interventions SDOH Screenings   Food Insecurity: No Food Insecurity (10/12/2023)  Housing: Low Risk  (10/12/2023)  Transportation Needs: No Transportation Needs (10/12/2023)  Utilities: Not At Risk (10/12/2023)  Depression (PHQ2-9): Low Risk  (06/11/2020)  Social Connections: Moderately Isolated (10/12/2023)  Tobacco Use: Medium Risk (10/12/2023)     Readmission Risk Interventions     No data to display

## 2023-10-19 NOTE — TOC Progression Note (Addendum)
 Transition of Care (TOC) - Progression Note  Rayfield Gobble RN, BSN Inpatient Care Management Unit 4E- RN Case Manager See Treatment Team for direct phone #   Patient Details  Name: Kylie Hurst MRN: [id number] Date of Birth: March 06, 1945  Transition of Care [facility]) CM/SW Contact  Gobble, Rayfield Hurst, RN Phone Number: 10/19/2023, 2:14 PM  Clinical Narrative:    RNCM coordinating with CSW for pt's return to ALF, Per [facility] HH is contracted with [facility]- call made to [facility] liaison for [facility] referral- referral has been accepted and they will follow up for [facility] needs at ALF.   No preference for DME- 02 provider Call made to [facility] liaison for 02 needs.  Per CSW- brother will be transporting pt back to ALF.  [facility] liaison to deliver portable 02 for brother to have for transport, [facility] will contact ALF later today to set up delivery of concentrator to ALF.  Update 1440- Per liaison pt will have portable concentrator for discharge and large concentrator will be delivered on Monday to ALF by [facility] branch that services [place]  CSW coordinating return to ALF.    Expected Discharge Plan: Assisted Living Barriers to Discharge: No Barriers Identified               Expected Discharge Plan and Services In-house Referral: Clinical Social Work Discharge Planning Services: CM Consult Post Acute Care Choice: Durable Medical Equipment, Home Health Living arrangements for the past 2 months: Assisted Living Facility Expected Discharge Date: 10/19/23               DME Arranged: Oxygen DME Agency: [facility] Date DME Agency Contacted: 10/19/23 Time DME Agency Contacted: 1414 Representative spoke with at DME Agency: [facility] Arranged: PT, OT [facility] Agency: [facility] Date [facility] Agency Contacted: 10/19/23 Time HH Agency Contacted: 1300 Representative spoke with at [facility] Agency: [place]   Social Drivers of Health (SDOH) Interventions SDOH Screenings   Food Insecurity:  No Food Insecurity (10/12/2023)  Housing: Low Risk  (10/12/2023)  Transportation Needs: No Transportation Needs (10/12/2023)  Utilities: Not At Risk (10/12/2023)  Depression (PHQ2-9): Low Risk  (06/11/2020)  Social Connections: Moderately Isolated (10/12/2023)  Tobacco Use: Medium Risk (10/12/2023)    Readmission Risk Interventions     No data to display

## 2023-10-19 NOTE — Progress Notes (Signed)
 Physical Therapy Treatment Patient Details Name: Kylie Hurst MRN: [id number] DOB: 1945-06-21 Today's Date: 10/19/2023   History of Present Illness 78 y.o. female presents to [facility] hospital on 10/12/2023 for repair of abdominal aortic aneurysm. Pt underwent endovascular aortic aneurysm repair on 9/12. PMH includes OA, dementia, depression, DM, emphysema, HLD, HTN, RLS, ACDF.    PT Comments  Pt is currently presenting at mod I for bed mobility, sit to stand and short distance in-home transfers of 50 ft with RW. Pt on 2L O2 via [place] throughout session and managed line without assistance. Supervision provided but not required during session. Due to pt current functional status, home set up and available assistance at home recommending skilled physical therapy services 3x/week in order to address strength, balance and functional mobility to decrease risk for falls, injury and re-hospitalization.       If plan is discharge home, recommend the following: Assistance with cooking/housework;Assist for transportation     Equipment Recommendations  None recommended by PT       Precautions / Restrictions Precautions Precautions: Fall Recall of Precautions/Restrictions: Intact Restrictions Weight Bearing Restrictions Per Provider Order: No     Mobility  Bed Mobility Overal bed mobility: Modified Independent Bed Mobility: Supine to Sit, Sit to Supine     Supine to sit: Modified independent (Device/Increase time), HOB elevated, Used rails Sit to supine: Modified independent (Device/Increase time)   General bed mobility comments: No assist, HOB slightly elevated per home set up    Transfers Overall transfer level: Modified independent Equipment used: Rolling walker (2 wheels) Transfers: Sit to/from Stand, Bed to chair/wheelchair/BSC Sit to Stand: Modified independent (Device/Increase time)   Step pivot transfers: Modified independent (Device/Increase time)       General transfer comment:  mod I with RW no hands on or supervision required. Pt was provided supervision but not needed.    Ambulation/Gait Ambulation/Gait assistance: Modified independent (Device/Increase time) Gait Distance (Feet): 50 Feet Assistive device: Rolling walker (2 wheels) Gait Pattern/deviations: Step-through pattern, Decreased stride length Gait velocity: decreased Gait velocity interpretation: 1.31 - 2.62 ft/sec, indicative of limited community ambulator   General Gait Details: O2 sats remained above 88% on 2L O2 for home distance with slightly decreased gait speed.     Balance Overall balance assessment: Mild deficits observed, not formally tested, Modified Independent Sitting-balance support: No upper extremity supported, Feet supported Sitting balance-Leahy Scale: Good     Standing balance support: No upper extremity supported, During functional activity, Bilateral upper extremity supported   Standing balance comment: RW for dynamic tasks, able to stand without UE support washing hands/peri care        Communication Communication Communication: No apparent difficulties  Cognition Arousal: Alert Behavior During Therapy: WFL for tasks assessed/performed   PT - Cognitive impairments: No apparent impairments, History of cognitive impairments     Following commands: Intact      Cueing Cueing Techniques: Verbal cues     General Comments General comments (skin integrity, edema, etc.): Amb pulse ox done, see additional note for details. RN and MD notified      Pertinent Vitals/Pain Pain Assessment Pain Assessment: No/denies pain           PT Goals (current goals can now be found in the care plan section) Acute Rehab PT Goals Patient Stated Goal: to return to independence PT Goal Formulation: With patient Time For Goal Achievement: 10/30/23 Potential to Achieve Goals: Good Additional Goals Additional Goal #1: Pt will report 2/4 DOE or less  when ambulating for >250' to  demonstrate improved activity tolerance Progress towards PT goals: Progressing toward goals    Frequency    Min 2X/week      PT Plan  Continue with current POC        AM-PAC PT 6 Clicks Mobility   Outcome Measure  Help needed turning from your back to your side while in a flat bed without using bedrails?: None Help needed moving from lying on your back to sitting on the side of a flat bed without using bedrails?: None Help needed moving to and from a bed to a chair (including a wheelchair)?: None Help needed standing up from a chair using your arms (e.g., wheelchair or bedside chair)?: None Help needed to walk in hospital room?: None Help needed climbing 3-5 steps with a railing? : A Little 6 Click Score: 23    End of Session Equipment Utilized During Treatment: Gait belt;Oxygen Activity Tolerance: Patient tolerated treatment well Patient left: in bed;with call bell/phone within reach;with bed alarm set Nurse Communication: Mobility status PT Visit Diagnosis: Other abnormalities of gait and mobility (R26.89);Muscle weakness (generalized) (M62.81)     Time: 8753-8682 PT Time Calculation (min) (ACUTE ONLY): 31 min  Charges:    $Therapeutic Activity: 23-37 mins PT General Charges $$ ACUTE PT VISIT: 1 Visit                    Dorothyann Maier, DPT, CLT  Acute Rehabilitation Services Office: [phone] (Secure chat preferred)   Dorothyann VEAR Maier 10/19/2023, 1:28 PM

## 2023-10-19 NOTE — Progress Notes (Signed)
 Mobility Specialist Progress Note:   10/19/23 0943  Mobility  Activity Ambulated with assistance  Level of Assistance Standby assist, set-up cues, supervision of patient - no hands on  Assistive Device Front wheel walker  Distance Ambulated (ft) 200 ft  Activity Response Tolerated well  Mobility Referral Yes  Mobility visit 1 Mobility  Mobility Specialist Start Time (ACUTE ONLY) [id number]  Mobility Specialist Stop Time (ACUTE ONLY) 1011  Mobility Specialist Time Calculation (min) (ACUTE ONLY) 28 min   Pt received in bed, agreeable to mobility session. No physical assistance required during ambulation with RW. Tolerated well, took one standing rest break before returning back to room d/t fatigue. Returned to room, all needs met.    Kaydence Baba Mobility Specialist Please contact via Special educational needs teacher or  Rehab office at [phone]

## 2023-10-19 NOTE — NC FL2 (Addendum)
 [place]  MEDICAID FL2 LEVEL OF CARE FORM     IDENTIFICATION  Patient Name: Kylie Hurst Birthdate: [birth year] Sex: female Admission Date (Current Location): 10/12/2023  [facility] and [place] Number:  Producer, television/film/video and Address:  [facility], 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 10/12/2023   AAA (abdominal aortic aneurysm) (HCC) 10/12/2023   Arthritis    Back spasm    Chronic back pain    Depression    Diabetes mellitus without complication (HCC)    Environmental and seasonal allergies    Neuromuscular disorder (HCC)    Onycholysis of toenail    Restless legs    Wears glasses    Preop cardiovascular exam    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   Dementia (HCC) 12/07/2020   Acquired hammer toe of left foot 05/26/2020   Allergic rhinitis due to pollen 05/26/2020   Atherosclerosis 05/26/2020   Benign neoplasm of colon 05/26/2020   Chronic obstructive pulmonary disease, unspecified (HCC) 05/26/2020   Constipation 05/26/2020   Decreased estrogen level 05/26/2020   Hardening of the aorta (main artery of the heart) (HCC) 05/26/2020   Hypercalcemia 05/26/2020   Hyperlipidemia 05/26/2020   Hypertension 05/26/2020   Hypokalemia 05/26/2020   Osteoporosis 05/26/2020   History of colonic polyps 05/26/2020   Rectocele 05/26/2020   Skin sensation disturbance 05/26/2020   Tobacco  dependence 05/26/2020   Vaginal bleeding 05/26/2020   Pain due to onychomycosis of toenails of both feet 07/17/2018   Diabetes mellitus (HCC) 07/17/2018   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   Right lumbar radiculopathy 05/30/2013   Diabetic neuropathy (HCC) 05/30/2013   Cervical spondylosis without myelopathy 12/02/2012   Lumbosacral spondylosis without myelopathy 12/02/2012   Spondylolisthesis at L5-S1 level 12/02/2012   AAA (abdominal aortic aneurysm) without rupture (HCC) 07/03/2012   Ovarian cyst 06/01/2009   Aortic aneurysm (HCC) 2011    Orientation RESPIRATION BLADDER Height & Weight     Self, Time, Situation, Place  O2 (2L nasal cannula) Continent Weight: 126 lb (57.2 kg) Height:  4' 9 (144.8 cm)  BEHAVIORAL SYMPTOMS/MOOD NEUROLOGICAL BOWEL NUTRITION STATUS      Continent Diet (Regular)  AMBULATORY STATUS COMMUNICATION OF NEEDS Skin   Supervision Verbally Surgical wounds (incision left and right groins)                       Personal Care Assistance Level of Assistance  Bathing, Dressing, Feeding Bathing Assistance: Limited assistance Feeding assistance: Independent Dressing Assistance: Independent     Functional Limitations Info  Sight, Hearing, Speech Sight Info: Impaired (glasses) Hearing Info: Impaired Speech Info: Adequate    SPECIAL CARE FACTORS FREQUENCY  PT (By licensed PT), OT (By licensed OT)     PT Frequency: 2-3x/week OT Frequency: 2-3x/week            Contractures Contractures Info: Not present    Additional Factors Info  Code Status, Allergies Code Status  Info: Full code Allergies Info: no known allergies           Current Medications (10/19/2023):  This is the current hospital active medication list Current Facility-Administered Medications  Medication Dose Route Frequency Provider Last Rate Last Admin   acetaminophen  (TYLENOL ) tablet 325-650 mg  325-650 mg Oral Q4H PRN Rhyne, Samantha J, PA-C   650 mg at 10/13/23 9075   Or    acetaminophen  (TYLENOL ) suppository 325-650 mg  325-650 mg Rectal Q4H PRN Rhyne, Samantha J, PA-C       albuterol  (PROVENTIL ) (2.5 MG/3ML) 0.083% nebulizer solution 3 mL  3 mL Inhalation Q4H PRN Rhyne, Samantha J, PA-C       aspirin  chewable tablet 81 mg  81 mg Oral Daily Rhyne, Samantha J, PA-C   81 mg at 10/19/23 9063   bisacodyl  (DULCOLAX) suppository 10 mg  10 mg Rectal Daily PRN Rhyne, Samantha J, PA-C       budesonide -glycopyrrolate -formoterol  (BREZTRI ) 160-9-4.8 MCG/ACT inhaler 2 puff  2 puff Inhalation BID AC & HS Rhyne, Samantha J, PA-C   2 puff at 10/19/23 0840   busPIRone  (BUSPAR ) tablet 7.5 mg  7.5 mg Oral TID Rhyne, Samantha J, PA-C   7.5 mg at 10/19/23 0935   cholecalciferol  (VITAMIN D3) 25 MCG (1000 UNIT) tablet 2,000 Units  2,000 Units Oral Daily Rhyne, Samantha J, PA-C   2,000 Units at 10/19/23 0935   docusate sodium  (COLACE) capsule 100 mg  100 mg Oral Daily Rhyne, Samantha J, PA-C   100 mg at 10/19/23 9063   escitalopram  (LEXAPRO ) tablet 10 mg  10 mg Oral Daily Rhyne, Samantha J, PA-C   10 mg at 10/19/23 9062   fluticasone  (FLONASE ) 50 MCG/ACT nasal spray 1 spray  1 spray Each Nare Daily Rhyne, Samantha J, PA-C   1 spray at 10/19/23 0936   gabapentin  (NEURONTIN ) capsule 300 mg  300 mg Oral QHS Rhyne, Samantha J, PA-C   300 mg at 10/18/23 2118   heparin  injection 5,000 Units  5,000 Units Subcutaneous Q8H Rhyne, Samantha J, PA-C   5,000 Units at 10/19/23 0531   hydrochlorothiazide  (HYDRODIURIL ) tablet 12.5 mg  12.5 mg Oral Daily Sheree Penne Bruckner, MD   12.5 mg at 10/19/23 0935   HYDROmorphone  (DILAUDID ) injection 0.5 mg  0.5 mg Intravenous Q3H PRN Rhyne, Samantha J, PA-C       loratadine  (CLARITIN ) tablet 10 mg  10 mg Oral Daily Rhyne, Samantha J, PA-C   10 mg at 10/19/23 9063   magnesium  oxide (MAG-OX) tablet 400 mg  400 mg Oral Daily Rhyne, Samantha J, PA-C   400 mg at 10/19/23 9063   metoprolol  tartrate (LOPRESSOR ) injection 2.5-5 mg  2.5-5 mg Intravenous Q2H PRN Rhyne,  Samantha J, PA-C       oxyCODONE -acetaminophen  (PERCOCET/ROXICET) 5-325 MG per tablet 1-2 tablet  1-2 tablet Oral Q4H PRN Rhyne, Samantha J, PA-C   1 tablet at 10/18/23 2121   pantoprazole  (PROTONIX ) EC tablet 40 mg  40 mg Oral Q supper Rhyne, Samantha J, PA-C   40 mg at 10/18/23 1707   phenol (CHLORASEPTIC) mouth spray 1 spray  1 spray Mouth/Throat PRN Rhyne, Samantha J, PA-C       polyethylene glycol (MIRALAX  / GLYCOLAX ) packet 17 g  17 g Oral Daily PRN Rhyne, Samantha J, PA-C   17 g at 10/18/23 1205   potassium chloride  SA (KLOR-CON  M) CR tablet 40-60 mEq  40-60 mEq Oral Daily PRN Rhyne, Samantha J, PA-C  pravastatin  (PRAVACHOL ) tablet 20 mg  20 mg Oral QHS Rhyne, Samantha J, PA-C   20 mg at 10/18/23 2118     Discharge Medications:  acetaminophen  500 MG tablet Commonly known as: TYLENOL  Take 1,000 mg by mouth every 4 (four) hours as needed for mild pain (pain score 1-3) or moderate pain (pain score 4-6).    albuterol  108 (90 Base) MCG/ACT inhaler Commonly known as: VENTOLIN  HFA Inhale 1 puff into the lungs every 4 (four) hours as needed for shortness of breath or wheezing.    aspirin  81 MG chewable tablet Commonly known as: Aspirin  Childrens Chew 1 tablet (81 mg total) by mouth daily.    Breztri  Aerosphere 160-9-4.8 MCG/ACT Aero inhaler Generic drug: budesonide -glycopyrrolate -formoterol  Inhale 1 Inhaler into the lungs in the morning and at bedtime.    busPIRone  7.5 MG tablet Commonly known as: BUSPAR  Take 7.5 mg by mouth 3 (three) times daily.    cetirizine 10 MG tablet Commonly known as: ZYRTEC Take 10 mg by mouth daily.    cholecalciferol  25 MCG (1000 UNIT) tablet Commonly known as: VITAMIN D3 Take 2,000 Units by mouth daily.    escitalopram  10 MG tablet Commonly known as: LEXAPRO  Take 10 mg by mouth daily.    fluticasone  50 MCG/ACT nasal spray Commonly known as: FLONASE  Place 1 spray into both nostrils daily.    gabapentin  600 MG tablet Commonly known as:  NEURONTIN  Take 300 mg by mouth at bedtime.    hydrochlorothiazide  12.5 MG tablet Commonly known as: HYDRODIURIL  Take 12.5 mg by mouth daily.    Klor-Con  M20 20 MEQ tablet Generic drug: potassium chloride  SA Take 20 mEq by mouth every evening.    lisinopril  5 MG tablet Commonly known as: ZESTRIL  Take 5 mg by mouth at bedtime.    magnesium  oxide 400 MG tablet Commonly known as: MAG-OX Take 1 tablet by mouth daily.    oxyCODONE -acetaminophen  5-325 MG tablet Commonly known as: Percocet Take 1 tablet by mouth every 6 (six) hours as needed.    pravastatin  20 MG tablet Commonly known as: PRAVACHOL  Take 20 mg by mouth at bedtime.    Relevant Imaging Results:  Relevant Lab Results:   Additional Information SS # [id number]  Montie LOISE Louder, LCSW

## 2023-10-19 NOTE — Progress Notes (Signed)
 Reviewed AVS, patient expressed understanding of medications, MD follow up reviewed.   See LDA for information on wounds at discharge. Patient states all belongings brought to the hospital at time of admission are accounted for and packed to take home.  Picked up medications from [facility] pharmacy. This nurse escorted patient to entrance A where family member was waiting in vehicle to transport to Assisted Living Facility.

## 2023-10-22 ENCOUNTER — Other Ambulatory Visit: Payer: Self-pay

## 2023-10-22 DIAGNOSIS — Z48812 Encounter for surgical aftercare following surgery on the circulatory system: Secondary | ICD-10-CM | POA: Diagnosis not present

## 2023-10-22 DIAGNOSIS — M47817 Spondylosis without myelopathy or radiculopathy, lumbosacral region: Secondary | ICD-10-CM | POA: Diagnosis not present

## 2023-10-22 DIAGNOSIS — M2042 Other hammer toe(s) (acquired), left foot: Secondary | ICD-10-CM | POA: Diagnosis not present

## 2023-10-22 DIAGNOSIS — M5416 Radiculopathy, lumbar region: Secondary | ICD-10-CM | POA: Diagnosis not present

## 2023-10-22 DIAGNOSIS — Z79899 Other long term (current) drug therapy: Secondary | ICD-10-CM | POA: Diagnosis not present

## 2023-10-22 DIAGNOSIS — G2581 Restless legs syndrome: Secondary | ICD-10-CM | POA: Diagnosis not present

## 2023-10-22 DIAGNOSIS — J301 Allergic rhinitis due to pollen: Secondary | ICD-10-CM | POA: Diagnosis not present

## 2023-10-22 DIAGNOSIS — Z556 Problems related to health literacy: Secondary | ICD-10-CM | POA: Diagnosis not present

## 2023-10-22 DIAGNOSIS — E1140 Type 2 diabetes mellitus with diabetic neuropathy, unspecified: Secondary | ICD-10-CM | POA: Diagnosis not present

## 2023-10-22 DIAGNOSIS — M47812 Spondylosis without myelopathy or radiculopathy, cervical region: Secondary | ICD-10-CM | POA: Diagnosis not present

## 2023-10-22 DIAGNOSIS — E785 Hyperlipidemia, unspecified: Secondary | ICD-10-CM | POA: Diagnosis not present

## 2023-10-22 DIAGNOSIS — I10 Essential (primary) hypertension: Secondary | ICD-10-CM | POA: Diagnosis not present

## 2023-10-22 DIAGNOSIS — J439 Emphysema, unspecified: Secondary | ICD-10-CM | POA: Diagnosis not present

## 2023-10-22 DIAGNOSIS — M810 Age-related osteoporosis without current pathological fracture: Secondary | ICD-10-CM | POA: Diagnosis not present

## 2023-10-22 DIAGNOSIS — M1990 Unspecified osteoarthritis, unspecified site: Secondary | ICD-10-CM | POA: Diagnosis not present

## 2023-10-22 DIAGNOSIS — I7143 Infrarenal abdominal aortic aneurysm, without rupture: Secondary | ICD-10-CM

## 2023-10-22 DIAGNOSIS — G709 Myoneural disorder, unspecified: Secondary | ICD-10-CM | POA: Diagnosis not present

## 2023-10-22 DIAGNOSIS — Z7982 Long term (current) use of aspirin: Secondary | ICD-10-CM | POA: Diagnosis not present

## 2023-10-22 DIAGNOSIS — Z7951 Long term (current) use of inhaled steroids: Secondary | ICD-10-CM | POA: Diagnosis not present

## 2023-10-22 DIAGNOSIS — Z95828 Presence of other vascular implants and grafts: Secondary | ICD-10-CM | POA: Diagnosis not present

## 2023-10-22 DIAGNOSIS — I700 Atherosclerosis of aorta: Secondary | ICD-10-CM | POA: Diagnosis not present

## 2023-10-23 DIAGNOSIS — J449 Chronic obstructive pulmonary disease, unspecified: Secondary | ICD-10-CM | POA: Diagnosis not present

## 2023-10-24 DIAGNOSIS — J440 Chronic obstructive pulmonary disease with acute lower respiratory infection: Secondary | ICD-10-CM | POA: Diagnosis not present

## 2023-10-24 DIAGNOSIS — G8929 Other chronic pain: Secondary | ICD-10-CM | POA: Diagnosis not present

## 2023-10-24 DIAGNOSIS — I714 Abdominal aortic aneurysm, without rupture, unspecified: Secondary | ICD-10-CM | POA: Diagnosis not present

## 2023-10-24 DIAGNOSIS — Z95828 Presence of other vascular implants and grafts: Secondary | ICD-10-CM | POA: Diagnosis not present

## 2023-10-24 DIAGNOSIS — G301 Alzheimer's disease with late onset: Secondary | ICD-10-CM | POA: Diagnosis not present

## 2023-10-24 DIAGNOSIS — I10 Essential (primary) hypertension: Secondary | ICD-10-CM | POA: Diagnosis not present

## 2023-10-24 DIAGNOSIS — I7143 Infrarenal abdominal aortic aneurysm, without rupture: Secondary | ICD-10-CM | POA: Diagnosis not present

## 2023-10-24 DIAGNOSIS — R911 Solitary pulmonary nodule: Secondary | ICD-10-CM | POA: Diagnosis not present

## 2023-10-26 ENCOUNTER — Ambulatory Visit
Admission: RE | Admit: 2023-10-26 | Discharge: 2023-10-26 | Disposition: A | Discharge status: Home / Self Care | Source: Ambulatory Visit | Attending: Vascular Surgery | Admitting: Vascular Surgery

## 2023-10-26 DIAGNOSIS — K8020 Calculus of gallbladder without cholecystitis without obstruction: Secondary | ICD-10-CM | POA: Diagnosis not present

## 2023-10-26 DIAGNOSIS — I7143 Infrarenal abdominal aortic aneurysm, without rupture: Secondary | ICD-10-CM

## 2023-10-26 MED ORDER — IOPAMIDOL (ISOVUE-370) INJECTION 76%
100.0000 mL | Freq: Once | INTRAVENOUS | Status: AC | PRN
  Administered 2023-10-26: 100 mL via INTRAVENOUS

## 2023-10-30 DIAGNOSIS — J449 Chronic obstructive pulmonary disease, unspecified: Secondary | ICD-10-CM | POA: Diagnosis not present

## 2023-10-30 DIAGNOSIS — N183 Chronic kidney disease, stage 3 unspecified: Secondary | ICD-10-CM | POA: Diagnosis not present

## 2023-10-30 DIAGNOSIS — I714 Abdominal aortic aneurysm, without rupture, unspecified: Secondary | ICD-10-CM | POA: Diagnosis not present

## 2023-10-30 DIAGNOSIS — J9611 Chronic respiratory failure with hypoxia: Secondary | ICD-10-CM | POA: Diagnosis not present

## 2023-11-02 ENCOUNTER — Encounter: Payer: Self-pay | Admitting: *Deleted

## 2023-11-02 NOTE — Progress Notes (Signed)
 Kylie Hurst                                          MRN: [id number]   11/02/2023   The [facility] Quality Team Specialist reviewed this patient medical record for the purposes of chart review for care gap closure. The following were reviewed: chart review for care gap closure-kidney health evaluation for diabetes:eGFR  and uACR.    [facility] Quality Team

## 2023-11-07 ENCOUNTER — Encounter: Payer: Self-pay | Admitting: Vascular Surgery

## 2023-11-07 ENCOUNTER — Ambulatory Visit: Attending: Vascular Surgery | Admitting: Vascular Surgery

## 2023-11-07 VITALS — BP 126/78 | HR 60 | Temp 98.2°F

## 2023-11-07 DIAGNOSIS — I7143 Infrarenal abdominal aortic aneurysm, without rupture: Secondary | ICD-10-CM

## 2023-11-07 NOTE — Progress Notes (Signed)
     Subjective:     Patient ID: Kylie Hurst, female   DOB: 08/03/1945, 78 y.o.   MRN: [id number]  HPI 78 year old female follows up after EVAR for abdominal aortic aneurysm required cutdown of the right common femoral artery with patch angioplasty repair of the right common femoral artery.  She is recovering well remains on oxygen at her group home.   Review of Systems As above    Objective:   Physical Exam Vitals:   11/07/23 0902  BP: 126/78  Pulse: 60  Temp: 98.2 F (36.8 C)  SpO2: 97%   Awake alert oriented Nonlabored aspirations Right groin healing well and staples were removed 2+ palpable dorsalis pedis pulses bilaterally   CT IMPRESSION: 1. Interval aortobiiliac stent endograft repair of infrarenal abdominal aortic aneurysm, excluded aneurysm sac not significantly changed at 4.8 x 4.4 cm. Internal calcific density within the excluded aneurysm sac without evidence of endoleak. 2. Evidence of right common femoral vascular access with overlying incision, fat stranding, and hematoma or seroma without evidence of contrast extravasation or pseudoaneurysm. 3. Cholelithiasis.    Assessment/plan     78 year old female status post EVAR requiring right common femoral cutdown and repair now recovering well with exclusion of aneurysm by CTA.  Plan will be for follow-up in 1 year with duplex.  All questions were answered and she demonstrates good understanding.     Kaelene Elliston C. Sheree, MD [facility] Office: [phone] Pager: [phone]
